# Patient Record
Sex: Male | Born: 1960 | Race: White | Hispanic: No | Marital: Married | Smoking: Former smoker
Health system: Southern US, Community
[De-identification: ages and names within clinical notes are randomized; demographics above are authoritative.]

## PROBLEM LIST (undated history)

## (undated) DIAGNOSIS — K219 Gastro-esophageal reflux disease without esophagitis: Secondary | ICD-10-CM

## (undated) DIAGNOSIS — C61 Malignant neoplasm of prostate: Secondary | ICD-10-CM

## (undated) DIAGNOSIS — G8220 Paraplegia, unspecified: Secondary | ICD-10-CM

## (undated) DIAGNOSIS — N179 Acute kidney failure, unspecified: Secondary | ICD-10-CM

## (undated) DIAGNOSIS — J189 Pneumonia, unspecified organism: Secondary | ICD-10-CM

## (undated) DIAGNOSIS — Z8719 Personal history of other diseases of the digestive system: Secondary | ICD-10-CM

## (undated) DIAGNOSIS — G8929 Other chronic pain: Secondary | ICD-10-CM

## (undated) DIAGNOSIS — M8660 Other chronic osteomyelitis, unspecified site: Secondary | ICD-10-CM

## (undated) DIAGNOSIS — M545 Low back pain, unspecified: Secondary | ICD-10-CM

## (undated) DIAGNOSIS — Z9289 Personal history of other medical treatment: Secondary | ICD-10-CM

## (undated) DIAGNOSIS — G9511 Acute infarction of spinal cord (embolic) (nonembolic): Secondary | ICD-10-CM

## (undated) DIAGNOSIS — F419 Anxiety disorder, unspecified: Secondary | ICD-10-CM

## (undated) DIAGNOSIS — I10 Essential (primary) hypertension: Secondary | ICD-10-CM

## (undated) DIAGNOSIS — I82403 Acute embolism and thrombosis of unspecified deep veins of lower extremity, bilateral: Secondary | ICD-10-CM

## (undated) HISTORY — PX: BACK SURGERY: SHX140

---
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
## 2003-08-15 HISTORY — PX: INSERTION PROSTATE RADIATION SEED: SUR718

## 2006-07-14 HISTORY — PX: LACERATION REPAIR: SHX5168

## 2010-11-13 DIAGNOSIS — J189 Pneumonia, unspecified organism: Secondary | ICD-10-CM

## 2010-11-13 DIAGNOSIS — I82403 Acute embolism and thrombosis of unspecified deep veins of lower extremity, bilateral: Secondary | ICD-10-CM

## 2010-11-13 HISTORY — DX: Acute embolism and thrombosis of unspecified deep veins of lower extremity, bilateral: I82.403

## 2010-11-13 HISTORY — DX: Pneumonia, unspecified organism: J18.9

## 2010-11-13 HISTORY — PX: VENA CAVA FILTER PLACEMENT: SUR1032

## 2011-01-12 DIAGNOSIS — G8254 Quadriplegia, C5-C7 incomplete: Secondary | ICD-10-CM | POA: Insufficient documentation

## 2011-12-26 DIAGNOSIS — G609 Hereditary and idiopathic neuropathy, unspecified: Secondary | ICD-10-CM | POA: Insufficient documentation

## 2012-03-14 HISTORY — PX: PERIPHERALLY INSERTED CENTRAL CATHETER INSERTION: SHX2221

## 2012-05-01 ENCOUNTER — Inpatient Hospital Stay (HOSPITAL_COMMUNITY)
Admission: AD | Admit: 2012-05-01 | Discharge: 2012-05-10 | DRG: 871 | Disposition: A | Discharge status: Home Health | Payer: Medicaid Other | Source: Other Acute Inpatient Hospital | Attending: Internal Medicine | Admitting: Internal Medicine

## 2012-05-01 ENCOUNTER — Encounter (HOSPITAL_COMMUNITY): Payer: Self-pay | Admitting: Internal Medicine

## 2012-05-01 ENCOUNTER — Inpatient Hospital Stay (HOSPITAL_COMMUNITY): Payer: Medicaid Other

## 2012-05-01 DIAGNOSIS — F319 Bipolar disorder, unspecified: Secondary | ICD-10-CM | POA: Diagnosis present

## 2012-05-01 DIAGNOSIS — G9511 Acute infarction of spinal cord (embolic) (nonembolic): Secondary | ICD-10-CM | POA: Insufficient documentation

## 2012-05-01 DIAGNOSIS — IMO0002 Reserved for concepts with insufficient information to code with codable children: Secondary | ICD-10-CM | POA: Diagnosis present

## 2012-05-01 DIAGNOSIS — D72829 Elevated white blood cell count, unspecified: Secondary | ICD-10-CM | POA: Diagnosis present

## 2012-05-01 DIAGNOSIS — R9431 Abnormal electrocardiogram [ECG] [EKG]: Secondary | ICD-10-CM | POA: Diagnosis not present

## 2012-05-01 DIAGNOSIS — Y929 Unspecified place or not applicable: Secondary | ICD-10-CM

## 2012-05-01 DIAGNOSIS — Y92009 Unspecified place in unspecified non-institutional (private) residence as the place of occurrence of the external cause: Secondary | ICD-10-CM

## 2012-05-01 DIAGNOSIS — I959 Hypotension, unspecified: Secondary | ICD-10-CM

## 2012-05-01 DIAGNOSIS — F1011 Alcohol abuse, in remission: Secondary | ICD-10-CM | POA: Diagnosis present

## 2012-05-01 DIAGNOSIS — R0902 Hypoxemia: Secondary | ICD-10-CM | POA: Diagnosis not present

## 2012-05-01 DIAGNOSIS — K592 Neurogenic bowel, not elsewhere classified: Secondary | ICD-10-CM | POA: Diagnosis present

## 2012-05-01 DIAGNOSIS — F1410 Cocaine abuse, uncomplicated: Secondary | ICD-10-CM | POA: Diagnosis present

## 2012-05-01 DIAGNOSIS — E869 Volume depletion, unspecified: Secondary | ICD-10-CM | POA: Diagnosis present

## 2012-05-01 DIAGNOSIS — R7881 Bacteremia: Secondary | ICD-10-CM | POA: Diagnosis present

## 2012-05-01 DIAGNOSIS — T80211A Bloodstream infection due to central venous catheter, initial encounter: Secondary | ICD-10-CM | POA: Diagnosis present

## 2012-05-01 DIAGNOSIS — G8220 Paraplegia, unspecified: Secondary | ICD-10-CM

## 2012-05-01 DIAGNOSIS — T300 Burn of unspecified body region, unspecified degree: Secondary | ICD-10-CM | POA: Diagnosis present

## 2012-05-01 DIAGNOSIS — Z9119 Patient's noncompliance with other medical treatment and regimen: Secondary | ICD-10-CM

## 2012-05-01 DIAGNOSIS — N319 Neuromuscular dysfunction of bladder, unspecified: Secondary | ICD-10-CM | POA: Diagnosis present

## 2012-05-01 DIAGNOSIS — A419 Sepsis, unspecified organism: Principal | ICD-10-CM | POA: Diagnosis present

## 2012-05-01 DIAGNOSIS — T24119A Burn of first degree of unspecified thigh, initial encounter: Secondary | ICD-10-CM | POA: Diagnosis present

## 2012-05-01 DIAGNOSIS — X088XXA Exposure to other specified smoke, fire and flames, initial encounter: Secondary | ICD-10-CM | POA: Diagnosis present

## 2012-05-01 DIAGNOSIS — R110 Nausea: Secondary | ICD-10-CM | POA: Diagnosis not present

## 2012-05-01 DIAGNOSIS — D696 Thrombocytopenia, unspecified: Secondary | ICD-10-CM | POA: Diagnosis present

## 2012-05-01 DIAGNOSIS — Z781 Physical restraint status: Secondary | ICD-10-CM | POA: Diagnosis not present

## 2012-05-01 DIAGNOSIS — R7309 Other abnormal glucose: Secondary | ICD-10-CM | POA: Diagnosis not present

## 2012-05-01 DIAGNOSIS — R509 Fever, unspecified: Secondary | ICD-10-CM | POA: Diagnosis present

## 2012-05-01 DIAGNOSIS — M8618 Other acute osteomyelitis, other site: Secondary | ICD-10-CM | POA: Diagnosis present

## 2012-05-01 DIAGNOSIS — A4902 Methicillin resistant Staphylococcus aureus infection, unspecified site: Secondary | ICD-10-CM | POA: Diagnosis present

## 2012-05-01 DIAGNOSIS — L89109 Pressure ulcer of unspecified part of back, unspecified stage: Secondary | ICD-10-CM | POA: Diagnosis present

## 2012-05-01 DIAGNOSIS — L89154 Pressure ulcer of sacral region, stage 4: Secondary | ICD-10-CM | POA: Diagnosis present

## 2012-05-01 DIAGNOSIS — Z91199 Patient's noncompliance with other medical treatment and regimen due to unspecified reason: Secondary | ICD-10-CM

## 2012-05-01 DIAGNOSIS — Z86718 Personal history of other venous thrombosis and embolism: Secondary | ICD-10-CM

## 2012-05-01 DIAGNOSIS — Z978 Presence of other specified devices: Secondary | ICD-10-CM

## 2012-05-01 DIAGNOSIS — Z7982 Long term (current) use of aspirin: Secondary | ICD-10-CM

## 2012-05-01 DIAGNOSIS — R1310 Dysphagia, unspecified: Secondary | ICD-10-CM | POA: Diagnosis not present

## 2012-05-01 DIAGNOSIS — G8929 Other chronic pain: Secondary | ICD-10-CM | POA: Diagnosis present

## 2012-05-01 DIAGNOSIS — D638 Anemia in other chronic diseases classified elsewhere: Secondary | ICD-10-CM | POA: Diagnosis present

## 2012-05-01 DIAGNOSIS — T368X5A Adverse effect of other systemic antibiotics, initial encounter: Secondary | ICD-10-CM | POA: Diagnosis present

## 2012-05-01 DIAGNOSIS — N179 Acute kidney failure, unspecified: Secondary | ICD-10-CM | POA: Diagnosis present

## 2012-05-01 DIAGNOSIS — F17200 Nicotine dependence, unspecified, uncomplicated: Secondary | ICD-10-CM | POA: Diagnosis present

## 2012-05-01 DIAGNOSIS — K219 Gastro-esophageal reflux disease without esophagitis: Secondary | ICD-10-CM | POA: Diagnosis present

## 2012-05-01 DIAGNOSIS — K5900 Constipation, unspecified: Secondary | ICD-10-CM | POA: Diagnosis not present

## 2012-05-01 DIAGNOSIS — Z791 Long term (current) use of non-steroidal anti-inflammatories (NSAID): Secondary | ICD-10-CM

## 2012-05-01 DIAGNOSIS — D649 Anemia, unspecified: Secondary | ICD-10-CM

## 2012-05-01 DIAGNOSIS — Y9210 Unspecified residential institution as the place of occurrence of the external cause: Secondary | ICD-10-CM | POA: Diagnosis present

## 2012-05-01 DIAGNOSIS — F29 Unspecified psychosis not due to a substance or known physiological condition: Secondary | ICD-10-CM | POA: Diagnosis present

## 2012-05-01 DIAGNOSIS — D509 Iron deficiency anemia, unspecified: Secondary | ICD-10-CM | POA: Diagnosis present

## 2012-05-01 DIAGNOSIS — Z792 Long term (current) use of antibiotics: Secondary | ICD-10-CM

## 2012-05-01 DIAGNOSIS — R1013 Epigastric pain: Secondary | ICD-10-CM | POA: Diagnosis not present

## 2012-05-01 DIAGNOSIS — L8994 Pressure ulcer of unspecified site, stage 4: Secondary | ICD-10-CM | POA: Diagnosis present

## 2012-05-01 DIAGNOSIS — R404 Transient alteration of awareness: Secondary | ICD-10-CM | POA: Diagnosis not present

## 2012-05-01 DIAGNOSIS — F22 Delusional disorders: Secondary | ICD-10-CM | POA: Diagnosis present

## 2012-05-01 DIAGNOSIS — F40298 Other specified phobia: Secondary | ICD-10-CM | POA: Diagnosis present

## 2012-05-01 DIAGNOSIS — B3749 Other urogenital candidiasis: Secondary | ICD-10-CM | POA: Diagnosis present

## 2012-05-01 DIAGNOSIS — M4620 Osteomyelitis of vertebra, site unspecified: Secondary | ICD-10-CM

## 2012-05-01 DIAGNOSIS — Z8739 Personal history of other diseases of the musculoskeletal system and connective tissue: Secondary | ICD-10-CM | POA: Diagnosis present

## 2012-05-01 DIAGNOSIS — Y849 Medical procedure, unspecified as the cause of abnormal reaction of the patient, or of later complication, without mention of misadventure at the time of the procedure: Secondary | ICD-10-CM | POA: Diagnosis present

## 2012-05-01 DIAGNOSIS — R4182 Altered mental status, unspecified: Secondary | ICD-10-CM | POA: Diagnosis not present

## 2012-05-01 DIAGNOSIS — M8668 Other chronic osteomyelitis, other site: Secondary | ICD-10-CM | POA: Diagnosis present

## 2012-05-01 DIAGNOSIS — I69965 Other paralytic syndrome following unspecified cerebrovascular disease, bilateral: Secondary | ICD-10-CM

## 2012-05-01 DIAGNOSIS — M86659 Other chronic osteomyelitis, unspecified thigh: Secondary | ICD-10-CM

## 2012-05-01 HISTORY — DX: Essential (primary) hypertension: I10

## 2012-05-01 HISTORY — DX: Acute kidney failure, unspecified: N17.9

## 2012-05-01 HISTORY — DX: Low back pain, unspecified: M54.50

## 2012-05-01 HISTORY — DX: Paraplegia, unspecified: G82.20

## 2012-05-01 HISTORY — DX: Acute embolism and thrombosis of unspecified deep veins of lower extremity, bilateral: I82.403

## 2012-05-01 HISTORY — DX: Anxiety disorder, unspecified: F41.9

## 2012-05-01 HISTORY — DX: Malignant neoplasm of prostate: C61

## 2012-05-01 HISTORY — DX: Pneumonia, unspecified organism: J18.9

## 2012-05-01 HISTORY — DX: Personal history of other diseases of the digestive system: Z87.19

## 2012-05-01 HISTORY — DX: Acute infarction of spinal cord (embolic) (nonembolic): G95.11

## 2012-05-01 HISTORY — DX: Other chronic osteomyelitis, unspecified site: M86.60

## 2012-05-01 HISTORY — DX: Other chronic pain: G89.29

## 2012-05-01 HISTORY — DX: Low back pain: M54.5

## 2012-05-01 HISTORY — DX: Gastro-esophageal reflux disease without esophagitis: K21.9

## 2012-05-01 HISTORY — DX: Personal history of other medical treatment: Z92.89

## 2012-05-01 LAB — URINE MICROSCOPIC-ADD ON

## 2012-05-01 LAB — CBC WITH DIFFERENTIAL/PLATELET
Basophils Absolute: 0.1 10*3/uL (ref 0.0–0.1)
Basophils Relative: 0 % (ref 0–1)
Eosinophils Absolute: 0.2 10*3/uL (ref 0.0–0.7)
Eosinophils Relative: 2 % (ref 0–5)
HCT: 19.1 % — ABNORMAL LOW (ref 39.0–52.0)
Hemoglobin: 6.7 g/dL — CL (ref 13.0–17.0)
Lymphocytes Relative: 15 % (ref 12–46)
Lymphs Abs: 1.9 10*3/uL (ref 0.7–4.0)
MCH: 26.7 pg (ref 26.0–34.0)
MCHC: 35.1 g/dL (ref 30.0–36.0)
MCV: 76.1 fL — ABNORMAL LOW (ref 78.0–100.0)
Monocytes Absolute: 1.3 10*3/uL — ABNORMAL HIGH (ref 0.1–1.0)
Monocytes Relative: 10 % (ref 3–12)
Neutro Abs: 9.4 10*3/uL — ABNORMAL HIGH (ref 1.7–7.7)
Neutrophils Relative %: 73 % (ref 43–77)
Platelets: 21 10*3/uL — CL (ref 150–400)
RBC: 2.51 MIL/uL — ABNORMAL LOW (ref 4.22–5.81)
RDW: 20.6 % — ABNORMAL HIGH (ref 11.5–15.5)
WBC: 12.8 10*3/uL — ABNORMAL HIGH (ref 4.0–10.5)

## 2012-05-01 LAB — RENAL FUNCTION PANEL
Albumin: 2.9 g/dL — ABNORMAL LOW (ref 3.5–5.2)
BUN: 39 mg/dL — ABNORMAL HIGH (ref 6–23)
CO2: 23 mEq/L (ref 19–32)
Calcium: 9.1 mg/dL (ref 8.4–10.5)
Chloride: 103 mEq/L (ref 96–112)
Creatinine, Ser: 2.55 mg/dL — ABNORMAL HIGH (ref 0.50–1.35)
GFR calc Af Amer: 32 mL/min — ABNORMAL LOW (ref >90)
GFR calc non Af Amer: 28 mL/min — ABNORMAL LOW (ref >90)
Glucose, Bld: 139 mg/dL — ABNORMAL HIGH (ref 70–99)
Phosphorus: 4.5 mg/dL (ref 2.3–4.6)
Potassium: 3.7 mEq/L (ref 3.5–5.1)
Sodium: 138 mEq/L (ref 135–145)

## 2012-05-01 LAB — PREPARE RBC (CROSSMATCH)

## 2012-05-01 LAB — SODIUM, URINE, RANDOM: Sodium, Ur: 62 mEq/L

## 2012-05-01 LAB — CREATININE, URINE, RANDOM: Creatinine, Urine: 78.32 mg/dL

## 2012-05-01 LAB — URINALYSIS, ROUTINE W REFLEX MICROSCOPIC
Bilirubin Urine: NEGATIVE
Glucose, UA: NEGATIVE mg/dL
Ketones, ur: NEGATIVE mg/dL
Nitrite: NEGATIVE
Protein, ur: >300 mg/dL — AB
Specific Gravity, Urine: 1.017 (ref 1.005–1.030)
Urobilinogen, UA: 0.2 mg/dL (ref 0.0–1.0)
pH: 6 (ref 5.0–8.0)

## 2012-05-01 LAB — RETICULOCYTES
RBC.: 2.91 MIL/uL — ABNORMAL LOW (ref 4.22–5.81)
Retic Count, Absolute: 84.4 10*3/uL (ref 19.0–186.0)
Retic Ct Pct: 2.9 % (ref 0.4–3.1)

## 2012-05-01 LAB — IRON AND TIBC
Iron: 13 ug/dL — ABNORMAL LOW (ref 42–135)
Saturation Ratios: 7 % — ABNORMAL LOW (ref 20–55)
TIBC: 188 ug/dL — ABNORMAL LOW (ref 215–435)
UIBC: 175 ug/dL (ref 125–400)

## 2012-05-01 LAB — ABO/RH: ABO/RH(D): B POS

## 2012-05-01 LAB — VANCOMYCIN, RANDOM: Vancomycin Rm: 21.1 ug/mL

## 2012-05-01 LAB — OSMOLALITY, URINE: Osmolality, Ur: 415 mOsm/kg (ref 390–1090)

## 2012-05-01 MED ORDER — HYDROMORPHONE HCL PF 1 MG/ML IJ SOLN
1.0000 mg | INTRAMUSCULAR | Status: DC | PRN
  Administered 2012-05-01 – 2012-05-07 (×27): 1 mg via INTRAVENOUS
  Filled 2012-05-01 (×30): qty 1

## 2012-05-01 MED ORDER — OXYCODONE HCL 5 MG PO TABS
5.0000 mg | ORAL_TABLET | ORAL | Status: DC | PRN
  Administered 2012-05-01 – 2012-05-02 (×2): 5 mg via ORAL
  Filled 2012-05-01 (×2): qty 1

## 2012-05-01 MED ORDER — CARISOPRODOL 350 MG PO TABS
350.0000 mg | ORAL_TABLET | Freq: Three times a day (TID) | ORAL | Status: DC | PRN
  Administered 2012-05-04 – 2012-05-08 (×4): 350 mg via ORAL
  Filled 2012-05-01 (×5): qty 1

## 2012-05-01 MED ORDER — SENNA 8.6 MG PO TABS
1.0000 | ORAL_TABLET | Freq: Two times a day (BID) | ORAL | Status: DC
  Administered 2012-05-01 – 2012-05-10 (×10): 8.6 mg via ORAL
  Filled 2012-05-01 (×19): qty 1

## 2012-05-01 MED ORDER — SODIUM CHLORIDE 0.9 % IV SOLN
INTRAVENOUS | Status: DC
  Administered 2012-05-01: 75 mL/h via INTRAVENOUS
  Administered 2012-05-02 – 2012-05-05 (×4): via INTRAVENOUS

## 2012-05-01 MED ORDER — SODIUM CHLORIDE 0.9 % IV SOLN
420.0000 mg | INTRAVENOUS | Status: DC
  Administered 2012-05-01 – 2012-05-09 (×5): 420 mg via INTRAVENOUS
  Filled 2012-05-01 (×10): qty 8.4

## 2012-05-01 MED ORDER — SODIUM CHLORIDE 0.9 % IJ SOLN
10.0000 mL | INTRAMUSCULAR | Status: DC | PRN
  Administered 2012-05-01: 30 mL
  Administered 2012-05-02 – 2012-05-10 (×7): 10 mL
  Filled 2012-05-01: qty 30
  Filled 2012-05-01: qty 40
  Filled 2012-05-01: qty 10

## 2012-05-01 MED ORDER — SODIUM CHLORIDE 0.9 % IV BOLUS (SEPSIS)
1000.0000 mL | Freq: Once | INTRAVENOUS | Status: AC
  Administered 2012-05-01: 1000 mL via INTRAVENOUS

## 2012-05-01 MED ORDER — DOCUSATE SODIUM 100 MG PO CAPS
100.0000 mg | ORAL_CAPSULE | Freq: Two times a day (BID) | ORAL | Status: DC
  Administered 2012-05-01 – 2012-05-10 (×12): 100 mg via ORAL
  Filled 2012-05-01 (×21): qty 1

## 2012-05-01 MED ORDER — PANTOPRAZOLE SODIUM 40 MG PO TBEC
80.0000 mg | DELAYED_RELEASE_TABLET | Freq: Every day | ORAL | Status: DC
  Administered 2012-05-02 – 2012-05-10 (×6): 80 mg via ORAL
  Filled 2012-05-01 (×7): qty 2

## 2012-05-01 MED ORDER — MORPHINE SULFATE 4 MG/ML IJ SOLN
4.0000 mg | INTRAMUSCULAR | Status: DC | PRN
  Administered 2012-05-01 – 2012-05-02 (×2): 4 mg via INTRAVENOUS
  Filled 2012-05-01 (×2): qty 1

## 2012-05-01 NOTE — Progress Notes (Signed)
CRITICAL VALUE ALERT  Critical value received:  Hemoglobin 6.7  Date of notification:  05/01/12  Time of notification: 0648 Critical value read back:yes  Nurse who received alert:  Richardean Canal, RN  MD notified (1st page): Dr. Ashley Royalty  Time of first page:  [phone]  MD notified (2nd page):  Time of second page:  Responding MD: Dr. Ashley Royalty  Time MD responded:  [phone]
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
## 2012-05-01 NOTE — Consult Note (Signed)
[facility] CONSULT NOTE    Date: 05/01/2012                  Patient Name:  Clarence Sims  MRN: [id number]  DOB: [birth year]  Age / Sex: 51 y.o., male         PCP: Paulina Fusi, MD                 Service Requesting Consult: Dr. Ashley Royalty- [facility]                 Reason for Consult: Acute Renal Failure            History of Present Illness: Patient is a 51 y.o. male with a PMHx of depression and HTN who was transferred to [facility] on 05/01/2012 for evaluation of worsening renal function after being treated for fever/sepsis, osteomyelitis, sacral wound, paraplegia, and chronic pain at [facility]. Patient has been on IV abx (Vanc) via PICC line which was removed at [place] last week since patient was febrile. (see more history below.) The line culture grew MRSA. Per nursing report, patient's creatinine was 1.6 on 9/10 and trended up to 2.6 today. Patient also has anemia and is s/p 4 units PRBC (*per patient's report) for Hgb drop from 9.1 to 7.3 last week. After PICC was pulled, patient had a triple lumen IJ placed on Friday 04/26/12. (Pt refused [facility] Cath.) He also has chronic foley, which he has had since May. Current foley was placed on 04/23/12.  On brief review of his history, patient states on November 08, 2010 he had outpatient spinal surgery of cervical spine and then had "spinal stroke" a few days later. Had residual weakness but patient underwent extensive rehab and was doing well. He started having problems again in May 2013 when he had increased weakness which became progressively worse. He was found to have osteomyelitis of the vertebrae. Pt states his paraplegia is from nerve damage from pressure on the spine due to the infection. Patient has been on Vancomycin for 30 days.  Recently, he started having fevers again on Sept 10, 2013. Patient went to [facility] for further evaluation of his fevers and was treated for osteomyelitis as well as  MRSA bacteria (noted above). He was transferred to [facility] for evaluation of renal function and anemia.  Patient states he is making a normal amount of urine. He is taking good PO intake. He does report diffuse pain of his back and abdomen as well as mild swelling of his abdomen. Patient denies issues right now except for thirst.  In trying to piece info together from [facility] we know kidney function was relatively normal about 6 weeks ago.  He has had a decline but not sure of the speed with a creatinine of 2.6 today.  Of note, vanc level was reportedly high, he has had polymicrobial sepsis and looks to me to be slightly volume depleted with a lowish BP  Medications: Outpatient medications: No prescriptions prior to admission    Current medications: Current Facility-Administered Medications  Medication Dose Route Frequency Provider Last Rate Last Dose  . carisoprodol (SOMA) tablet 350 mg  350 mg Oral TID PRN Altha Harm, MD      . docusate sodium (COLACE) capsule 100 mg  100 mg Oral BID Altha Harm, MD      . morphine 4 MG/ML injection 4 mg  4 mg Intravenous Q4H PRN Altha Harm, MD      .  oxyCODONE (Oxy IR/ROXICODONE) immediate release tablet 5 mg  5 mg Oral Q4H PRN Altha Harm, MD      . pantoprazole (PROTONIX) EC tablet 80 mg  80 mg Oral Q1200 Altha Harm, MD      . senna (SENOKOT) tablet 8.6 mg  1 tablet Oral BID Altha Harm, MD         Allergies: No Known Allergies   Past Medical History: HTN, depression  Past Surgical History: No past surgical history on file.  Family History: No pertinent Family History  Social History: History   Social History  . Marital Status: Married    Spouse Name: N/A    Number of Children: N/A  . Years of Education: N/A   Occupational History  . Not on file.   Social History Main Topics  . Smoking status: Not on file  . Smokeless tobacco: Not on file  . Alcohol Use: Not on file    . Drug Use: Not on file  . Sexually Active: Not on file   Other Topics Concern  . Not on file   Social History Narrative  . No narrative on file   Review of Systems: As per HPI.  No HA, CP, SOB +Blurred vision, abdominal pain, pain in back. Reports some swelling  Vital Signs: Blood pressure 99/66, pulse 104, temperature 98.2 F (36.8 C), temperature source Oral, resp. rate 19, SpO2 100.00%.  Weight trends: There were no vitals filed for this visit.  Physical Exam: General: Vital signs reviewed and noted. Well-developed, well-nourished, in no acute distress; alert, appropriate and cooperative throughout examination.  Head: Normocephalic, atraumatic.   Eyes:  EOMI, No signs of anemia or jaundice.  Nose: Mucous membranes moist. ?dry lips  Neck: No deformities. Right IJ in place  Lungs:  Normal respiratory effort. Clear to auscultation BL without crackles or wheezes.  Heart: RRR. S1 and S2 normal without gallop, murmur, or rubs.  Abdomen:  BS normoactive. Soft, Nondistended. TTP upper quadrants (no sensation below umbilicus). Exquisite TTP RUQ  Extremities: No pretibial edema. Good ROM of upper extremities  Neurologic: A&O X3, CN II - XII are grossly intact. Good strength upper extremities. Unable to move lower extremities. Sensation also decreased, feels "pressure" . TTP of lumbar spine.  Skin: Right decub ulcer with wound vac over wound. Some stool noted in dressing.   Lab results: PENDING  Imaging: None  Assessment & Plan: Pt is a 51 y.o. yo male with a PMHx of Depression, anxiety, HTN, GERD, PNA, Osteo, Prostate cancer, neurogenic bladder and UTI, who was transferred to [facility] on 05/01/2012 for evaluation of worsening renal function after being treated for fever/sepsis, osteomyelitis, sacral wound, paraplegia, and chronic pain at [facility].  ARF- Patient has normal renal function at baseline with a Creatinine of 0.9 in early August 2013. Differential diagnoses for ARF  include Vancomycin toxicity after extended use, volume depletion, UTI and sepsis. We are awaiting baseline labs since arrival to [facility]. Creat on day of transfer at [facility] [place] was 2.6. Electrolytes were relatively unremarkable at time of transfer as well. Currently no signs of uremia.  - Awaiting labs now still pending - Renal function and CBC in the morning - Strict I/O; PO ad lib - Foley has been in place since Sept 10. UA collection, and will need foley replaced soon - We will give IVF at 75 cc/hr since patient is slightly dry clinically - Awaiting Vanc level - Follow vital signs as he is slightly  hypotensive at this time  Anemia- Patient reports 4 units PRBC transfused (2 units reported) and remains anemic. - CBC now - Iron panel now - Monitor for signs of bleeding- will likely give iron/aranesp as needed  Infection- Osteomyelitis, bacteremia and sacral ulcer. No longer on Vanc, transitioned to Daptomycin prior to transfer. - Per primary team - Wound care consult for sacral ulcer (has wound vac)  DVT PPX - SCD  Thank you for this interesting consult.  Amber M. Hairford, M.D. 05/01/2012 3:42 PM   Patient seen and examined, agree with above note with above modifications.  51 year old WM with paraplegia and neurogenic bladder with recent ARF at OSH in the setting of high vanc level, polymicrobial sepsis and low BP.  Currently awaiting labs and will act as needed.  No indications for HD based on labs from [facility] today.  Annie Sable, MD 05/01/2012
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
## 2012-05-02 ENCOUNTER — Inpatient Hospital Stay (HOSPITAL_COMMUNITY): Payer: Medicaid Other

## 2012-05-02 DIAGNOSIS — D649 Anemia, unspecified: Secondary | ICD-10-CM

## 2012-05-02 DIAGNOSIS — R7881 Bacteremia: Secondary | ICD-10-CM

## 2012-05-02 DIAGNOSIS — N179 Acute kidney failure, unspecified: Secondary | ICD-10-CM

## 2012-05-02 DIAGNOSIS — T827XXA Infection and inflammatory reaction due to other cardiac and vascular devices, implants and grafts, initial encounter: Secondary | ICD-10-CM

## 2012-05-02 DIAGNOSIS — L8994 Pressure ulcer of unspecified site, stage 4: Secondary | ICD-10-CM

## 2012-05-02 DIAGNOSIS — L89109 Pressure ulcer of unspecified part of back, unspecified stage: Secondary | ICD-10-CM

## 2012-05-02 DIAGNOSIS — D696 Thrombocytopenia, unspecified: Secondary | ICD-10-CM

## 2012-05-02 LAB — CBC
HCT: 20.1 % — ABNORMAL LOW (ref 39.0–52.0)
Hemoglobin: 7 g/dL — ABNORMAL LOW (ref 13.0–17.0)
MCH: 27.1 pg (ref 26.0–34.0)
MCHC: 34.8 g/dL (ref 30.0–36.0)
MCV: 77.9 fL — ABNORMAL LOW (ref 78.0–100.0)
Platelets: 17 10*3/uL — CL (ref 150–400)
RBC: 2.58 MIL/uL — ABNORMAL LOW (ref 4.22–5.81)
RDW: 20.2 % — ABNORMAL HIGH (ref 11.5–15.5)
WBC: 11.2 10*3/uL — ABNORMAL HIGH (ref 4.0–10.5)

## 2012-05-02 LAB — RENAL FUNCTION PANEL
Albumin: 2.7 g/dL — ABNORMAL LOW (ref 3.5–5.2)
BUN: 39 mg/dL — ABNORMAL HIGH (ref 6–23)
CO2: 22 mEq/L (ref 19–32)
Calcium: 8.8 mg/dL (ref 8.4–10.5)
Chloride: 102 mEq/L (ref 96–112)
Creatinine, Ser: 2.65 mg/dL — ABNORMAL HIGH (ref 0.50–1.35)
GFR calc Af Amer: 31 mL/min — ABNORMAL LOW (ref >90)
GFR calc non Af Amer: 27 mL/min — ABNORMAL LOW (ref >90)
Glucose, Bld: 114 mg/dL — ABNORMAL HIGH (ref 70–99)
Phosphorus: 4.9 mg/dL — ABNORMAL HIGH (ref 2.3–4.6)
Potassium: 3.8 mEq/L (ref 3.5–5.1)
Sodium: 136 mEq/L (ref 135–145)

## 2012-05-02 LAB — HEPATITIS PANEL, ACUTE
HCV Ab: NEGATIVE
Hep A IgM: NEGATIVE
Hep B C IgM: NEGATIVE
Hepatitis B Surface Ag: NEGATIVE

## 2012-05-02 LAB — DIFFERENTIAL
Basophils Absolute: 0 10*3/uL (ref 0.0–0.1)
Basophils Relative: 0 % (ref 0–1)
Eosinophils Absolute: 0.3 10*3/uL (ref 0.0–0.7)
Eosinophils Relative: 2 % (ref 0–5)
Lymphocytes Relative: 20 % (ref 12–46)
Lymphs Abs: 2.2 10*3/uL (ref 0.7–4.0)
Monocytes Absolute: 1.2 10*3/uL — ABNORMAL HIGH (ref 0.1–1.0)
Monocytes Relative: 11 % (ref 3–12)
Neutro Abs: 7.5 10*3/uL (ref 1.7–7.7)
Neutrophils Relative %: 67 % (ref 43–77)

## 2012-05-02 LAB — RPR: RPR Ser Ql: NONREACTIVE

## 2012-05-02 LAB — URINE CULTURE
Colony Count: NO GROWTH
Culture: NO GROWTH

## 2012-05-02 LAB — HIV ANTIBODY (ROUTINE TESTING W REFLEX): HIV: NONREACTIVE

## 2012-05-02 LAB — FOLATE: Folate: >20 ng/mL

## 2012-05-02 LAB — FERRITIN: Ferritin: 1296 ng/mL — ABNORMAL HIGH (ref 22–322)

## 2012-05-02 LAB — CK: Total CK: 85 U/L (ref 7–232)

## 2012-05-02 LAB — VITAMIN B12: Vitamin B-12: 308 pg/mL (ref 211–911)

## 2012-05-02 MED ORDER — ACETAMINOPHEN 325 MG PO TABS
650.0000 mg | ORAL_TABLET | ORAL | Status: DC | PRN
  Administered 2012-05-03: 650 mg via ORAL
  Filled 2012-05-02: qty 2

## 2012-05-02 MED ORDER — ASPIRIN EC 81 MG PO TBEC
81.0000 mg | DELAYED_RELEASE_TABLET | Freq: Every day | ORAL | Status: DC
  Administered 2012-05-02 – 2012-05-10 (×6): 81 mg via ORAL
  Filled 2012-05-02 (×9): qty 1

## 2012-05-02 MED ORDER — BACITRACIN-NEOMYCIN-POLYMYXIN OINTMENT TUBE
TOPICAL_OINTMENT | Freq: Every day | CUTANEOUS | Status: DC
  Administered 2012-05-02 – 2012-05-10 (×8): via TOPICAL
  Filled 2012-05-02: qty 15

## 2012-05-02 MED ORDER — ENSURE COMPLETE PO LIQD
237.0000 mL | Freq: Two times a day (BID) | ORAL | Status: DC
  Administered 2012-05-02 – 2012-05-04 (×4): 237 mL via ORAL
  Administered 2012-05-08: 120 mL via ORAL
  Administered 2012-05-09 – 2012-05-10 (×2): 237 mL via ORAL

## 2012-05-02 MED ORDER — JUVEN PO PACK
1.0000 | PACK | Freq: Two times a day (BID) | ORAL | Status: DC
  Administered 2012-05-02 – 2012-05-10 (×7): 1 via ORAL
  Filled 2012-05-02 (×17): qty 1

## 2012-05-02 MED ORDER — DIPHENHYDRAMINE HCL 25 MG PO CAPS: 25.0000 mg | ORAL_CAPSULE | Freq: Three times a day (TID) | ORAL | Status: DC | PRN

## 2012-05-02 MED ORDER — PREGABALIN 100 MG PO CAPS
100.0000 mg | ORAL_CAPSULE | Freq: Three times a day (TID) | ORAL | Status: DC
  Administered 2012-05-02 – 2012-05-03 (×3): 100 mg via ORAL
  Filled 2012-05-02 (×4): qty 1

## 2012-05-02 MED ORDER — OXYCODONE HCL 5 MG PO TABS
10.0000 mg | ORAL_TABLET | ORAL | Status: DC | PRN
  Administered 2012-05-05: 10 mg via ORAL
  Filled 2012-05-02: qty 2

## 2012-05-02 MED ORDER — LORAZEPAM 2 MG/ML IJ SOLN
1.0000 mg | Freq: Once | INTRAMUSCULAR | Status: AC
  Administered 2012-05-02: 1 mg via INTRAVENOUS
  Filled 2012-05-02 (×2): qty 1

## 2012-05-02 MED ORDER — ONDANSETRON HCL 4 MG PO TABS: 4.0000 mg | ORAL_TABLET | Freq: Three times a day (TID) | ORAL | Status: DC | PRN

## 2012-05-02 MED ORDER — ADULT MULTIVITAMIN W/MINERALS CH
1.0000 | ORAL_TABLET | Freq: Every day | ORAL | Status: DC
  Administered 2012-05-02 – 2012-05-10 (×6): 1 via ORAL
  Filled 2012-05-02 (×9): qty 1

## 2012-05-02 MED ORDER — PIPERACILLIN-TAZOBACTAM 3.375 G IVPB
3.3750 g | Freq: Three times a day (TID) | INTRAVENOUS | Status: DC
  Administered 2012-05-02 – 2012-05-03 (×4): 3.375 g via INTRAVENOUS
  Filled 2012-05-02 (×6): qty 50

## 2012-05-02 MED ORDER — MORPHINE SULFATE ER 100 MG PO TBCR
100.0000 mg | EXTENDED_RELEASE_TABLET | Freq: Two times a day (BID) | ORAL | Status: DC
  Filled 2012-05-02 (×2): qty 1

## 2012-05-02 MED ORDER — DIPHENHYDRAMINE HCL 25 MG PO CAPS
25.0000 mg | ORAL_CAPSULE | ORAL | Status: DC | PRN
  Administered 2012-05-02: 25 mg via ORAL
  Filled 2012-05-02: qty 1

## 2012-05-02 MED ORDER — FLUCONAZOLE 200 MG PO TABS
200.0000 mg | ORAL_TABLET | Freq: Every day | ORAL | Status: AC
  Administered 2012-05-02 – 2012-05-04 (×3): 200 mg via ORAL
  Filled 2012-05-02 (×3): qty 1

## 2012-05-02 MED ORDER — ONDANSETRON HCL 4 MG/2ML IJ SOLN
4.0000 mg | Freq: Four times a day (QID) | INTRAMUSCULAR | Status: DC | PRN
  Administered 2012-05-02 – 2012-05-04 (×2): 4 mg via INTRAVENOUS
  Filled 2012-05-02 (×2): qty 2

## 2012-05-02 NOTE — Progress Notes (Signed)
Blood end time 0915.

## 2012-05-02 NOTE — Progress Notes (Signed)
INFECTIOUS DISEASE PROGRESS NOTE  ID: Clarence Sims is a 51 y.o. male with   Active Problems:  Bacteremia associated with intravascular line  Paraplegia following spinal cord injury  Neurogenic bladder  Neurogenic bowel  Sacral decubitus ulcer, stage IV  Osteomyelitis, chronic, pelvic region  Acute renal failure  Anemia  Thrombocytopenia  Fever  GERD (gastroesophageal reflux disease)  Chronic indwelling foley catheter  Leukocytosis  Burn erythema  Subjective: C/o feeling poorly, pain, gi upset Agrees to have MRI now  Abtx:  Anti-infectives     Start     Dose/Rate Route Frequency Ordered Stop   05/02/12 1100  piperacillin-tazobactam (ZOSYN) IVPB 3.375 g       3.375 g 12.5 mL/hr over 240 Minutes Intravenous Every 8 hours 05/02/12 1039     05/02/12 1100   fluconazole (DIFLUCAN) tablet 200 mg        200 mg Oral Daily 05/02/12 1044 05/05/12 0959   05/01/12 1800   DAPTOmycin (CUBICIN) 420 mg in sodium chloride 0.9 % IVPB        420 mg 216.8 mL/hr over 30 Minutes Intravenous Every 48 hours 05/01/12 1624            Medications:  Scheduled:   . aspirin EC  81 mg Oral Daily  . DAPTOmycin (CUBICIN)  IV  420 mg Intravenous Q48H  . docusate sodium  100 mg Oral BID  . feeding supplement  237 mL Oral BID BM  . fluconazole  200 mg Oral Daily  . LORazepam  1 mg Intravenous Once  . morphine  100 mg Oral Q12H  . multivitamin with minerals  1 tablet Oral Daily  . neomycin-bacitracin-polymyxin   Topical Daily  . nutrition supplement  1 packet Oral BID BM  . pantoprazole  80 mg Oral Q1200  . piperacillin-tazobactam (ZOSYN)  IV  3.375 g Intravenous Q8H  . pregabalin  100 mg Oral TID  . senna  1 tablet Oral BID  . sodium chloride  1,000 mL Intravenous Once    Objective: Vital signs in last 24 hours: Temp:  [98 F (36.7 C)-100.3 F (37.9 C)] 98 F (36.7 C) (09/19 1415) Pulse Rate:  [93-117] 93  (09/19 1415) Resp:  [14-22] 18  (09/19 1415) BP: (92-116)/(56-83)  109/70 mmHg (09/19 1415) SpO2:  [95 %-100 %] 95 % (09/19 1415) Weight:  [68.992 kg (152 lb 1.6 oz)] 68.992 kg (152 lb 1.6 oz) (09/18 2134)   General appearance: alert, cooperative and no distress Resp: clear to auscultation bilaterally Cardio: regular rate and rhythm GI: normal findings: bowel sounds normal and soft, non-tender  Lab Results  Basename 05/02/12 0500 05/01/12 1745  WBC 11.2* 12.8*  HGB 7.0* 6.7*  HCT 20.1* 19.1*  NA 136 138  K 3.8 3.7  CL 102 103  CO2 22 23  BUN 39* 39*  CREATININE 2.65* 2.55*  GLU -- --   Liver Panel  Basename 05/02/12 0500 05/01/12 1745  PROT -- --  ALBUMIN 2.7* 2.9*  AST -- --  ALT -- --  ALKPHOS -- --  BILITOT -- --  BILIDIR -- --  IBILI -- --   Sedimentation Rate No results found for this basename: ESRSEDRATE in the last 72 hours C-Reactive Protein No results found for this basename: CRP:2 in the last 72 hours  Microbiology: No results found for this or any previous visit (from the past 240 hour(s)).  Studies/Results: Dg Chest Port 1 View  05/01/2012  *RADIOLOGY REPORT*  Clinical Data: Central  line placement.  PORTABLE CHEST - 1 VIEW  Comparison: 04/27/2012.  Findings: Right central line tip distal superior vena cava level. No gross pneumothorax.  Central pulmonary vascular prominence.  Tortuous aorta.  Heart size within normal limits.  IMPRESSION: Right central line tip distal superior vena cava level.   Original Report Authenticated By: Fuller Canada, M.D.      Assessment/Plan: Sacral decubitus Prev c-spine surgery,  Infection of previous surgical site ARF funguria  Total days of antibiotics 32 Would continue zosyn/cubicin while we await his MRI's to see what his prev osteo looks like.  Appreciate WOC eval and nutrition eval.  don't typically treat funguria but will defer to Dr Lynda Rainwater His repeat Cx here are pending.   [name] Infectious Diseases [phone] 05/02/2012, 3:58 PM   LOS: 1 day
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
## 2012-05-02 NOTE — Progress Notes (Signed)
Plasma start time 1020

## 2012-05-02 NOTE — Progress Notes (Signed)
Daily Renal Progress Note  Subjective:   Patient states he did not have a restful night. Reports some dyspnea and difficulty swallowing overnight, but that has resolved. Continues to have pain. No signs of bleeding. Continues to have urine output. Right this moment complaining of itching and nausea as well as what sounds like heartburn   Objective:   BP 109/72  Pulse 104  Temp 99.2 F (37.3 C) (Oral)  Resp 15  Wt 152 lb 1.6 oz (68.992 kg)  SpO2 100%  Intake/Output Summary (Last 24 hours) at 05/02/12 0748 Last data filed at 05/02/12 0657  Gross per 24 hour  Intake   1280 ml  Output    675 ml  Net    605 ml   Weight change:   Physical Exam: Gen: In bed, NAD. Awake and talkative. Family members at bedside Neck: RIJ in place, currently receiving blood transfusion CVS: Tachycardic, regular rhythm  Resp: Normal work of breathing, no wheezes Abd: Soft, nontender when patient is distracted Ext: No edema. Neuro: Does not move lower extremities.  Imaging: Dg Chest Port 1 View  05/01/2012  *RADIOLOGY REPORT*  Clinical Data: Central line placement.  PORTABLE CHEST - 1 VIEW  Comparison: 04/27/2012.  Findings: Right central line tip distal superior vena cava level. No gross pneumothorax.  Central pulmonary vascular prominence.  Tortuous aorta.  Heart size within normal limits.  IMPRESSION: Right central line tip distal superior vena cava level.   Original Report Authenticated By: Fuller Canada, M.D.    Labs: BMET  Lab 05/02/12 0500 05/01/12 1745  NA 136 138  K 3.8 3.7  CL 102 103  CO2 22 23  GLUCOSE 114* 139*  BUN 39* 39*  CREATININE 2.65* 2.55*  ALB -- --  CALCIUM 8.8 9.1  PHOS 4.9* 4.5   CBC  Lab 05/02/12 0500 05/01/12 1745  WBC 11.2* 12.8*  NEUTROABS 7.5 9.4*  HGB 7.0* 6.7*  HCT 20.1* 19.1*  MCV 77.9* 76.1*  PLT 17* 21*    Medications:      . DAPTOmycin (CUBICIN)  IV  420 mg Intravenous Q48H  . docusate sodium  100 mg Oral BID  . pantoprazole  80 mg Oral  Q1200  . senna  1 tablet Oral BID  . sodium chloride  1,000 mL Intravenous Once     Assessment/ Plan:   Pt is a 51 y.o. yo male with a PMHx of Depression, anxiety, HTN, GERD, PNA, Osteo, Prostate cancer, neurogenic bladder and UTI, who was transferred to [facility] on 05/01/2012 for evaluation of worsening renal function after being treated for fever/sepsis, osteomyelitis, sacral wound, paraplegia, and chronic pain at [facility].   ARF- Patient has normal renal function at baseline with a Creatinine of 0.9 in early August 2013. Differential diagnoses for ARF include Vancomycin toxicity after extended use, volume depletion, UTI and sepsis.  Electrolytes were relatively unremarkable at time of transfer as well. Currently no signs of uremia.  - Labs yesterday showed Cr of 2.55, and this morning 2.65- looks like just slow climb during hosp - Strict I/O; PO ad lib  - Foley has been in place since Sept 10. (Does have chronic foley from neurogenic bladder) - Continue IVF at 75 cc/hr since patient is still slightly dry clinically  - Repeat labs in the AM - Will continue to trend Cr, but not acute indication for intervention at this time.  Given 7-10 WBC and yeast, would like to give 3 days of diflucan and recheck U/ A  Anemia- Patient reports 4 units PRBC transfused at OSH (2 units reported) and remains anemic. HgB 6.7 yesterday and transfused 2 units overnight. HgB this morning 7.0 with a platelet count of 17 - Iron panel shows low iron stores. Will consider giving iron as well - Monitor for signs of bleeding not sure etiology of thrombocytopenia and severe anemia- apparently HIT was negative at [place].  Per primary team transfuse as needed  Infection- Osteomyelitis, bacteremia and sacral ulcer. No longer on Vanc, transitioned to Daptomycin prior to transfer.  - Per primary team  - Wound care consult for sacral ulcer (has wound vac)  - Continue Dapto and Zosyn, per ID - MR of spine, but would  continue to avoid contrast studies given renal function.  DVT PPX - SCD   Amber M. Hairford, M.D. 05/02/2012, 7:48 AM   Patient seen and examined, agree with above note with above modifications. 51 year old WM with ARF in the setting of UTI/sepsis/supratherapeutic vanc/and volume depletion.  Continue current management of IVF, will add diflucan, vanc has been discontinued.  Non oliguric.  No need for HD at this time Annie Sable, MD 05/02/2012
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
## 2012-05-02 NOTE — Consult Note (Signed)
WOC consult Note Reason for Consult:Consult requested for chronic sacral wound. Pt was admitted with home wound vac.  Family states they have the machine in the car at this time.Currently, wet to dry dressing is intact to sacrum.  Pt is preparing to go to MRI at this time. Dr Ashley Royalty in room.   Wound type:Stage 4 Pressure Ulcer POA: Yes Measurement:5X3X4cm, bone palpable with swab, undermining to wound edges approx 2cm Wound ZOX:WRUEA red Drainage (amount, consistency, odor) Mod green drainage with odor Periwound:Intact surrounding Dressing procedure/placement/frequency:Will re-apply vac when MRI results are available.  Vac is contra-indicated for untreated osteomyelitis.  Wet to dry dressing is in place at this time.  Pt on low-air loss overlay to decrease pressure. If MRI shows osteomyelitis, then a surgical or ortho consult wound necessary, as this is beyond [facility] scope of practice.   Cammie Mcgee, RN, MSN, [employer]  [phone]

## 2012-05-03 ENCOUNTER — Inpatient Hospital Stay (HOSPITAL_COMMUNITY): Payer: Medicaid Other

## 2012-05-03 DIAGNOSIS — A419 Sepsis, unspecified organism: Principal | ICD-10-CM

## 2012-05-03 DIAGNOSIS — R6520 Severe sepsis without septic shock: Secondary | ICD-10-CM

## 2012-05-03 LAB — CBC
HCT: 22.9 % — ABNORMAL LOW (ref 39.0–52.0)
Hemoglobin: 7.7 g/dL — ABNORMAL LOW (ref 13.0–17.0)
MCH: 27 pg (ref 26.0–34.0)
MCHC: 33.6 g/dL (ref 30.0–36.0)
MCV: 80.4 fL (ref 78.0–100.0)
Platelets: 45 10*3/uL — ABNORMAL LOW (ref 150–400)
RBC: 2.85 MIL/uL — ABNORMAL LOW (ref 4.22–5.81)
RDW: 19.9 % — ABNORMAL HIGH (ref 11.5–15.5)
WBC: 11.3 10*3/uL — ABNORMAL HIGH (ref 4.0–10.5)

## 2012-05-03 LAB — PROCALCITONIN: Procalcitonin: 26.57 ng/mL

## 2012-05-03 LAB — RENAL FUNCTION PANEL
Albumin: 2.9 g/dL — ABNORMAL LOW (ref 3.5–5.2)
BUN: 37 mg/dL — ABNORMAL HIGH (ref 6–23)
CO2: 24 mEq/L (ref 19–32)
Calcium: 8.9 mg/dL (ref 8.4–10.5)
Chloride: 107 mEq/L (ref 96–112)
Creatinine, Ser: 2.69 mg/dL — ABNORMAL HIGH (ref 0.50–1.35)
GFR calc Af Amer: 30 mL/min — ABNORMAL LOW (ref >90)
GFR calc non Af Amer: 26 mL/min — ABNORMAL LOW (ref >90)
Glucose, Bld: 95 mg/dL (ref 70–99)
Phosphorus: 5.9 mg/dL — ABNORMAL HIGH (ref 2.3–4.6)
Potassium: 4.3 mEq/L (ref 3.5–5.1)
Sodium: 142 mEq/L (ref 135–145)

## 2012-05-03 LAB — PREPARE PLATELET PHERESIS: Unit division: 0

## 2012-05-03 LAB — LACTIC ACID, PLASMA: Lactic Acid, Venous: 1.8 mmol/L (ref 0.5–2.2)

## 2012-05-03 MED ORDER — SODIUM CHLORIDE 0.9 % IV SOLN
25.0000 mg | Freq: Once | INTRAVENOUS | Status: DC
  Filled 2012-05-03: qty 0.5

## 2012-05-03 MED ORDER — MORPHINE SULFATE ER 15 MG PO TBCR
60.0000 mg | EXTENDED_RELEASE_TABLET | Freq: Two times a day (BID) | ORAL | Status: DC
  Administered 2012-05-03: 60 mg via ORAL
  Filled 2012-05-03: qty 4

## 2012-05-03 MED ORDER — PREGABALIN 50 MG PO CAPS: 100.0000 mg | ORAL_CAPSULE | Freq: Three times a day (TID) | ORAL | Status: DC

## 2012-05-03 MED ORDER — OXYMORPHONE HCL ER 10 MG PO T12A
40.0000 mg | EXTENDED_RELEASE_TABLET | Freq: Three times a day (TID) | ORAL | Status: DC
  Administered 2012-05-03 – 2012-05-04 (×2): 40 mg via ORAL
  Filled 2012-05-03 (×2): qty 4

## 2012-05-03 MED ORDER — IRON DEXTRAN 50 MG/ML IJ SOLN: 1000.0000 mg | Freq: Once | INTRAMUSCULAR | Status: DC

## 2012-05-03 MED ORDER — SODIUM CHLORIDE 0.9 % IV BOLUS (SEPSIS)
500.0000 mL | Freq: Once | INTRAVENOUS | Status: AC
  Administered 2012-05-03: 500 mL via INTRAVENOUS

## 2012-05-03 MED ORDER — PREGABALIN 25 MG PO CAPS
25.0000 mg | ORAL_CAPSULE | Freq: Three times a day (TID) | ORAL | Status: DC
  Administered 2012-05-03 – 2012-05-04 (×3): 25 mg via ORAL
  Filled 2012-05-03 (×3): qty 1

## 2012-05-03 MED ORDER — LORAZEPAM 0.5 MG PO TABS
0.5000 mg | ORAL_TABLET | Freq: Three times a day (TID) | ORAL | Status: DC | PRN
  Administered 2012-05-03 – 2012-05-06 (×4): 0.5 mg via ORAL
  Filled 2012-05-03 (×4): qty 1

## 2012-05-03 MED ORDER — SODIUM CHLORIDE 0.9 % IV SOLN
500.0000 mg | Freq: Two times a day (BID) | INTRAVENOUS | Status: DC
  Administered 2012-05-03 – 2012-05-10 (×13): 500 mg via INTRAVENOUS
  Filled 2012-05-03 (×19): qty 0.5

## 2012-05-03 MED ORDER — OXYMORPHONE HCL ER 40 MG PO TB12: 40.0000 mg | ORAL_TABLET | Freq: Three times a day (TID) | ORAL | Status: DC

## 2012-05-03 NOTE — Consult Note (Addendum)
Wound care follow-up:  MRI indicates osteomyelitis.  This is beyond [facility] scope of practice. According to [facility] literature, Vac therapy is contra-indicated with active,  untreated osteomyelitis. Attempted to discuss this aspect of vac therapy yesterday with pt prior to MRI results available but he insisted that vac dressing be applied before he consented to transport to MRI and he was unwilling to have any other dressing. Discussed plan of care with primary team and also infectious disease.  They recommend further plan of care be determined by ortho service. Will not plan to follow further unless re-consulted.  [address], RN, MSN, [employer]  [phone]

## 2012-05-03 NOTE — Progress Notes (Signed)
Patient's BP 91/54 HR 83.Patient requesting for iv pain medicine.Explained to patient that I have to call MD first because BP is low and the pain medicine will lower more the BP.Patient upset and wants to go home.MD notified,order rec'd to give 500cc NS bolus.Explained to pt about giving bolus IVF to raise BP.Wife at bedside.Will continue to monitor. Keairra Bardon Joselita,RN

## 2012-05-03 NOTE — Progress Notes (Signed)
Dr Ashley Royalty discussed plan of care with pt.  Pt consents to remaining in 3311 if E-link camera and screen are covered.  Both covered at this time.  E-link staff informed that pt doesn't want them to camera in.  Pt states "I have no privacy here."  Pt also refuses to have peripheral IV placed in order to discontinue TLC as ordered.  Risks of infection explained.  Pt more cooperative and less anxious at this time.

## 2012-05-03 NOTE — Progress Notes (Signed)
Called by bedside RN at the request of Dr. Ashley Royalty to evaluate patient.  Upon arrival to patient's room RN at bedside.  SBP 90's-100's, Temp 103, sats 88% on room air--placed on nasal cannula.  patient is confused which seems to be new according to RN, chart reviewed.  ?? Sepsis, PCXR ordered.  Spoke with Dr. Ashley Royalty-- recommended pt to be transferred to SDU.  Rn to call if assistance needed, will follow

## 2012-05-03 NOTE — Progress Notes (Signed)
Pt temp increased to 103.1, HR 125, BP 102/61, RR 18, SpO2 87 on RA. 2LNC applied. Patient increasingly confused, disoriented x2, and MD notified. MD requests further assessment by Rapid Response. Rapid RN suggests move to step-down. Stat CXR obtained at bedside and patient placed on monitor. Will continue to monitor.

## 2012-05-03 NOTE — Progress Notes (Signed)
Patient refused to take po medications including lyrica and ms contin,pt threw medicines on the floor.Wife at bedside. Belladonna Lubinski Joselita,RN

## 2012-05-03 NOTE — Progress Notes (Signed)
Report called and pt transferred to 3311.

## 2012-05-03 NOTE — Progress Notes (Signed)
Daily Renal Progress Note  Subjective:   Pt states he "does not remember last night." States he does not feel well this morning. He is having pain. Reportedly threw meds on the floor, is shaky this AM.  Good UOP   Objective:   BP 137/97  Pulse 113  Temp 100 F (37.8 C) (Oral)  Resp 20  Ht 5\' 3"  (1.6 m)  Wt 152 lb 1.6 oz (68.992 kg)  BMI 26.94 kg/m2  SpO2 97%  Intake/Output Summary (Last 24 hours) at 05/03/12 0751 Last data filed at 05/03/12 1610  Gross per 24 hour  Intake 1282.25 ml  Output   3575 ml  Net -2292.75 ml   Weight change:   Physical Exam: Gen: In bed. Awake and talkative. Family member at bedside Neck: RIJ in place, dressing C/D/I CVS: Tachycardic, regular rhythm  Resp: Normal work of breathing, no wheezes Abd: Soft,TTP upper quadrants Ext: ?trace edema. Neuro: Does not move lower extremities.  Imaging: Dg Chest Port 1 View  05/01/2012  *RADIOLOGY REPORT*  Clinical Data: Central line placement.  PORTABLE CHEST - 1 VIEW  Comparison: 04/27/2012.  Findings: Right central line tip distal superior vena cava level. No gross pneumothorax.  Central pulmonary vascular prominence.  Tortuous aorta.  Heart size within normal limits.  IMPRESSION: Right central line tip distal superior vena cava level.   Original Report Authenticated By: Fuller Canada, M.D.    Labs: BMET  Lab 05/03/12 0559 05/02/12 0500 05/01/12 1745  NA 142 136 138  K 4.3 3.8 3.7  CL 107 102 103  CO2 24 22 23   GLUCOSE 95 114* 139*  BUN 37* 39* 39*  CREATININE 2.69* 2.65* 2.55*  ALB -- -- --  CALCIUM 8.9 8.8 9.1  PHOS 5.9* 4.9* 4.5   CBC  Lab 05/03/12 0557 05/02/12 0500 05/01/12 1745  WBC 11.3* 11.2* 12.8*  NEUTROABS -- 7.5 9.4*  HGB 7.7* 7.0* 6.7*  HCT 22.9* 20.1* 19.1*  MCV 80.4 77.9* 76.1*  PLT 45* 17* 21*    Medications:       . aspirin EC  81 mg Oral Daily  . DAPTOmycin (CUBICIN)  IV  420 mg Intravenous Q48H  . docusate sodium  100 mg Oral BID  . feeding supplement  237 mL  Oral BID BM  . fluconazole  200 mg Oral Daily  . LORazepam  1 mg Intravenous Once  . morphine  100 mg Oral Q12H  . multivitamin with minerals  1 tablet Oral Daily  . neomycin-bacitracin-polymyxin   Topical Daily  . nutrition supplement  1 packet Oral BID BM  . pantoprazole  80 mg Oral Q1200  . piperacillin-tazobactam (ZOSYN)  IV  3.375 g Intravenous Q8H  . pregabalin  100 mg Oral TID  . senna  1 tablet Oral BID  . sodium chloride  500 mL Intravenous Once    Assessment/ Plan:   Pt is a 51 y.o. yo male with a PMHx of Depression, anxiety, HTN, GERD, PNA, Osteo, Prostate cancer, neurogenic bladder and UTI, who was transferred to [facility] on 05/01/2012 for evaluation of worsening renal function after being treated for fever/sepsis, osteomyelitis, sacral wound, paraplegia, and chronic pain at [facility].   ARF- Patient has normal renal function at baseline with a Creatinine of 0.9 in early August 2013. Differential diagnoses for ARF include Vancomycin toxicity after extended use, volume depletion, UTI and sepsis.  Electrolytes were relatively unremarkable at time of transfer as well. Currently no signs of uremia.  - Labs  at transfer to [facility] showed Cr of 2.55 (which has been a gradual increase over the last week or so.) Today, Cr is 2.69. Patient continues to have good UOP with 3575cc/24 hours  - Strict I/O; PO ad lib  - Foley has been in place since Sept 10. (Does have chronic foley from neurogenic bladder) - Repeat labs in the AM - Gven 7-10 WBC and yeast, we are treating with 3 days of diflucan and recheck U/A on Sunday - Will continue to trend Cr, but not acute indication for intervention at this time.    Had over 3 liters of UOP yest- maybe a post injury diureses.  Feel like his chronic pain meds are building up in his system and giving him these behavioral issues.  Have decreased lyrica to 25 TID and decreased MS contin to 60 BID  Anemia- Patient reports 4 units PRBC transfused at  OSH (2 units reported) and remains anemic. HgB 6.7 yesterday and transfused 2 units on 05/01/12. HgB this morning 7.7 post transfusion. His platelet count also low at admission with count of 17. Received platelets yesterday as well and now up to 45. - Awaiting labs from today - Iron panel shows low iron stores. Will consider giving iron as well - Monitor for signs of bleeding not sure etiology of thrombocytopenia and severe anemia- apparently HIT was negative at [place].  Per primary team transfuse as needed.  Infection- Osteomyelitis, bacteremia and sacral ulcer. No longer on Vanc, transitioned to Daptomycin prior to transfer.  - Per primary team  - Wound care consult for sacral ulcer (has wound vac)  - Continue Dapto and Zosyn, per ID - Given worsening renal function, we are treating yeast in urine with Diflucan x3 days - MRI of spine final read still pending  DVT PPX - SCD   Amber M. Hairford, M.D. 05/03/2012, 7:51 AM   Patient seen and examined, agree with above note with above modifications.  51 year old WM with chronic pain admit with ARF in the setting of supratherapeutic vanc/hypotension/UTI- stable kidney function and good UOP.  Continue to avoid further insults, give short treatment for funguria. Minimize meds that will decrease BP Annie Sable, MD 05/03/2012

## 2012-05-03 NOTE — Plan of Care (Signed)
Pt arrived from 6700 with RN, left on air overlay, began to place patient on tele and he demanded to go home, refused to allow any monitoring continues to demand that he return to 6700 or be taken home.  Dr Ashley Royalty paged, awaiting response - Melina Schools
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
## 2012-05-03 NOTE — Plan of Care (Signed)
Spoke with Dr Ashley Royalty and she stated that she would come to [facility] and speak with patient, I relayed that to patient and he continues to state he is going home or back to [facility]

## 2012-05-03 NOTE — Progress Notes (Signed)
Pt with worsening confusion, thinks ekg lead is blood transfusion, seeing things out the window, repetitive questioning. Bed alarm on, family with patient. Will continue to monitor.

## 2012-05-03 NOTE — Progress Notes (Addendum)
Patient ID: [name], male   DOB: [birth year], 51 y.o.   MRN: [id number] Receive a call from the bedside nurse stating that the patient had an acute change in medical condition. She reported his HR 123, O2 sats 88% on RA, T 103.1, BP 102/61, RR 23. I requested lab response to come and evaluate the patient. Per her evaluation she fell the patient should be transferred to the step down unit for further monitoring.  I arrived on the floor approximately 20 minutes after the call and per my examination: Gen: Patient appeared confused and mildly toxic.  Vital Signs:  HR 126, BP 111/61, RR 14, O2 sat 95% on 2 L/min. Lungs: Mild bibasilar crackles COR: Tachycardia. (Patient refusing 12-lead EKG) Abd: Soft nontender nondistended  A/P: Patient exhibiting signs of sepsis. Etiology is unclear. However the patient does have a IJ line in place which could be contributing. He does however have very poor peripheral IV access. I will asked Dr. Ninetta Lights to give an opinion in terms of the infectious contribution to his currently septic picture. We'll continue current antibiotics and supportive therapy. I will also obtain an x-ray to evaluate for the hypoxemia. The patient is being transferred to the step down unit for close monitoring. Total time in critical care management= 35 minutes  Shade Kaley A.
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
## 2012-05-03 NOTE — Progress Notes (Signed)
New orders received from Dr Ashley Royalty.  PICC to be placed, then TLC dc'ed.  Pt remains cooperative.

## 2012-05-03 NOTE — Progress Notes (Signed)
Patient with a temp of 102.9. MD paged for notification and 650 tylenol PO given. Will continue to monitor

## 2012-05-04 ENCOUNTER — Inpatient Hospital Stay (HOSPITAL_COMMUNITY): Payer: Medicaid Other

## 2012-05-04 DIAGNOSIS — M869 Osteomyelitis, unspecified: Secondary | ICD-10-CM

## 2012-05-04 DIAGNOSIS — L8990 Pressure ulcer of unspecified site, unspecified stage: Secondary | ICD-10-CM

## 2012-05-04 DIAGNOSIS — Z8739 Personal history of other diseases of the musculoskeletal system and connective tissue: Secondary | ICD-10-CM | POA: Diagnosis present

## 2012-05-04 LAB — RENAL FUNCTION PANEL
Albumin: 2.6 g/dL — ABNORMAL LOW (ref 3.5–5.2)
BUN: 40 mg/dL — ABNORMAL HIGH (ref 6–23)
CO2: 23 mEq/L (ref 19–32)
Calcium: 8.6 mg/dL (ref 8.4–10.5)
Chloride: 104 mEq/L (ref 96–112)
Creatinine, Ser: 2.69 mg/dL — ABNORMAL HIGH (ref 0.50–1.35)
GFR calc Af Amer: 30 mL/min — ABNORMAL LOW (ref >90)
GFR calc non Af Amer: 26 mL/min — ABNORMAL LOW (ref >90)
Glucose, Bld: 370 mg/dL — ABNORMAL HIGH (ref 70–99)
Phosphorus: 4.9 mg/dL — ABNORMAL HIGH (ref 2.3–4.6)
Potassium: 4.7 mEq/L (ref 3.5–5.1)
Sodium: 138 mEq/L (ref 135–145)

## 2012-05-04 LAB — HEMOGLOBIN A1C
Hgb A1c MFr Bld: 5.7 % — ABNORMAL HIGH (ref <5.7)
Mean Plasma Glucose: 117 mg/dL — ABNORMAL HIGH (ref <117)

## 2012-05-04 LAB — CBC WITH DIFFERENTIAL/PLATELET
Basophils Absolute: 0 10*3/uL (ref 0.0–0.1)
Basophils Relative: 0 % (ref 0–1)
Eosinophils Absolute: 0.3 10*3/uL (ref 0.0–0.7)
Eosinophils Relative: 3 % (ref 0–5)
HCT: 19.6 % — ABNORMAL LOW (ref 39.0–52.0)
Hemoglobin: 6.6 g/dL — CL (ref 13.0–17.0)
Lymphocytes Relative: 15 % (ref 12–46)
Lymphs Abs: 1.2 10*3/uL (ref 0.7–4.0)
MCH: 27.6 pg (ref 26.0–34.0)
MCHC: 33.7 g/dL (ref 30.0–36.0)
MCV: 82 fL (ref 78.0–100.0)
Monocytes Absolute: 0.7 10*3/uL (ref 0.1–1.0)
Monocytes Relative: 8 % (ref 3–12)
Neutro Abs: 6.1 10*3/uL (ref 1.7–7.7)
Neutrophils Relative %: 74 % (ref 43–77)
Platelets: 54 10*3/uL — ABNORMAL LOW (ref 150–400)
RBC: 2.39 MIL/uL — ABNORMAL LOW (ref 4.22–5.81)
RDW: 20.1 % — ABNORMAL HIGH (ref 11.5–15.5)
WBC: 8.2 10*3/uL (ref 4.0–10.5)

## 2012-05-04 LAB — PREPARE RBC (CROSSMATCH)

## 2012-05-04 LAB — CBC
HCT: 22.5 % — ABNORMAL LOW (ref 39.0–52.0)
Hemoglobin: 7.7 g/dL — ABNORMAL LOW (ref 13.0–17.0)
MCH: 28 pg (ref 26.0–34.0)
MCHC: 34.2 g/dL (ref 30.0–36.0)
MCV: 81.8 fL (ref 78.0–100.0)
Platelets: 71 10*3/uL — ABNORMAL LOW (ref 150–400)
RBC: 2.75 MIL/uL — ABNORMAL LOW (ref 4.22–5.81)
RDW: 19.3 % — ABNORMAL HIGH (ref 11.5–15.5)
WBC: 7.3 10*3/uL (ref 4.0–10.5)

## 2012-05-04 LAB — MRSA PCR SCREENING: MRSA by PCR: POSITIVE — AB

## 2012-05-04 LAB — GLUCOSE, CAPILLARY
Glucose-Capillary: 359 mg/dL — ABNORMAL HIGH (ref 70–99)
Glucose-Capillary: 404 mg/dL — ABNORMAL HIGH (ref 70–99)
Glucose-Capillary: 298 mg/dL — ABNORMAL HIGH (ref 70–99)
Glucose-Capillary: 159 mg/dL — ABNORMAL HIGH (ref 70–99)
Glucose-Capillary: 152 mg/dL — ABNORMAL HIGH (ref 70–99)

## 2012-05-04 MED ORDER — INSULIN ASPART 100 UNIT/ML ~~LOC~~ SOLN: 0.0000 [IU] | Freq: Every day | SUBCUTANEOUS | Status: DC

## 2012-05-04 MED ORDER — INSULIN ASPART 100 UNIT/ML ~~LOC~~ SOLN
0.0000 [IU] | Freq: Three times a day (TID) | SUBCUTANEOUS | Status: DC
  Administered 2012-05-04: 5 [IU] via SUBCUTANEOUS

## 2012-05-04 MED ORDER — INSULIN ASPART 100 UNIT/ML ~~LOC~~ SOLN
0.0000 [IU] | Freq: Three times a day (TID) | SUBCUTANEOUS | Status: DC
  Administered 2012-05-05 – 2012-05-06 (×3): 4 [IU] via SUBCUTANEOUS
  Administered 2012-05-06 – 2012-05-08 (×2): 3 [IU] via SUBCUTANEOUS
  Administered 2012-05-09: 4 [IU] via SUBCUTANEOUS
  Administered 2012-05-09: 3 [IU] via SUBCUTANEOUS

## 2012-05-04 MED ORDER — SODIUM CHLORIDE 0.9 % IJ SOLN: 10.0000 mL | INTRAMUSCULAR | Status: DC | PRN

## 2012-05-04 MED ORDER — ONDANSETRON HCL 4 MG/2ML IJ SOLN: 4.0000 mg | INTRAMUSCULAR | Status: DC | PRN

## 2012-05-04 MED ORDER — INSULIN ASPART 100 UNIT/ML ~~LOC~~ SOLN
14.0000 [IU] | Freq: Once | SUBCUTANEOUS | Status: AC
  Administered 2012-05-04: 14 [IU] via SUBCUTANEOUS

## 2012-05-04 MED ORDER — CYANOCOBALAMIN 1000 MCG/ML IJ SOLN
1000.0000 ug | Freq: Once | INTRAMUSCULAR | Status: AC
  Administered 2012-05-04: 1000 ug via SUBCUTANEOUS
  Filled 2012-05-04: qty 1

## 2012-05-04 MED ORDER — SODIUM CHLORIDE 0.9 % IJ SOLN
10.0000 mL | Freq: Two times a day (BID) | INTRAMUSCULAR | Status: DC
  Administered 2012-05-04 – 2012-05-10 (×7): 10 mL
  Filled 2012-05-04 (×3): qty 20

## 2012-05-04 MED ORDER — MUPIROCIN 2 % EX OINT
1.0000 "application " | TOPICAL_OINTMENT | Freq: Two times a day (BID) | CUTANEOUS | Status: AC
  Administered 2012-05-04 – 2012-05-08 (×7): 1 via NASAL
  Filled 2012-05-04: qty 22

## 2012-05-04 MED ORDER — ALBUTEROL SULFATE (5 MG/ML) 0.5% IN NEBU
2.5000 mg | INHALATION_SOLUTION | RESPIRATORY_TRACT | Status: DC | PRN
  Administered 2012-05-06 (×2): 2.5 mg via RESPIRATORY_TRACT
  Filled 2012-05-04 (×3): qty 0.5

## 2012-05-04 MED ORDER — OXYMORPHONE HCL ER 10 MG PO T12A
40.0000 mg | EXTENDED_RELEASE_TABLET | Freq: Three times a day (TID) | ORAL | Status: DC
  Administered 2012-05-04 – 2012-05-07 (×9): 40 mg via ORAL
  Filled 2012-05-04: qty 4
  Filled 2012-05-04: qty 1
  Filled 2012-05-04: qty 4
  Filled 2012-05-04 (×2): qty 3
  Filled 2012-05-04 (×7): qty 4
  Filled 2012-05-04: qty 1

## 2012-05-04 MED ORDER — CHLORHEXIDINE GLUCONATE CLOTH 2 % EX PADS
6.0000 | MEDICATED_PAD | Freq: Every day | CUTANEOUS | Status: AC
  Administered 2012-05-04 – 2012-05-08 (×5): 6 via TOPICAL

## 2012-05-04 MED ORDER — ONDANSETRON HCL 4 MG PO TABS
4.0000 mg | ORAL_TABLET | ORAL | Status: DC | PRN
  Administered 2012-05-05: 4 mg via ORAL
  Filled 2012-05-04: qty 1

## 2012-05-04 NOTE — Progress Notes (Signed)
Dr. Kathrene Bongo notified that pt states he will leave AMA if a doctor doesn't order his Opana. New order received for Opana. Renette Butters, Viona Gilmore
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
## 2012-05-04 NOTE — Progress Notes (Signed)
Do not change wound vac today per Dr. Sharon Seller. Renette Butters, Viona Gilmore

## 2012-05-04 NOTE — Progress Notes (Signed)
Daily Renal Progress Note  Subjective:   Pt became more acutely ill yesterday afternoon with a sepsis-like clinical picture, therefore he was transferred to stepdown unit. Antibiotics were changed from Dapto/Zosyn to Dapto/Merrem. Also, his IJ will be removed and a PICC placed. Otherwise, patient states he feels ok. He continues to have excellent UOP with 3.2 liters in last 24 hours. Very sleepy this AM   Objective:   BP 105/71  Pulse 91  Temp 97.6 F (36.4 C) (Oral)  Resp 11  Ht 5\' 3"  (1.6 m)  Wt 152 lb 1.6 oz (68.992 kg)  BMI 26.94 kg/m2  SpO2 97%  Intake/Output Summary (Last 24 hours) at 05/04/12 0738 Last data filed at 05/04/12 0600  Gross per 24 hour  Intake 2473.4 ml  Output   3275 ml  Net -801.6 ml   Weight change:   Physical Exam: Gen: In bed. Awake and talkative. Currently in sterile procedure for PICC placement, so remainder of exam deferred.  Imaging: Mr Thoracic Spine Wo Contrast  05/03/2012  *RADIOLOGY REPORT*  Clinical Data:  52 year old male with paraplegia, fevers.  Renal insufficiency precludes IV contrast.  Comparison: CT abdomen pelvis 03/31/2012.  MRI THORACIC SPINE WITHOUT CONTRAST  Technique: Multiplanar and multiecho pulse sequences of the thoracic spine were obtained without intravenous contrast.  Findings:  Limited sagittal imaging of the cervical spine suggests a bone marrow heterogeneity at the C3-C4 endplate.  This is of unclear significance and there is no definite prevertebral or paraspinal inflammation at this level.  Because of motion artifact, it is difficult to confirm the cervical spine numbering utilized on these images.  The abnormal lower thoracic levels seen on the recent CT seems to be the T9-T10 level.  This does appear to be the only level of possible remote spinal injury, with bulky heterotopic ossification contributing to multifactorial spinal stenosis.  There is spinal cord mass effect and increased T2 and STIR signal abnormality here  compatible with chronic cord injury and myelomalacia in the setting of chronic paraplegia. However, there is abnormal marrow edema at this level and increased T2 and STIR signal within the former disc space.  Mild associated paraspinal inflammation is noted bilaterally.  No definite epidural collection or dural thickening.  Other thoracic levels appear within normal limits.  The thoracic spinal cord elsewhere appears grossly normal.  The conus medullaris is better seen on the lumbar study below.   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  Stable other visualized thoracic and upper abdominal viscera.  IMPRESSION: 1.  The abnormal lower thoracic level as seen on the recent CT is designated T9-T10 and shows evidence of acute osteomyelitis as well as remote injury. 2.  No associated epidural abscess. 3.  Other thoracic levels appear within normal limits.  See lumbar findings below.  MRI LUMBAR SPINE WITHOUT CONTRAST  Technique: Multiplanar and multiecho pulse sequences of the lumbar spine were obtained without intravenous contrast.  Findings:  The patient refused axial lumbar imaging.  The numbering system in the cervical and thoracic spine results in transitional lumbosacral anatomy with a sacralized L5 level. Correlation with radiographs is recommended prior to any operative intervention.  No STIR imaging of the lumbar spine was obtained.  Lumbar marrow signal appears to be normal except for degenerative endplate changes at L1 and L4-L5.  The conus medullaris appears normal at L1.  Cauda equina nerve roots appear grossly normal.  There is multifactorial mild spinal and mild to moderate foraminal  stenosis at L3-L4.  No lumbar epidural collection is evident.  Visualized sacrum to the S3 level appears within normal limits.  Lumbar paraspinal soft tissues are grossly normal.  IMPRESSION: 1. The examination was discontinued prior to completion due to patient claustrophobia.  2.  Degenerative changes in the lumbar spine, but no definite lumbar spinal infection. 3.  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   Original Report Authenticated By: Harley Hallmark, M.D.    Mr Lumbar Spine Wo Contrast  05/03/2012  *RADIOLOGY REPORT*  Clinical Data:  51 year old male with paraplegia, fevers.  Renal insufficiency precludes IV contrast.  Comparison: CT abdomen pelvis 03/31/2012.  MRI THORACIC SPINE WITHOUT CONTRAST  Technique: Multiplanar and multiecho pulse sequences of the thoracic spine were obtained without intravenous contrast.  Findings:  Limited sagittal imaging of the cervical spine suggests a bone marrow heterogeneity at the C3-C4 endplate.  This is of unclear significance and there is no definite prevertebral or paraspinal inflammation at this level.  Because of motion artifact, it is difficult to confirm the cervical spine numbering utilized on these images.  The abnormal lower thoracic levels seen on the recent CT seems to be the T9-T10 level.  This does appear to be the only level of possible remote spinal injury, with bulky heterotopic ossification contributing to multifactorial spinal stenosis.  There is spinal cord mass effect and increased T2 and STIR signal abnormality here compatible with chronic cord injury and myelomalacia in the setting of chronic paraplegia. However, there is abnormal marrow edema at this level and increased T2 and STIR signal within the former disc space.  Mild associated paraspinal inflammation is noted bilaterally.  No definite epidural collection or dural thickening.  Other thoracic levels appear within normal limits.  The thoracic spinal cord elsewhere appears grossly normal.  The conus medullaris is better seen on the lumbar study below.   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  Stable other visualized thoracic and upper abdominal viscera.  IMPRESSION: 1.  The abnormal lower  thoracic level as seen on the recent CT is designated T9-T10 and shows evidence of acute osteomyelitis as well as remote injury. 2.  No associated epidural abscess. 3.  Other thoracic levels appear within normal limits.  See lumbar findings below.  MRI LUMBAR SPINE WITHOUT CONTRAST  Technique: Multiplanar and multiecho pulse sequences of the lumbar spine were obtained without intravenous contrast.  Findings:  The patient refused axial lumbar imaging.  The numbering system in the cervical and thoracic spine results in transitional lumbosacral anatomy with a sacralized L5 level. Correlation with radiographs is recommended prior to any operative intervention.  No STIR imaging of the lumbar spine was obtained.  Lumbar marrow signal appears to be normal except for degenerative endplate changes at L1 and L4-L5.  The conus medullaris appears normal at L1.  Cauda equina nerve roots appear grossly normal.  There is multifactorial mild spinal and mild to moderate foraminal stenosis at L3-L4.  No lumbar epidural collection is evident.  Visualized sacrum to the S3 level appears within normal limits.  Lumbar paraspinal soft tissues are grossly normal.  IMPRESSION: 1. The examination was discontinued prior to completion due to patient claustrophobia. 2.  Degenerative changes in the lumbar spine, but no definite lumbar spinal infection. 3.  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   Original Report Authenticated By: Harley Hallmark, M.D.    Dg Chest Port 1 View  05/03/2012  *RADIOLOGY  REPORT*  Clinical Data: Hypoxemia.  PORTABLE CHEST - 1 VIEW  Comparison: Multiple priors, most recently 90 [id number].  Findings: There is a right-sided internal jugular central venous catheter with tip terminating in the distal superior vena cava. Lung volumes are low. There is cephalization of the pulmonary vasculature and slight indistinctness of the interstitial markings suggestive of mild pulmonary edema.  No more  confluent consolidative airspace disease.  No definite pleural effusions. Heart size appears borderline to mildly enlarged.  Mediastinal contours are slightly distorted by patient rotation to the right and lordotic positioning.  IMPRESSION: 1.  Borderline to mild cardiomegaly with interval development of what appears to represent mild interstitial pulmonary edema.   Original Report Authenticated By: Florencia Reasons, M.D.    Labs: BMET  Lab 05/04/12 0418 05/03/12 0559 05/02/12 0500 05/01/12 1745  NA 138 142 136 138  K 4.7 4.3 3.8 3.7  CL 104 107 102 103  CO2 23 24 22 23   GLUCOSE 370* 95 114* 139*  BUN 40* 37* 39* 39*  CREATININE 2.69* 2.69* 2.65* 2.55*  ALB -- -- -- --  CALCIUM 8.6 8.9 8.8 9.1  PHOS 4.9* 5.9* 4.9* 4.5   CBC  Lab 05/04/12 0418 05/03/12 0557 05/02/12 0500 05/01/12 1745  WBC 8.2 11.3* 11.2* 12.8*  NEUTROABS 6.1 -- 7.5 9.4*  HGB 6.6* 7.7* 7.0* 6.7*  HCT 19.6* 22.9* 20.1* 19.1*  MCV 82.0 80.4 77.9* 76.1*  PLT 54* 45* 17* 21*    Medications:       . aspirin EC  81 mg Oral Daily  . DAPTOmycin (CUBICIN)  IV  420 mg Intravenous Q48H  . docusate sodium  100 mg Oral BID  . feeding supplement  237 mL Oral BID BM  . fluconazole  200 mg Oral Daily  . insulin aspart  0-5 Units Subcutaneous QHS  . insulin aspart  0-9 Units Subcutaneous TID WC  . meropenem (MERREM) IV  500 mg Intravenous Q12H  . multivitamin with minerals  1 tablet Oral Daily  . neomycin-bacitracin-polymyxin   Topical Daily  . nutrition supplement  1 packet Oral BID BM  . oxymorphone  40 mg Oral Q8H  . pantoprazole  80 mg Oral Q1200  . pregabalin  25 mg Oral TID  . senna  1 tablet Oral BID  . DISCONTD: iron dextran (INFED/DEXFERRUM) infusion  1,000 mg Intravenous Once  . DISCONTD: iron dextran (INFED/DEXFERRUM) infusion  25 mg Intravenous Once  . DISCONTD: morphine  100 mg Oral Q12H  . DISCONTD: morphine  60 mg Oral Q12H  . DISCONTD: oxymorphone  40 mg Oral TID  . DISCONTD:  piperacillin-tazobactam (ZOSYN)  IV  3.375 g Intravenous Q8H  . DISCONTD: pregabalin  100 mg Oral TID  . DISCONTD: pregabalin  100 mg Oral TID    Assessment/ Plan:   Pt is a 51 y.o. yo male with a PMHx of Depression, anxiety, HTN, GERD, PNA, Osteo, Prostate cancer, neurogenic bladder and UTI, who was transferred to [facility] on 05/01/2012 for evaluation of worsening renal function after being treated for fever/sepsis, osteomyelitis, sacral wound, paraplegia, and chronic pain at [facility].   ARF- Patient has normal renal function at baseline with a Creatinine of 0.9 in early August 2013. Differential diagnoses for ARF include Vancomycin toxicity after extended use, volume depletion, UTI and sepsis.  Electrolytes were relatively unremarkable at time of transfer as well. Currently no signs of uremia.  - Labs at transfer to [facility] showed Cr of 2.55 (which has been  a gradual increase over the last week or so.) Today, Cr is 2.69 which is same as yesterday. Patient continues to have good UOP with 3.2L/24 hours No indications for HD - With elevated Cr, it is possible that pain medications are building up in his system and contributing to his mental status changes. Lyrica and MS Contin decreased yesterday. I am just going to stop them because he cannot stay awake today - Strict I/O; PO ad lib  - Current foley has been in place since Sept 10. (Does have chronic foley from neurogenic bladder) - Repeat labs in the AM - Gven 7-10 WBC and yeast, we are treating with 3 days of diflucan and recheck U/A on Sunday - Will continue to trend Cr, but not acute indication for intervention at this time as it seems patient's renal function is not getting acutely worse. Hopefully he will begin improving soon as his volume status is better and the Vanc is clearing.  Anemia- Patient reports 4 units PRBC transfused at OSH (2 units reported) and remains anemic. Transfused 2 units on 05/01/12. HgB this morning 6.6. His  platelet count also low at admission with count of 17. Received platelets yesterday as well and now up to 54. - Iron panel shows low iron stores. Given IV iron yesterday - Monitor for signs of bleeding not sure etiology of thrombocytopenia and severe anemia- apparently HIT was negative at [place].   - Per primary team, transfuse as needed (Currently 1 unit being transfused)  Infection- Osteomyelitis, bacteremia and sacral ulcer. No longer on Vanc, transitioned to Daptomycin prior to transfer.  - Per primary team - not sure why had acute fever, agree with d/c central line and cont abx - Orthomyelitis consult for sacral ulcer and osteomyelitis - Continue Dapto and Merrem, per ID - We are treating yeast in urine with Diflucan x3 days (Day #3 today)  DVT PPX - SCD   Amber M. Hairford, M.D. 05/04/2012, 7:38 AM   Patient seen and examined, agree with above note with above modifications. 51 year old WM with AKI in the setting of suprather vanc/hypotensio/uti.  Nonoliguric and no indications for HD.  Many underlying issues mainly being handled by primary team.  Will stop scheduled lyrica and long acting narcotic given MS today.   Annie Sable, MD 05/04/2012

## 2012-05-05 LAB — URINE MICROSCOPIC-ADD ON

## 2012-05-05 LAB — TYPE AND SCREEN
ABO/RH(D): B POS
Antibody Screen: NEGATIVE
Unit division: 0
Unit division: 0
Unit division: 0

## 2012-05-05 LAB — CBC
HCT: 24 % — ABNORMAL LOW (ref 39.0–52.0)
Hemoglobin: 8.2 g/dL — ABNORMAL LOW (ref 13.0–17.0)
MCH: 28.2 pg (ref 26.0–34.0)
MCHC: 34.2 g/dL (ref 30.0–36.0)
MCV: 82.5 fL (ref 78.0–100.0)
Platelets: 87 10*3/uL — ABNORMAL LOW (ref 150–400)
RBC: 2.91 MIL/uL — ABNORMAL LOW (ref 4.22–5.81)
RDW: 19.2 % — ABNORMAL HIGH (ref 11.5–15.5)
WBC: 9.9 10*3/uL (ref 4.0–10.5)

## 2012-05-05 LAB — RENAL FUNCTION PANEL
Albumin: 2.8 g/dL — ABNORMAL LOW (ref 3.5–5.2)
BUN: 36 mg/dL — ABNORMAL HIGH (ref 6–23)
CO2: 24 mEq/L (ref 19–32)
Calcium: 9.5 mg/dL (ref 8.4–10.5)
Chloride: 105 mEq/L (ref 96–112)
Creatinine, Ser: 2.53 mg/dL — ABNORMAL HIGH (ref 0.50–1.35)
GFR calc Af Amer: 33 mL/min — ABNORMAL LOW (ref >90)
GFR calc non Af Amer: 28 mL/min — ABNORMAL LOW (ref >90)
Glucose, Bld: 174 mg/dL — ABNORMAL HIGH (ref 70–99)
Phosphorus: 5.2 mg/dL — ABNORMAL HIGH (ref 2.3–4.6)
Potassium: 4.9 mEq/L (ref 3.5–5.1)
Sodium: 140 mEq/L (ref 135–145)

## 2012-05-05 LAB — GLUCOSE, CAPILLARY
Glucose-Capillary: 180 mg/dL — ABNORMAL HIGH (ref 70–99)
Glucose-Capillary: 111 mg/dL — ABNORMAL HIGH (ref 70–99)
Glucose-Capillary: 153 mg/dL — ABNORMAL HIGH (ref 70–99)
Glucose-Capillary: 69 mg/dL — ABNORMAL LOW (ref 70–99)
Glucose-Capillary: 94 mg/dL (ref 70–99)

## 2012-05-05 LAB — URINALYSIS, ROUTINE W REFLEX MICROSCOPIC
Bilirubin Urine: NEGATIVE
Glucose, UA: 100 mg/dL — AB
Ketones, ur: NEGATIVE mg/dL
Leukocytes, UA: NEGATIVE
Nitrite: NEGATIVE
Protein, ur: NEGATIVE mg/dL
Specific Gravity, Urine: 1.008 (ref 1.005–1.030)
Urobilinogen, UA: 0.2 mg/dL (ref 0.0–1.0)
pH: 6.5 (ref 5.0–8.0)

## 2012-05-05 MED ORDER — PREGABALIN 25 MG PO CAPS
25.0000 mg | ORAL_CAPSULE | Freq: Two times a day (BID) | ORAL | Status: DC
  Administered 2012-05-05 – 2012-05-07 (×3): 25 mg via ORAL
  Filled 2012-05-05 (×5): qty 1

## 2012-05-05 MED ORDER — POLYETHYLENE GLYCOL 3350 17 G PO PACK
17.0000 g | PACK | Freq: Every day | ORAL | Status: DC | PRN
  Filled 2012-05-05: qty 1

## 2012-05-05 MED ORDER — BISACODYL 10 MG RE SUPP
10.0000 mg | Freq: Every day | RECTAL | Status: DC | PRN
  Administered 2012-05-05: 10 mg via RECTAL
  Filled 2012-05-05: qty 1

## 2012-05-05 MED ORDER — DARBEPOETIN ALFA-POLYSORBATE 100 MCG/0.5ML IJ SOLN
100.0000 ug | INTRAMUSCULAR | Status: DC
  Administered 2012-05-05: 100 ug via SUBCUTANEOUS
  Filled 2012-05-05: qty 0.5
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
## 2012-05-05 NOTE — Progress Notes (Signed)
Daily Renal Progress Note  Subjective:   Patient complaining of decreased appetite and abdominal quivering, which has gotten worse. Patient now on Opana for pain med with PRN dilaudid since MS contin and Lyrica stopped. Patient more alert today   Objective:   BP 119/78  Pulse 101  Temp 98.1 F (36.7 C) (Oral)  Resp 10  Ht 5\' 3"  (1.6 m)  Wt 152 lb 1.6 oz (68.992 kg)  BMI 26.94 kg/m2  SpO2 93%  Intake/Output Summary (Last 24 hours) at 05/05/12 0820 Last data filed at 05/05/12 0600  Gross per 24 hour  Intake   1435 ml  Output   3525 ml  Net  -2090 ml   Weight change:   Physical Exam: Gen: In bed. Awake and talkative. Currently in sterile procedure for PICC placement, so remainder of exam deferred.  Imaging: Ct Abdomen Pelvis Wo Contrast  05/04/2012  *RADIOLOGY REPORT*  Clinical Data: Anemia, evaluate for retroperitoneal hemorrhage  CT ABDOMEN AND PELVIS WITHOUT CONTRAST  Technique:  Multidetector CT imaging of the abdomen and pelvis was performed following the standard protocol without intravenous contrast.  Comparison: [place] CT abdomen/pelvis dated 03/31/2012. Correlation with MRI thoracic spine dated 05/02/2012  Findings: Trace bilateral pleural effusions with associated dependent atelectasis.  Blood pole is hypodense relative to myocardium, corresponding to known anemia.  Unenhanced liver, pancreas, and adrenal glands are within normal limits.  Spleen is mildly enlarged, measuring 13.9 cm in craniocaudal dimension.  Gallbladder is notable for apparent wall thickening, although this is favored to be secondary to underdistension.  No intrahepatic or extrahepatic ductal dilatation.  5 mm nonobstructing left renal calculus (series 2/image 41).  Right kidney is unremarkable.  No hydronephrosis.  No evidence of bowel obstruction.  Normal appendix.  Atherosclerotic calcifications of the abdominal aorta and branch vessels.  IVC filter.  No abdominopelvic ascites.  No suspicious abdominopelvic  lymphadenopathy.  No evidence of retroperitoneal hemorrhage.  Bladder is mildly thick-walled but underdistended.  Indwelling Foley catheter.  Prostate is unremarkable.  Again seen is a sacral decubitus ulcer with a suspected cortical irregularity involving the distal sacrum/coccyx (see sagittal image 58), likely reflecting osteomyelitis.  Associated presacral soft tissue stranding (series 2/image 73), new/increased.  Again seen is near complete destruction of the T10 vertebral body with associated destructive changes at T9 and T11, compatible with acute osteomyelitis as noted on recent MRI.  IMPRESSION: No evidence of retroperitoneal hemorrhage.  Mild splenomegaly.  Near complete destruction of the T10 vertebral body with known osteomyelitis.  Sacral decubitus ulcer with suspected sacrococcygeal osteomyelitis.   Original Report Authenticated By: Charline Bills, M.D.    Dg Chest Port 1 View  05/03/2012  *RADIOLOGY REPORT*  Clinical Data: Hypoxemia.  PORTABLE CHEST - 1 VIEW  Comparison: Multiple priors, most recently 90 [id number].  Findings: There is a right-sided internal jugular central venous catheter with tip terminating in the distal superior vena cava. Lung volumes are low. There is cephalization of the pulmonary vasculature and slight indistinctness of the interstitial markings suggestive of mild pulmonary edema.  No more confluent consolidative airspace disease.  No definite pleural effusions. Heart size appears borderline to mildly enlarged.  Mediastinal contours are slightly distorted by patient rotation to the right and lordotic positioning.  IMPRESSION: 1.  Borderline to mild cardiomegaly with interval development of what appears to represent mild interstitial pulmonary edema.   Original Report Authenticated By: Florencia Reasons, M.D.    Labs: BMET  Lab 05/05/12 5409 05/04/12 8119 05/03/12 0559 05/02/12 0500 05/01/12  1745  NA 140 138 142 136 138  K 4.9 4.7 4.3 3.8 3.7  CL 105 104 107 102 103    CO2 24 23 24 22 23   GLUCOSE 174* 370* 95 114* 139*  BUN 36* 40* 37* 39* 39*  CREATININE 2.53* 2.69* 2.69* 2.65* 2.55*  ALB -- -- -- -- --  CALCIUM 9.5 8.6 8.9 8.8 9.1  PHOS 5.2* 4.9* 5.9* 4.9* 4.5   CBC  Lab 05/05/12 0450 05/04/12 1800 05/04/12 0418 05/03/12 0557 05/02/12 0500 05/01/12 1745  WBC 9.9 7.3 8.2 11.3* -- --  NEUTROABS -- -- 6.1 -- 7.5 9.4*  HGB 8.2* 7.7* 6.6* 7.7* -- --  HCT 24.0* 22.5* 19.6* 22.9* -- --  MCV 82.5 81.8 82.0 80.4 -- --  PLT 87* 71* 54* 45* -- --    Medications:       . aspirin EC  81 mg Oral Daily  . Chlorhexidine Gluconate Cloth  6 each Topical Q0600  . cyanocobalamin  1,000 mcg Subcutaneous Once  . DAPTOmycin (CUBICIN)  IV  420 mg Intravenous Q48H  . docusate sodium  100 mg Oral BID  . feeding supplement  237 mL Oral BID BM  . fluconazole  200 mg Oral Daily  . insulin aspart  0-20 Units Subcutaneous TID WC  . insulin aspart  0-5 Units Subcutaneous QHS  . insulin aspart  14 Units Subcutaneous Once  . meropenem (MERREM) IV  500 mg Intravenous Q12H  . multivitamin with minerals  1 tablet Oral Daily  . mupirocin ointment  1 application Nasal BID  . neomycin-bacitracin-polymyxin   Topical Daily  . nutrition supplement  1 packet Oral BID BM  . oxymorphone  40 mg Oral TID  . pantoprazole  80 mg Oral Q1200  . senna  1 tablet Oral BID  . sodium chloride  10-40 mL Intracatheter Q12H  . DISCONTD: insulin aspart  0-5 Units Subcutaneous QHS  . DISCONTD: insulin aspart  0-9 Units Subcutaneous TID WC  . DISCONTD: oxymorphone  40 mg Oral Q8H  . DISCONTD: pregabalin  25 mg Oral TID    Assessment/ Plan:   Pt is a 51 y.o. yo male with a PMHx of Depression, anxiety, HTN, GERD, PNA, Osteo, Prostate cancer, neurogenic bladder and UTI, who was transferred to [facility] on 05/01/2012 for evaluation of worsening renal function after being treated for fever/sepsis, osteomyelitis, sacral wound, paraplegia, and chronic pain at [facility].   ARF- Patient has  normal renal function at baseline with a Creatinine of 0.9 in early August 2013. Differential diagnoses for ARF include Vancomycin toxicity after extended use, volume depletion, UTI and sepsis.  Electrolytes were relatively unremarkable at time of transfer as well. Currently no signs of uremia.  - Labs at transfer to [facility] showed Cr of 2.55 (which has been a gradual increase over the last week or so.)  - Lyrica and MS contin d/c'd 9/21 secondary to sedation  - Strict I/O; PO ad lib  - Current foley has been in place since Sept 10. (Does have chronic foley from neurogenic bladder) - Repeat labs in the AM - Today, Cr is 2.53 which is same as yesterday. Patient continues to have good UOP with 3.5L/24 hours No indications for HD- I also agree with continuing IVF since having such good UOP - Phos elevated at 5.2, consider starting Phoslo  Anemia- Patient reports 4 units PRBC transfused at OSH (2 units reported) and remains anemic. Transfused 2 units on 05/01/12. HgB 9/21 6.6, again  s/p another 2 units of blood. His platelet count also low at admission with count of 17. Received platelets yesterday as well and now up to 54. - Iron panel shows low iron stores. Given IV iron yesterday - Monitor for signs of bleeding not sure etiology of thrombocytopenia and severe anemia- apparently HIT was negative at [place].   - Per primary team, transfuse as needed (Currently 1 unit being transfused) will add aranesp as well.  I have no idea why he has such severe anemia and thrombocytopenia, may want to get heme involved ?  Infection- Osteomyelitis, bacteremia and sacral ulcer. No longer on Vanc, transitioned to Daptomycin then transferred to stepdown on 9/20 for concern for sepsis - Ortho consult for sacral ulcer and osteomyelitis - Continue Dapto and Merrem, per ID - We are treating yeast in urine with Diflucan x3 days (Day #3 today); recheck Urinalysis today- looks better after treatment  DVT PPX - SCD    Si Raider. Clinton Sawyer 05/05/2012, 8:20 AM   Patient seen and examined, agree with above note with above modifications. 51 year old WM with AKI in the setting of suprather vanc/hypotensio/uti.  Nonoliguric and no indications for HD.  Many underlying issues mainly being handled by primary team.  Have stopped scheduled lyrica but more alert and notices this med being gone, will add back low dose Annie Sable, MD 05/05/2012

## 2012-05-05 NOTE — Progress Notes (Signed)
INFECTIOUS DISEASE PROGRESS NOTE  ID: Clarence Sims is a 51 y.o. male with   Active Problems:  Bacteremia associated with intravascular line  Paraplegia following spinal cord injury  Neurogenic bladder  Neurogenic bowel  Sacral decubitus ulcer, stage IV  Osteomyelitis, chronic, pelvic region  Acute renal failure  Anemia  Thrombocytopenia  Fever  GERD (gastroesophageal reflux disease)  Chronic indwelling foley catheter  Leukocytosis  Burn erythema  Sepsis  Sepsis associated hypotension  Osteomyelitis of spine  Subjective: Without complaints  Abtx:  Anti-infectives     Start     Dose/Rate Route Frequency Ordered Stop   05/03/12 1530   meropenem (MERREM) 500 mg in sodium chloride 0.9 % 50 mL IVPB        500 mg 100 mL/hr over 30 Minutes Intravenous Every 12 hours 05/03/12 1438     05/02/12 1100   piperacillin-tazobactam (ZOSYN) IVPB 3.375 g  Status:  Discontinued        3.375 g 12.5 mL/hr over 240 Minutes Intravenous Every 8 hours 05/02/12 1039 05/03/12 1438   05/02/12 1100   fluconazole (DIFLUCAN) tablet 200 mg        200 mg Oral Daily 05/02/12 1044 05/04/12 0946   05/01/12 1800   DAPTOmycin (CUBICIN) 420 mg in sodium chloride 0.9 % IVPB        420 mg 216.8 mL/hr over 30 Minutes Intravenous Every 48 hours 05/01/12 1624            Medications:  Scheduled:   . aspirin EC  81 mg Oral Daily  . Chlorhexidine Gluconate Cloth  6 each Topical Q0600  . cyanocobalamin  1,000 mcg Subcutaneous Once  . DAPTOmycin (CUBICIN)  IV  420 mg Intravenous Q48H  . docusate sodium  100 mg Oral BID  . feeding supplement  237 mL Oral BID BM  . fluconazole  200 mg Oral Daily  . insulin aspart  0-20 Units Subcutaneous TID WC  . insulin aspart  0-5 Units Subcutaneous QHS  . meropenem (MERREM) IV  500 mg Intravenous Q12H  . multivitamin with minerals  1 tablet Oral Daily  . mupirocin ointment  1 application Nasal BID  . neomycin-bacitracin-polymyxin   Topical Daily  . nutrition  supplement  1 packet Oral BID BM  . oxymorphone  40 mg Oral TID  . pantoprazole  80 mg Oral Q1200  . senna  1 tablet Oral BID  . sodium chloride  10-40 mL Intracatheter Q12H  . DISCONTD: insulin aspart  0-5 Units Subcutaneous QHS  . DISCONTD: insulin aspart  0-9 Units Subcutaneous TID WC  . DISCONTD: oxymorphone  40 mg Oral Q8H  . DISCONTD: pregabalin  25 mg Oral TID    Objective: Vital signs in last 24 hours: Temp:  [97.6 F (36.4 C)-98.1 F (36.7 C)] 97.8 F (36.6 C) (09/22 0822) Pulse Rate:  [84-101] 101  (09/22 0300) Resp:  [10-17] 10  (09/22 0300) BP: (90-121)/(62-80) 119/78 mmHg (09/22 0300) SpO2:  [93 %-100 %] 93 % (09/22 0300)   Resp: clear to auscultation bilaterally Cardio: regular rate and rhythm GI: normal findings: bowel sounds normal and soft, non-tender Extremities: RUE PIC with blood on gauze.   Lab Results  Basename 05/05/12 0450 05/04/12 1800 05/04/12 0418  WBC 9.9 7.3 --  HGB 8.2* 7.7* --  HCT 24.0* 22.5* --  NA 140 -- 138  K 4.9 -- 4.7  CL 105 -- 104  CO2 24 -- 23  BUN 36* -- 40*  CREATININE  2.53* -- 2.69*  GLU -- -- --   Liver Panel  Basename 05/05/12 0450 05/04/12 0418  PROT -- --  ALBUMIN 2.8* 2.6*  AST -- --  ALT -- --  ALKPHOS -- --  BILITOT -- --  BILIDIR -- --  IBILI -- --   Sedimentation Rate No results found for this basename: ESRSEDRATE in the last 72 hours C-Reactive Protein No results found for this basename: CRP:2 in the last 72 hours  Microbiology: Recent Results (from the past 240 hour(s))  URINE CULTURE     Status: Normal   Collection Time   05/01/12  4:31 PM      Component Value Range Status Comment   Specimen Description URINE, CATHETERIZED   Final    Special Requests ADDED 05/01/12 1843   Final    Culture  Setup Time 05/01/2012 19:09   Final    Colony Count NO GROWTH   Final    Culture NO GROWTH   Final    Report Status 05/02/2012 FINAL   Final   CULTURE, BLOOD (ROUTINE X 2)     Status: Normal (Preliminary  result)   Collection Time   05/01/12  6:45 PM      Component Value Range Status Comment   Specimen Description BLOOD LEFT HAND   Final    Special Requests BOTTLES DRAWN AEROBIC AND ANAEROBIC 10CC   Final    Culture  Setup Time 05/02/2012 01:02   Final    Culture     Final    Value:        BLOOD CULTURE RECEIVED NO GROWTH TO DATE CULTURE WILL BE HELD FOR 5 DAYS BEFORE ISSUING A FINAL NEGATIVE REPORT   Report Status PENDING   Incomplete   CULTURE, BLOOD (ROUTINE X 2)     Status: Normal (Preliminary result)   Collection Time   05/01/12  7:24 PM      Component Value Range Status Comment   Specimen Description BLOOD RIGHT HAND   Final    Special Requests BOTTLES DRAWN AEROBIC AND ANAEROBIC 10CC   Final    Culture  Setup Time 05/02/2012 01:02   Final    Culture     Final    Value:        BLOOD CULTURE RECEIVED NO GROWTH TO DATE CULTURE WILL BE HELD FOR 5 DAYS BEFORE ISSUING A FINAL NEGATIVE REPORT   Report Status PENDING   Incomplete   MRSA PCR SCREENING     Status: Abnormal   Collection Time   05/04/12  6:09 AM      Component Value Range Status Comment   MRSA by PCR POSITIVE (*) NEGATIVE Final     Studies/Results: Ct Abdomen Pelvis Wo Contrast  05/04/2012  *RADIOLOGY REPORT*  Clinical Data: Anemia, evaluate for retroperitoneal hemorrhage  CT ABDOMEN AND PELVIS WITHOUT CONTRAST  Technique:  Multidetector CT imaging of the abdomen and pelvis was performed following the standard protocol without intravenous contrast.  Comparison: [place] CT abdomen/pelvis dated 03/31/2012. Correlation with MRI thoracic spine dated 05/02/2012  Findings: Trace bilateral pleural effusions with associated dependent atelectasis.  Blood pole is hypodense relative to myocardium, corresponding to known anemia.  Unenhanced liver, pancreas, and adrenal glands are within normal limits.  Spleen is mildly enlarged, measuring 13.9 cm in craniocaudal dimension.  Gallbladder is notable for apparent wall thickening, although this  is favored to be secondary to underdistension.  No intrahepatic or extrahepatic ductal dilatation.  5 mm nonobstructing left renal calculus (series 2/image  41).  Right kidney is unremarkable.  No hydronephrosis.  No evidence of bowel obstruction.  Normal appendix.  Atherosclerotic calcifications of the abdominal aorta and branch vessels.  IVC filter.  No abdominopelvic ascites.  No suspicious abdominopelvic lymphadenopathy.  No evidence of retroperitoneal hemorrhage.  Bladder is mildly thick-walled but underdistended.  Indwelling Foley catheter.  Prostate is unremarkable.  Again seen is a sacral decubitus ulcer with a suspected cortical irregularity involving the distal sacrum/coccyx (see sagittal image 58), likely reflecting osteomyelitis.  Associated presacral soft tissue stranding (series 2/image 73), new/increased.  Again seen is near complete destruction of the T10 vertebral body with associated destructive changes at T9 and T11, compatible with acute osteomyelitis as noted on recent MRI.  IMPRESSION: No evidence of retroperitoneal hemorrhage.  Mild splenomegaly.  Near complete destruction of the T10 vertebral body with known osteomyelitis.  Sacral decubitus ulcer with suspected sacrococcygeal osteomyelitis.   Original Report Authenticated By: Charline Bills, M.D.    Dg Chest Port 1 View  05/03/2012  *RADIOLOGY REPORT*  Clinical Data: Hypoxemia.  PORTABLE CHEST - 1 VIEW  Comparison: Multiple priors, most recently 90 18-1013.  Findings: There is a right-sided internal jugular central venous catheter with tip terminating in the distal superior vena cava. Lung volumes are low. There is cephalization of the pulmonary vasculature and slight indistinctness of the interstitial markings suggestive of mild pulmonary edema.  No more confluent consolidative airspace disease.  No definite pleural effusions. Heart size appears borderline to mildly enlarged.  Mediastinal contours are slightly distorted by patient  rotation to the right and lordotic positioning.  IMPRESSION: 1.  Borderline to mild cardiomegaly with interval development of what appears to represent mild interstitial pulmonary edema.   Original Report Authenticated By: Florencia Reasons, M.D.      Assessment/Plan: Acute osteomyelitis T9-10 with destruction of T10 bone Sacral decubitus  Prev c-spine surgery,  Infection of previous surgical site  ARF  funguria  Total days of antibiotics 35  cubicin Day 4  merrem day 3  Would-  No change in cubicin for now, watch CK (nl yesterday)  Cont Merrem Defer to [facility] regarding need for VAC  CT abd does not show source of blood loss  HCT better with txf  Out of unit?   [name] Infectious Diseases [phone] 05/05/2012, 8:53 AM   LOS: 4 days

## 2012-05-06 ENCOUNTER — Inpatient Hospital Stay (HOSPITAL_COMMUNITY): Payer: Medicaid Other

## 2012-05-06 DIAGNOSIS — T300 Burn of unspecified body region, unspecified degree: Secondary | ICD-10-CM

## 2012-05-06 DIAGNOSIS — S21209A Unspecified open wound of unspecified back wall of thorax without penetration into thoracic cavity, initial encounter: Secondary | ICD-10-CM

## 2012-05-06 LAB — RENAL FUNCTION PANEL
Albumin: 3.1 g/dL — ABNORMAL LOW (ref 3.5–5.2)
BUN: 37 mg/dL — ABNORMAL HIGH (ref 6–23)
CO2: 26 mEq/L (ref 19–32)
Calcium: 9.9 mg/dL (ref 8.4–10.5)
Chloride: 105 mEq/L (ref 96–112)
Creatinine, Ser: 2.54 mg/dL — ABNORMAL HIGH (ref 0.50–1.35)
GFR calc Af Amer: 33 mL/min — ABNORMAL LOW (ref >90)
GFR calc non Af Amer: 28 mL/min — ABNORMAL LOW (ref >90)
Glucose, Bld: 124 mg/dL — ABNORMAL HIGH (ref 70–99)
Phosphorus: 4.3 mg/dL (ref 2.3–4.6)
Potassium: 4.9 mEq/L (ref 3.5–5.1)
Sodium: 141 mEq/L (ref 135–145)

## 2012-05-06 LAB — GLUCOSE, CAPILLARY
Glucose-Capillary: 128 mg/dL — ABNORMAL HIGH (ref 70–99)
Glucose-Capillary: 113 mg/dL — ABNORMAL HIGH (ref 70–99)
Glucose-Capillary: 158 mg/dL — ABNORMAL HIGH (ref 70–99)
Glucose-Capillary: 139 mg/dL — ABNORMAL HIGH (ref 70–99)

## 2012-05-06 LAB — CBC
HCT: 24.9 % — ABNORMAL LOW (ref 39.0–52.0)
Hemoglobin: 8.5 g/dL — ABNORMAL LOW (ref 13.0–17.0)
MCH: 27.6 pg (ref 26.0–34.0)
MCHC: 34.1 g/dL (ref 30.0–36.0)
MCV: 80.8 fL (ref 78.0–100.0)
Platelets: 100 10*3/uL — ABNORMAL LOW (ref 150–400)
RBC: 3.08 MIL/uL — ABNORMAL LOW (ref 4.22–5.81)
RDW: 18.5 % — ABNORMAL HIGH (ref 11.5–15.5)
WBC: 10.5 10*3/uL (ref 4.0–10.5)

## 2012-05-06 MED ORDER — LORAZEPAM 2 MG/ML IJ SOLN: 1.0000 mg | Freq: Four times a day (QID) | INTRAMUSCULAR | Status: DC | PRN

## 2012-05-06 MED ORDER — LORAZEPAM 2 MG/ML IJ SOLN
2.0000 mg | Freq: Four times a day (QID) | INTRAMUSCULAR | Status: DC | PRN
  Administered 2012-05-06 – 2012-05-07 (×4): 2 mg via INTRAVENOUS
  Filled 2012-05-06 (×3): qty 1

## 2012-05-06 MED ORDER — LORAZEPAM 2 MG/ML IJ SOLN
2.0000 mg | Freq: Four times a day (QID) | INTRAMUSCULAR | Status: DC | PRN
  Administered 2012-05-06: 2 mg via INTRAMUSCULAR
  Filled 2012-05-06 (×2): qty 1

## 2012-05-06 MED ORDER — LORAZEPAM 2 MG/ML IJ SOLN
INTRAMUSCULAR | Status: AC
  Filled 2012-05-06: qty 1

## 2012-05-06 NOTE — Progress Notes (Signed)
PT BECOMING COMBATIVE AND THROWING MEDICATION ACROSS ROOM. HE REFUSES TO TAKE ANYTHING INCLUDING IV PAIN MEDIATION.  HE IS REFUSING TO TURN OR ALLOW ANY TREATMENTS. PATIENT STATES HE CAN'T BREATHE, RESPIRATORY NOTIFIED. MD NOTIFIED. SATS 100% ON ROOM AIR. WILL CONTINUE TO MONITOR.

## 2012-05-06 NOTE — Progress Notes (Signed)
Patient continues to be very  very combative, verbally abusive and spitting on staff.  All supportive measures failed.Security called to patient's bedside. Restrain applied as ordered by MD with very little success.  Patient removed his RUE PICC Line and wound vac to sacral ulcer. Dr. Awilda Metro notified and Ativan 2mg  IM x 1 given to left deltoid. No new orders regarding wound vac placement at this time. Will continue to provide nursing care and emotional support.
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
## 2012-05-06 NOTE — Progress Notes (Signed)
Daily Renal Progress Note  Subjective:   Wife reports agitation and dyspnea overnight. Patient will open his eyes slightly but does not answer questions without falling asleep again. Wife has no questions or concerns at this time.   Objective:   BP 144/70  Pulse 74  Temp 98.5 F (36.9 C) (Oral)  Resp 16  Ht 5\' 3"  (1.6 m)  Wt 152 lb 1.6 oz (68.992 kg)  BMI 26.94 kg/m2  SpO2 97%  Intake/Output Summary (Last 24 hours) at 05/06/12 0827 Last data filed at 05/06/12 1610  Gross per 24 hour  Intake 1323.4 ml  Output   6200 ml  Net -4876.6 ml   Weight change:   Physical Exam: Gen: In bed, asleep. Does not respond to voice. Will open eyes for short periods of time HEENT: AT, [place]. MMM Neck: Right IJ pulled, incision C/D. No s/sx of infection Chest: CTAB while breathing normally. No respiratory distress Heart: Tachycardic. No murmur appreciated Abd: Occasional tremor. Soft Ext: PICC RUE. Neuro: Unable to assess due to mental status  Imaging: Ct Abdomen Pelvis Wo Contrast  05/04/2012  *RADIOLOGY REPORT*  Clinical Data: Anemia, evaluate for retroperitoneal hemorrhage  CT ABDOMEN AND PELVIS WITHOUT CONTRAST  Technique:  Multidetector CT imaging of the abdomen and pelvis was performed following the standard protocol without intravenous contrast.  Comparison: [place] CT abdomen/pelvis dated 03/31/2012. Correlation with MRI thoracic spine dated 05/02/2012  Findings: Trace bilateral pleural effusions with associated dependent atelectasis.  Blood pole is hypodense relative to myocardium, corresponding to known anemia.  Unenhanced liver, pancreas, and adrenal glands are within normal limits.  Spleen is mildly enlarged, measuring 13.9 cm in craniocaudal dimension.  Gallbladder is notable for apparent wall thickening, although this is favored to be secondary to underdistension.  No intrahepatic or extrahepatic ductal dilatation.  5 mm nonobstructing left renal calculus (series 2/image 41).  Right kidney  is unremarkable.  No hydronephrosis.  No evidence of bowel obstruction.  Normal appendix.  Atherosclerotic calcifications of the abdominal aorta and branch vessels.  IVC filter.  No abdominopelvic ascites.  No suspicious abdominopelvic lymphadenopathy.  No evidence of retroperitoneal hemorrhage.  Bladder is mildly thick-walled but underdistended.  Indwelling Foley catheter.  Prostate is unremarkable.  Again seen is a sacral decubitus ulcer with a suspected cortical irregularity involving the distal sacrum/coccyx (see sagittal image 58), likely reflecting osteomyelitis.  Associated presacral soft tissue stranding (series 2/image 73), new/increased.  Again seen is near complete destruction of the T10 vertebral body with associated destructive changes at T9 and T11, compatible with acute osteomyelitis as noted on recent MRI.  IMPRESSION: No evidence of retroperitoneal hemorrhage.  Mild splenomegaly.  Near complete destruction of the T10 vertebral body with known osteomyelitis.  Sacral decubitus ulcer with suspected sacrococcygeal osteomyelitis.   Original Report Authenticated By: Charline Bills, M.D.    Labs: BMET  Lab 05/06/12 0700 05/05/12 0450 05/04/12 0418 05/03/12 0559 05/02/12 0500 05/01/12 1745  NA 141 140 138 142 136 138  K 4.9 4.9 4.7 4.3 3.8 3.7  CL 105 105 104 107 102 103  CO2 26 24 23 24 22 23   GLUCOSE 124* 174* 370* 95 114* 139*  BUN 37* 36* 40* 37* 39* 39*  CREATININE 2.54* 2.53* 2.69* 2.69* 2.65* 2.55*  ALB -- -- -- -- -- --  CALCIUM 9.9 9.5 8.6 8.9 8.8 9.1  PHOS 4.3 5.2* 4.9* 5.9* 4.9* 4.5   CBC  Lab 05/06/12 0700 05/05/12 0450 05/04/12 1800 05/04/12 0418 05/02/12 0500 05/01/12 1745  WBC  10.5 9.9 7.3 8.2 -- --  NEUTROABS -- -- -- 6.1 7.5 9.4*  HGB 8.5* 8.2* 7.7* 6.6* -- --  HCT 24.9* 24.0* 22.5* 19.6* -- --  MCV 80.8 82.5 81.8 82.0 -- --  PLT 100* 87* 71* 54* -- --    Medications:       . aspirin EC  81 mg Oral Daily  . Chlorhexidine Gluconate Cloth  6 each Topical  Q0600  . DAPTOmycin (CUBICIN)  IV  420 mg Intravenous Q48H  . darbepoetin (ARANESP) injection - NON-DIALYSIS  100 mcg Subcutaneous Q Sun-1800  . docusate sodium  100 mg Oral BID  . feeding supplement  237 mL Oral BID BM  . insulin aspart  0-20 Units Subcutaneous TID WC  . insulin aspart  0-5 Units Subcutaneous QHS  . meropenem (MERREM) IV  500 mg Intravenous Q12H  . multivitamin with minerals  1 tablet Oral Daily  . mupirocin ointment  1 application Nasal BID  . neomycin-bacitracin-polymyxin   Topical Daily  . nutrition supplement  1 packet Oral BID BM  . oxymorphone  40 mg Oral TID  . pantoprazole  80 mg Oral Q1200  . pregabalin  25 mg Oral BID  . senna  1 tablet Oral BID  . sodium chloride  10-40 mL Intracatheter Q12H    Assessment/ Plan:   Pt is a 51 y.o. yo male with a PMHx of Depression, anxiety, HTN, GERD, PNA, Osteo, Prostate cancer, neurogenic bladder and UTI, who was transferred to [facility] on 05/01/2012 for evaluation of worsening renal function after being treated for fever/sepsis, osteomyelitis, sacral wound, paraplegia, and chronic pain at [facility].   ARF- Patient has normal renal function at baseline with a Creatinine of 0.9 in early August 2013. Differential diagnoses for ARF include Vancomycin toxicity after extended use, volume depletion, UTI and sepsis.  Electrolytes were relatively unremarkable at time of transfer as well. Currently no signs of uremia.  - Labs at transfer to [facility] showed Cr of 2.55 (which has been a gradual increase over the last week or so.)  - Lyrica and MS contin d/c'd 9/21 secondary to sedation. Now receiving Opana and Ativan, which are also sedating. - Strict I/O; PO ad lib  - Current foley has been in place since Sept 10. (Does have chronic foley from neurogenic bladder) - Repeat labs in the AM - Today, Cr is 2.54 which is improving from a few days ago, but about the same as yesterday. Patient with remarkable UOP of 7.85L/24hours. Likely  diuresis phase of recovery - Phos elevated at 5.2, consider starting Phoslo  Anemia- Patient reports 4 units PRBC transfused at OSH (2 units reported) and remains anemic. Transfused 2 units on 05/01/12. HgB 9/21 6.6, again s/p another 2 units of blood. His platelet count also low at admission with count of 17. Received platelets yesterday as well and now up to 54. - Iron panel shows low iron stores. Given IV iron yesterday - Monitor for signs of bleeding not sure etiology of thrombocytopenia and severe anemia- apparently HIT was negative at [place].   - Per primary team, transfuse as needed. Aranesp added as well.  - Consider heme consult given his severe anemia and thrombocytopenia with no clear reason  Infection- Osteomyelitis, bacteremia and sacral ulcer. No longer on Vanc, transitioned to Daptomycin then transferred to stepdown on 9/20 for concern for sepsis - Ortho consult for sacral ulcer and osteomyelitis - Continue Dapto and Merrem, per ID - Treated yeast in urine  with Diflucan x3 days; recheck Urinalysis looks better after treatment  DVT PPX - SCD   Amber M. Hairford, MD 05/06/2012, 8:27 AM   I have seen and examined this patient and agree with the plan of care seen and eval. Refuses exam, paranoid.  Cr stable, spont diuresis.  Cont ivf.  ??if will recover. .  Jah Alarid L 05/06/2012, 10:43 AM

## 2012-05-06 NOTE — Progress Notes (Addendum)
INFECTIOUS DISEASE PROGRESS NOTE  ID: Clarence Sims is a 51 y.o. male with   Active Problems:  Bacteremia associated with intravascular line  Paraplegia following spinal cord injury  Neurogenic bladder  Neurogenic bowel  Sacral decubitus ulcer, stage IV  Osteomyelitis, chronic, pelvic region  Acute renal failure  Anemia  Thrombocytopenia  Fever  GERD (gastroesophageal reflux disease)  Chronic indwelling foley catheter  Leukocytosis  Burn erythema  Sepsis  Sepsis associated hypotension  Osteomyelitis of spine  Subjective: agitated  Abtx:  Anti-infectives     Start     Dose/Rate Route Frequency Ordered Stop   05/03/12 1530   meropenem (MERREM) 500 mg in sodium chloride 0.9 % 50 mL IVPB        500 mg 100 mL/hr over 30 Minutes Intravenous Every 12 hours 05/03/12 1438     05/02/12 1100   piperacillin-tazobactam (ZOSYN) IVPB 3.375 g  Status:  Discontinued        3.375 g 12.5 mL/hr over 240 Minutes Intravenous Every 8 hours 05/02/12 1039 05/03/12 1438   05/02/12 1100   fluconazole (DIFLUCAN) tablet 200 mg        200 mg Oral Daily 05/02/12 1044 05/04/12 0946   05/01/12 1800   DAPTOmycin (CUBICIN) 420 mg in sodium chloride 0.9 % IVPB        420 mg 216.8 mL/hr over 30 Minutes Intravenous Every 48 hours 05/01/12 1624            Medications:  Scheduled:   . aspirin EC  81 mg Oral Daily  . Chlorhexidine Gluconate Cloth  6 each Topical Q0600  . DAPTOmycin (CUBICIN)  IV  420 mg Intravenous Q48H  . darbepoetin (ARANESP) injection - NON-DIALYSIS  100 mcg Subcutaneous Q Sun-1800  . docusate sodium  100 mg Oral BID  . feeding supplement  237 mL Oral BID BM  . insulin aspart  0-20 Units Subcutaneous TID WC  . insulin aspart  0-5 Units Subcutaneous QHS  . meropenem (MERREM) IV  500 mg Intravenous Q12H  . multivitamin with minerals  1 tablet Oral Daily  . mupirocin ointment  1 application Nasal BID  . neomycin-bacitracin-polymyxin   Topical Daily  . nutrition  supplement  1 packet Oral BID BM  . oxymorphone  40 mg Oral TID  . pantoprazole  80 mg Oral Q1200  . pregabalin  25 mg Oral BID  . senna  1 tablet Oral BID  . sodium chloride  10-40 mL Intracatheter Q12H    Objective: Vital signs in last 24 hours: Temp:  [98.1 F (36.7 C)-98.7 F (37.1 C)] 98.5 F (36.9 C) (09/23 1914) Pulse Rate:  [74-91] 74  (09/23 0633) Resp:  [13-17] 16  (09/23 0633) BP: (139-148)/(70-99) 144/70 mmHg (09/23 0633) SpO2:  [97 %-98 %] 97 % (09/23 0736)   General appearance: alert and moderate distress Resp: clear to auscultation bilaterally Cardio: regular rate and rhythm GI: normal findings: bowel sounds normal and soft, non-tender Incision/Wound: sacral wound packed with sponge. LUE PIC dressing loosening.    Lab Results  Basename 05/06/12 0700 05/05/12 0450  WBC 10.5 9.9  HGB 8.5* 8.2*  HCT 24.9* 24.0*  NA 141 140  K 4.9 4.9  CL 105 105  CO2 26 24  BUN 37* 36*  CREATININE 2.54* 2.53*  GLU -- --   Liver Panel  Basename 05/06/12 0700 05/05/12 0450  PROT -- --  ALBUMIN 3.1* 2.8*  AST -- --  ALT -- --  ALKPHOS -- --  BILITOT -- --  BILIDIR -- --  IBILI -- --   Sedimentation Rate No results found for this basename: ESRSEDRATE in the last 72 hours C-Reactive Protein No results found for this basename: CRP:2 in the last 72 hours  Microbiology: Recent Results (from the past 240 hour(s))  URINE CULTURE     Status: Normal   Collection Time   05/01/12  4:31 PM      Component Value Range Status Comment   Specimen Description URINE, CATHETERIZED   Final    Special Requests ADDED 05/01/12 1843   Final    Culture  Setup Time 05/01/2012 19:09   Final    Colony Count NO GROWTH   Final    Culture NO GROWTH   Final    Report Status 05/02/2012 FINAL   Final   CULTURE, BLOOD (ROUTINE X 2)     Status: Normal (Preliminary result)   Collection Time   05/01/12  6:45 PM      Component Value Range Status Comment   Specimen Description BLOOD LEFT HAND    Final    Special Requests BOTTLES DRAWN AEROBIC AND ANAEROBIC 10CC   Final    Culture  Setup Time 05/02/2012 01:02   Final    Culture     Final    Value:        BLOOD CULTURE RECEIVED NO GROWTH TO DATE CULTURE WILL BE HELD FOR 5 DAYS BEFORE ISSUING A FINAL NEGATIVE REPORT   Report Status PENDING   Incomplete   CULTURE, BLOOD (ROUTINE X 2)     Status: Normal (Preliminary result)   Collection Time   05/01/12  7:24 PM      Component Value Range Status Comment   Specimen Description BLOOD RIGHT HAND   Final    Special Requests BOTTLES DRAWN AEROBIC AND ANAEROBIC 10CC   Final    Culture  Setup Time 05/02/2012 01:02   Final    Culture     Final    Value:        BLOOD CULTURE RECEIVED NO GROWTH TO DATE CULTURE WILL BE HELD FOR 5 DAYS BEFORE ISSUING A FINAL NEGATIVE REPORT   Report Status PENDING   Incomplete   MRSA PCR SCREENING     Status: Abnormal   Collection Time   05/04/12  6:09 AM      Component Value Range Status Comment   MRSA by PCR POSITIVE (*) NEGATIVE Final     Studies/Results: Ct Abdomen Pelvis Wo Contrast  05/04/2012  *RADIOLOGY REPORT*  Clinical Data: Anemia, evaluate for retroperitoneal hemorrhage  CT ABDOMEN AND PELVIS WITHOUT CONTRAST  Technique:  Multidetector CT imaging of the abdomen and pelvis was performed following the standard protocol without intravenous contrast.  Comparison: [place] CT abdomen/pelvis dated 03/31/2012. Correlation with MRI thoracic spine dated 05/02/2012  Findings: Trace bilateral pleural effusions with associated dependent atelectasis.  Blood pole is hypodense relative to myocardium, corresponding to known anemia.  Unenhanced liver, pancreas, and adrenal glands are within normal limits.  Spleen is mildly enlarged, measuring 13.9 cm in craniocaudal dimension.  Gallbladder is notable for apparent wall thickening, although this is favored to be secondary to underdistension.  No intrahepatic or extrahepatic ductal dilatation.  5 mm nonobstructing left renal  calculus (series 2/image 41).  Right kidney is unremarkable.  No hydronephrosis.  No evidence of bowel obstruction.  Normal appendix.  Atherosclerotic calcifications of the abdominal aorta and branch vessels.  IVC filter.  No abdominopelvic ascites.  No suspicious abdominopelvic  lymphadenopathy.  No evidence of retroperitoneal hemorrhage.  Bladder is mildly thick-walled but underdistended.  Indwelling Foley catheter.  Prostate is unremarkable.  Again seen is a sacral decubitus ulcer with a suspected cortical irregularity involving the distal sacrum/coccyx (see sagittal image 58), likely reflecting osteomyelitis.  Associated presacral soft tissue stranding (series 2/image 73), new/increased.  Again seen is near complete destruction of the T10 vertebral body with associated destructive changes at T9 and T11, compatible with acute osteomyelitis as noted on recent MRI.  IMPRESSION: No evidence of retroperitoneal hemorrhage.  Mild splenomegaly.  Near complete destruction of the T10 vertebral body with known osteomyelitis.  Sacral decubitus ulcer with suspected sacrococcygeal osteomyelitis.   Original Report Authenticated By: Charline Bills, M.D.      Assessment/Plan: Delirium (has bipolar per family) Acute osteomyelitis T9-10 with destruction of T10 bone  Sacral decubitus  Prev c-spine surgery,  Infection of previous surgical site  ARF  funguria  Total days of antibiotics 36  cubicin Day 5  merrem day 4  Would-  No change in cubicin for now, watch CK (nl yesterday)  Cont Merrem  Hopefully he can be re-eval after psych sees him and he is less combative and agitated.     [name] Infectious Diseases [phone] 05/06/2012, 2:06 PM   LOS: 5 days

## 2012-05-06 NOTE — Progress Notes (Signed)
PATIENT DETAILS Name: Clarence Sims Age: 51 y.o. Sex: male Date of Birth: 1961/01/21 Admit Date: 05/01/2012 Admitting Physician Altha Harm, MD ZOX:[id number], MD  Subjective: Earlier this am-was SOB,however got agitated during the latter half of morning. Currently in restraints, and does clearly state that he will be more respectful and not violent to the nursing staff.  Assessment/Plan: Active Problems: Osteomyelitis - chronic, pelvic region + acute T9-T10  -chronic osteomyelitis of the sacrum - patient did not complete MRI to look at the lumbar spine/sacrum secondary to claustrophobia but MRI of T spine confirmed T9-T10 involvement - ID following and directing abx - CT reveals near complete destruction of T10 vertebra - pt is non-ambulatory/bed bound at this time - will need to have brace if he progresses to the point of attempting upright posture again  Sacral decubitus ulcer, stage IV:  -Patient has a healing stage IV sacral decubitus - Patient had wound VAC management at [facility] - our wound ostomy nurse feels that [facility] is contraindicated in the patient due to the osteomyelitis - patient is insistent on keeping the wound VAC in place -did call [facility] to see patient-however patient agitated and not willing to get further evaluation.  Bacteremia -antibiotics per ID  Acute renal failure:  -multifactorial likely associated with low blood pressures, vancomycin toxicity and volume depletion  -Nephrology following  funguria  -Has completed a course of diflucan  Anemia:  -Hb stable today -No evidence of active blood loss - no evidence of RPH on CT of abdom - Hgb improved S/P transfusion of 2 units of packed red blood cells here 05/02/2012, and anther unit 05/04/2012 - by report he received 2 units of packed red blood cells at [facility] - Serum iron is critically low, so was given IV iron  Paranoia -paranoid, agitated at times-requiring restraints -will  get psych to evaluate -spoke to spouse at bedside-explained not ready for d/c yet  Chronic pain:  -c/w opana -home regimen -c/w prn Dialudid  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:  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 w/neurogenic bladder and neurogenic bowel - has an indwelling Foley catheter   Borderline B12 level  Supplemented empirically - recheck B12 level in 6-8 weeks  Burn erythema:  small area on the right upper thigh where he accidentally burned the skin secondary to decreased sensation - Antibiotic ointment recommended by wound ostomy care  Thrombocytopenia:  received one unit of platelets - counts cont to improve, count stable   Disposition: Remain inpatient  DVT Prophylaxis: SCD's  Code Status: Full code   Procedures:  None  CONSULTS:  ID, nephrology and general surgery  PHYSICAL EXAM: Vital signs in last 24 hours: Filed Vitals:   05/05/12 1730 05/05/12 2041 05/06/12 0633 05/06/12 0736  BP: 146/95 148/93 144/70   Pulse: 91 80 74   Temp:  98.7 F (37.1 C) 98.5 F (36.9 C)   TempSrc:  Oral Oral   Resp: 17 16 16    Height:      Weight:      SpO2: 98% 97% 98% 97%    Weight change:  Body mass index is 26.94 kg/(m^2).   Gen Exam: Awake  with clear speech.   Neck: Supple, No JVD.  Chest: B/L Clear.   CVS: S1 S2 Regular, no murmurs.  Abdomen: soft, BS +, non tender, non distended.  Extremities: no edema, lower extremities warm to touch. Neurologic: Paraplegic   Skin: No Rash.  Intake/Output from previous day:  Intake/Output Summary (Last 24 hours) at 05/06/12 1326 Last data filed at 05/06/12 1100  Gross per 24 hour  Intake  698.4 ml  Output   6050 ml  Net -5351.6 ml     LAB RESULTS: CBC  Lab 05/06/12 0700 05/05/12 0450 05/04/12 1800 05/04/12 0418 05/03/12 0557 05/02/12 0500 05/01/12 1745  WBC 10.5 9.9 7.3 8.2 11.3* -- --  HGB 8.5* 8.2* 7.7* 6.6* 7.7* -- --  HCT 24.9*  24.0* 22.5* 19.6* 22.9* -- --  PLT 100* 87* 71* 54* 45* -- --  MCV 80.8 82.5 81.8 82.0 80.4 -- --  MCH 27.6 28.2 28.0 27.6 27.0 -- --  MCHC 34.1 34.2 34.2 33.7 33.6 -- --  RDW 18.5* 19.2* 19.3* 20.1* 19.9* -- --  LYMPHSABS -- -- -- 1.2 -- 2.2 1.9  MONOABS -- -- -- 0.7 -- 1.2* 1.3*  EOSABS -- -- -- 0.3 -- 0.3 0.2  BASOSABS -- -- -- 0.0 -- 0.0 0.1  BANDABS -- -- -- -- -- -- --    Chemistries   Lab 05/06/12 0700 05/05/12 0450 05/04/12 0418 05/03/12 0559 05/02/12 0500  NA 141 140 138 142 136  K 4.9 4.9 4.7 4.3 3.8  CL 105 105 104 107 102  CO2 26 24 23 24 22   GLUCOSE 124* 174* 370* 95 114*  BUN 37* 36* 40* 37* 39*  CREATININE 2.54* 2.53* 2.69* 2.69* 2.65*  CALCIUM 9.9 9.5 8.6 8.9 8.8  MG -- -- -- -- --    CBG:  Lab 05/06/12 1109 05/06/12 0631 05/05/12 2228 05/05/12 2150 05/05/12 1634  GLUCAP 113* 128* 94 69* 153*    GFR Estimated Creatinine Clearance: 30.7 ml/min (by C-G formula based on Cr of 2.54).  Coagulation profile No results found for this basename: INR:5,PROTIME:5 in the last 168 hours  Cardiac Enzymes No results found for this basename: CK:3,CKMB:3,TROPONINI:3,MYOGLOBIN:3 in the last 168 hours  No components found with this basename: POCBNP:3 No results found for this basename: DDIMER:2 in the last 72 hours  Basename 05/04/12 0730  HGBA1C 5.7*   No results found for this basename: CHOL:2,HDL:2,LDLCALC:2,TRIG:2,CHOLHDL:2,LDLDIRECT:2 in the last 72 hours No results found for this basename: TSH,T4TOTAL,FREET3,T3FREE,THYROIDAB in the last 72 hours No results found for this basename: VITAMINB12:2,FOLATE:2,FERRITIN:2,TIBC:2,IRON:2,RETICCTPCT:2 in the last 72 hours No results found for this basename: LIPASE:2,AMYLASE:2 in the last 72 hours  Urine Studies No results found for this basename: UACOL:2,UAPR:2,USPG:2,UPH:2,UTP:2,UGL:2,UKET:2,UBIL:2,UHGB:2,UNIT:2,UROB:2,ULEU:2,UEPI:2,UWBC:2,URBC:2,UBAC:2,CAST:2,CRYS:2,UCOM:2,BILUA:2 in the last 72  hours  MICROBIOLOGY: Recent Results (from the past 240 hour(s))  URINE CULTURE     Status: Normal   Collection Time   05/01/12  4:31 PM      Component Value Range Status Comment   Specimen Description URINE, CATHETERIZED   Final    Special Requests ADDED 05/01/12 1843   Final    Culture  Setup Time 05/01/2012 19:09   Final    Colony Count NO GROWTH   Final    Culture NO GROWTH   Final    Report Status 05/02/2012 FINAL   Final   CULTURE, BLOOD (ROUTINE X 2)     Status: Normal (Preliminary result)   Collection Time   05/01/12  6:45 PM      Component Value Range Status Comment   Specimen Description BLOOD LEFT HAND   Final    Special Requests BOTTLES DRAWN AEROBIC AND ANAEROBIC 10CC   Final    Culture  Setup Time 05/02/2012 01:02   Final    Culture  Final    Value:        BLOOD CULTURE RECEIVED NO GROWTH TO DATE CULTURE WILL BE HELD FOR 5 DAYS BEFORE ISSUING A FINAL NEGATIVE REPORT   Report Status PENDING   Incomplete   CULTURE, BLOOD (ROUTINE X 2)     Status: Normal (Preliminary result)   Collection Time   05/01/12  7:24 PM      Component Value Range Status Comment   Specimen Description BLOOD RIGHT HAND   Final    Special Requests BOTTLES DRAWN AEROBIC AND ANAEROBIC 10CC   Final    Culture  Setup Time 05/02/2012 01:02   Final    Culture     Final    Value:        BLOOD CULTURE RECEIVED NO GROWTH TO DATE CULTURE WILL BE HELD FOR 5 DAYS BEFORE ISSUING A FINAL NEGATIVE REPORT   Report Status PENDING   Incomplete   MRSA PCR SCREENING     Status: Abnormal   Collection Time   05/04/12  6:09 AM      Component Value Range Status Comment   MRSA by PCR POSITIVE (*) NEGATIVE Final     RADIOLOGY STUDIES/RESULTS: Ct Abdomen Pelvis Wo Contrast  05/04/2012  *RADIOLOGY REPORT*  Clinical Data: Anemia, evaluate for retroperitoneal hemorrhage  CT ABDOMEN AND PELVIS WITHOUT CONTRAST  Technique:  Multidetector CT imaging of the abdomen and pelvis was performed following the standard protocol  without intravenous contrast.  Comparison: [place] CT abdomen/pelvis dated 03/31/2012. Correlation with MRI thoracic spine dated 05/02/2012  Findings: Trace bilateral pleural effusions with associated dependent atelectasis.  Blood pole is hypodense relative to myocardium, corresponding to known anemia.  Unenhanced liver, pancreas, and adrenal glands are within normal limits.  Spleen is mildly enlarged, measuring 13.9 cm in craniocaudal dimension.  Gallbladder is notable for apparent wall thickening, although this is favored to be secondary to underdistension.  No intrahepatic or extrahepatic ductal dilatation.  5 mm nonobstructing left renal calculus (series 2/image 41).  Right kidney is unremarkable.  No hydronephrosis.  No evidence of bowel obstruction.  Normal appendix.  Atherosclerotic calcifications of the abdominal aorta and branch vessels.  IVC filter.  No abdominopelvic ascites.  No suspicious abdominopelvic lymphadenopathy.  No evidence of retroperitoneal hemorrhage.  Bladder is mildly thick-walled but underdistended.  Indwelling Foley catheter.  Prostate is unremarkable.  Again seen is a sacral decubitus ulcer with a suspected cortical irregularity involving the distal sacrum/coccyx (see sagittal image 58), likely reflecting osteomyelitis.  Associated presacral soft tissue stranding (series 2/image 73), new/increased.  Again seen is near complete destruction of the T10 vertebral body with associated destructive changes at T9 and T11, compatible with acute osteomyelitis as noted on recent MRI.  IMPRESSION: No evidence of retroperitoneal hemorrhage.  Mild splenomegaly.  Near complete destruction of the T10 vertebral body with known osteomyelitis.  Sacral decubitus ulcer with suspected sacrococcygeal osteomyelitis.   Original Report Authenticated By: Charline Bills, M.D.    Mr Thoracic Spine Wo Contrast  05/03/2012  *RADIOLOGY REPORT*  Clinical Data:  51 year old male with paraplegia, fevers.  Renal  insufficiency precludes IV contrast.  Comparison: CT abdomen pelvis 03/31/2012.  MRI THORACIC SPINE WITHOUT CONTRAST  Technique: Multiplanar and multiecho pulse sequences of the thoracic spine were obtained without intravenous contrast.  Findings:  Limited sagittal imaging of the cervical spine suggests a bone marrow heterogeneity at the C3-C4 endplate.  This is of unclear significance and there is no definite prevertebral or paraspinal inflammation at this level.  Because of motion artifact, it is difficult to confirm the cervical spine numbering utilized on these images.  The abnormal lower thoracic levels seen on the recent CT seems to be the T9-T10 level.  This does appear to be the only level of possible remote spinal injury, with bulky heterotopic ossification contributing to multifactorial spinal stenosis.  There is spinal cord mass effect and increased T2 and STIR signal abnormality here compatible with chronic cord injury and myelomalacia in the setting of chronic paraplegia. However, there is abnormal marrow edema at this level and increased T2 and STIR signal within the former disc space.  Mild associated paraspinal inflammation is noted bilaterally.  No definite epidural collection or dural thickening.  Other thoracic levels appear within normal limits.  The thoracic spinal cord elsewhere appears grossly normal.  The conus medullaris is better seen on the lumbar study below.   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  Stable other visualized thoracic and upper abdominal viscera.  IMPRESSION: 1.  The abnormal lower thoracic level as seen on the recent CT is designated T9-T10 and shows evidence of acute osteomyelitis as well as remote injury. 2.  No associated epidural abscess. 3.  Other thoracic levels appear within normal limits.  See lumbar findings below.  MRI LUMBAR SPINE WITHOUT CONTRAST  Technique: Multiplanar and multiecho pulse sequences of the  lumbar spine were obtained without intravenous contrast.  Findings:  The patient refused axial lumbar imaging.  The numbering system in the cervical and thoracic spine results in transitional lumbosacral anatomy with a sacralized L5 level. Correlation with radiographs is recommended prior to any operative intervention.  No STIR imaging of the lumbar spine was obtained.  Lumbar marrow signal appears to be normal except for degenerative endplate changes at L1 and L4-L5.  The conus medullaris appears normal at L1.  Cauda equina nerve roots appear grossly normal.  There is multifactorial mild spinal and mild to moderate foraminal stenosis at L3-L4.  No lumbar epidural collection is evident.  Visualized sacrum to the S3 level appears within normal limits.  Lumbar paraspinal soft tissues are grossly normal.  IMPRESSION: 1. The examination was discontinued prior to completion due to patient claustrophobia. 2.  Degenerative changes in the lumbar spine, but no definite lumbar spinal infection. 3.  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   Original Report Authenticated By: Harley Hallmark, M.D.    Mr Lumbar Spine Wo Contrast  05/03/2012  *RADIOLOGY REPORT*  Clinical Data:  51 year old male with paraplegia, fevers.  Renal insufficiency precludes IV contrast.  Comparison: CT abdomen pelvis 03/31/2012.  MRI THORACIC SPINE WITHOUT CONTRAST  Technique: Multiplanar and multiecho pulse sequences of the thoracic spine were obtained without intravenous contrast.  Findings:  Limited sagittal imaging of the cervical spine suggests a bone marrow heterogeneity at the C3-C4 endplate.  This is of unclear significance and there is no definite prevertebral or paraspinal inflammation at this level.  Because of motion artifact, it is difficult to confirm the cervical spine numbering utilized on these images.  The abnormal lower thoracic levels seen on the recent CT seems to be the T9-T10 level.  This does appear  to be the only level of possible remote spinal injury, with bulky heterotopic ossification contributing to multifactorial spinal stenosis.  There is spinal cord mass effect and increased T2 and STIR signal abnormality here compatible with chronic cord injury and myelomalacia in the setting of chronic paraplegia. However, there is abnormal  marrow edema at this level and increased T2 and STIR signal within the former disc space.  Mild associated paraspinal inflammation is noted bilaterally.  No definite epidural collection or dural thickening.  Other thoracic levels appear within normal limits.  The thoracic spinal cord elsewhere appears grossly normal.  The conus medullaris is better seen on the lumbar study below.   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  Stable other visualized thoracic and upper abdominal viscera.  IMPRESSION: 1.  The abnormal lower thoracic level as seen on the recent CT is designated T9-T10 and shows evidence of acute osteomyelitis as well as remote injury. 2.  No associated epidural abscess. 3.  Other thoracic levels appear within normal limits.  See lumbar findings below.  MRI LUMBAR SPINE WITHOUT CONTRAST  Technique: Multiplanar and multiecho pulse sequences of the lumbar spine were obtained without intravenous contrast.  Findings:  The patient refused axial lumbar imaging.  The numbering system in the cervical and thoracic spine results in transitional lumbosacral anatomy with a sacralized L5 level. Correlation with radiographs is recommended prior to any operative intervention.  No STIR imaging of the lumbar spine was obtained.  Lumbar marrow signal appears to be normal except for degenerative endplate changes at L1 and L4-L5.  The conus medullaris appears normal at L1.  Cauda equina nerve roots appear grossly normal.  There is multifactorial mild spinal and mild to moderate foraminal stenosis at L3-L4.  No lumbar epidural collection is  evident.  Visualized sacrum to the S3 level appears within normal limits.  Lumbar paraspinal soft tissues are grossly normal.  IMPRESSION: 1. The examination was discontinued prior to completion due to patient claustrophobia. 2.  Degenerative changes in the lumbar spine, but no definite lumbar spinal infection. 3.  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   Original Report Authenticated By: Harley Hallmark, M.D.    Dg Chest Port 1 View  05/03/2012  *RADIOLOGY REPORT*  Clinical Data: Hypoxemia.  PORTABLE CHEST - 1 VIEW  Comparison: Multiple priors, most recently 90 [id number].  Findings: There is a right-sided internal jugular central venous catheter with tip terminating in the distal superior vena cava. Lung volumes are low. There is cephalization of the pulmonary vasculature and slight indistinctness of the interstitial markings suggestive of mild pulmonary edema.  No more confluent consolidative airspace disease.  No definite pleural effusions. Heart size appears borderline to mildly enlarged.  Mediastinal contours are slightly distorted by patient rotation to the right and lordotic positioning.  IMPRESSION: 1.  Borderline to mild cardiomegaly with interval development of what appears to represent mild interstitial pulmonary edema.   Original Report Authenticated By: Florencia Reasons, M.D.    Dg Chest Port 1 View  05/01/2012  *RADIOLOGY REPORT*  Clinical Data: Central line placement.  PORTABLE CHEST - 1 VIEW  Comparison: 04/27/2012.  Findings: Right central line tip distal superior vena cava level. No gross pneumothorax.  Central pulmonary vascular prominence.  Tortuous aorta.  Heart size within normal limits.  IMPRESSION: Right central line tip distal superior vena cava level.   Original Report Authenticated By: Fuller Canada, M.D.     MEDICATIONS: Scheduled Meds:   . aspirin EC  81 mg Oral Daily  . Chlorhexidine Gluconate Cloth  6 each Topical Q0600  . DAPTOmycin  (CUBICIN)  IV  420 mg Intravenous Q48H  . darbepoetin (ARANESP) injection - NON-DIALYSIS  100 mcg Subcutaneous Q Sun-1800  . docusate sodium  100  mg Oral BID  . feeding supplement  237 mL Oral BID BM  . insulin aspart  0-20 Units Subcutaneous TID WC  . insulin aspart  0-5 Units Subcutaneous QHS  . meropenem (MERREM) IV  500 mg Intravenous Q12H  . multivitamin with minerals  1 tablet Oral Daily  . mupirocin ointment  1 application Nasal BID  . neomycin-bacitracin-polymyxin   Topical Daily  . nutrition supplement  1 packet Oral BID BM  . oxymorphone  40 mg Oral TID  . pantoprazole  80 mg Oral Q1200  . pregabalin  25 mg Oral BID  . senna  1 tablet Oral BID  . sodium chloride  10-40 mL Intracatheter Q12H   Continuous Infusions:   . sodium chloride 75 mL/hr at 05/05/12 1001   PRN Meds:.acetaminophen, albuterol, bisacodyl, carisoprodol, diphenhydrAMINE, HYDROmorphone (DILAUDID) injection, LORazepam, ondansetron (ZOFRAN) IV, ondansetron, oxyCODONE, polyethylene glycol, sodium chloride  Antibiotics: Anti-infectives     Start     Dose/Rate Route Frequency Ordered Stop   05/03/12 1530   meropenem (MERREM) 500 mg in sodium chloride 0.9 % 50 mL IVPB        500 mg 100 mL/hr over 30 Minutes Intravenous Every 12 hours 05/03/12 1438     05/02/12 1100   piperacillin-tazobactam (ZOSYN) IVPB 3.375 g  Status:  Discontinued        3.375 g 12.5 mL/hr over 240 Minutes Intravenous Every 8 hours 05/02/12 1039 05/03/12 1438   05/02/12 1100   fluconazole (DIFLUCAN) tablet 200 mg        200 mg Oral Daily 05/02/12 1044 05/04/12 0946   05/01/12 1800   DAPTOmycin (CUBICIN) 420 mg in sodium chloride 0.9 % IVPB        420 mg 216.8 mL/hr over 30 Minutes Intravenous Every 48 hours 05/01/12 1624             Jeoffrey Massed, MD  [facility] Pager:[phone]  If 7PM-7AM, please contact night-coverage [email or web address] Password [email or web address] 05/06/2012, 1:26 PM   LOS: 5 days

## 2012-05-06 NOTE — Progress Notes (Signed)
PT THROWING FOLEY BAG, THROWING PUNCHES, TRIED TO THROW IV POLE. MD NOTIFIED, RESTRAINTS ORDERED.

## 2012-05-06 NOTE — Progress Notes (Signed)
PATIENT REFUSED CHEST X-RAY, PATIENT TRIED TO GET HIMSELF OUT OF BED, HE THREW EVERYTHING ALL OVER THE ROOM. NT'S AND RN'S PUT HIM BACK INTO THE BED. PATIENT STATES HE JUST WANTS TO GET OUT OF HERE AND GO HOME.

## 2012-05-06 NOTE — Progress Notes (Signed)
ANTIBIOTIC CONSULT NOTE-PROGRESS NOTE  Pharmacy Consult for Daptomycin Indication: Acute Osteomyelitis T9 - 10 with destruction of T10 bone  Hospital Problems Active Problems:  Bacteremia associated with intravascular line  Paraplegia following spinal cord injury  Neurogenic bladder/bowel  Sacral decubitus ulcer, stage IV  Acute renal failure  Anemia  Thrombocytopenia  GERD   Chronic indwelling foley catheter  Leukocytosis  Sepsis  Sepsis associated hypotension  Osteomyelitis of spine  Allergies No Known Allergies  Patient Measurements: Height: 5\' 3"  (160 cm) Weight: 152 lb 1.6 oz (68.992 kg) IBW/kg (Calculated) : 56.9   Vital Signs: BP 144/70  Pulse 74  Temp 98.5 F (36.9 C) (Oral)  Resp 16  Ht 5\' 3"  (1.6 m)  Wt 152 lb 1.6 oz (68.992 kg)  BMI 26.94 kg/m2  SpO2 97%  Intake/Output from previous day:  09/22 0701 - 09/23 0700 In: 1398.4 [P.O.:480; I.V.:710; IV Piggyback:208.4] Out: 7875 [Urine:7850; Drains:25]  Labs:  Basename 05/06/12 0700 05/05/12 0450 05/04/12 1800 05/04/12 0418  WBC 10.5 9.9 7.3 --  HGB 8.5* 8.2* 7.7* --  PLT 100* 87* 71* --  CREATININE 2.54* 2.53* -- 2.69*   Estimated Creatinine Clearance: 30.7 ml/min (by C-G formula based on Cr of 2.54). BUN/Cr/glu/ALT/AST/amyl/lip:  37/2.54/--/--/--/--/-- (09/23 0700)    Microbiology: Recent Results (from the past 720 hour(s))  URINE CULTURE     Status: Normal   Collection Time   05/01/12  4:31 PM      Component Value Range Status Comment   Specimen Description URINE, CATHETERIZED   Final    Special Requests ADDED 05/01/12 1843   Final    Culture  Setup Time 05/01/2012 19:09   Final    Colony Count NO GROWTH   Final    Culture NO GROWTH   Final    Report Status 05/02/2012 FINAL   Final   CULTURE, BLOOD (ROUTINE X 2)     Status: Normal (Preliminary result)   Collection Time   05/01/12  6:45 PM      Component Value Range Status Comment   Specimen Description BLOOD LEFT HAND   Final    Special  Requests BOTTLES DRAWN AEROBIC AND ANAEROBIC 10CC   Final    Culture  Setup Time 05/02/2012 01:02   Final    Culture     Final    Value:        BLOOD CULTURE RECEIVED NO GROWTH TO DATE CULTURE WILL BE HELD FOR 5 DAYS BEFORE ISSUING A FINAL NEGATIVE REPORT   Report Status PENDING   Incomplete   CULTURE, BLOOD (ROUTINE X 2)     Status: Normal (Preliminary result)   Collection Time   05/01/12  7:24 PM      Component Value Range Status Comment   Specimen Description BLOOD RIGHT HAND   Final    Special Requests BOTTLES DRAWN AEROBIC AND ANAEROBIC 10CC   Final    Culture  Setup Time 05/02/2012 01:02   Final    Culture     Final    Value:        BLOOD CULTURE RECEIVED NO GROWTH TO DATE CULTURE WILL BE HELD FOR 5 DAYS BEFORE ISSUING A FINAL NEGATIVE REPORT   Report Status PENDING   Incomplete   MRSA PCR SCREENING     Status: Abnormal   Collection Time   05/04/12  6:09 AM      Component Value Range Status Comment   MRSA by PCR POSITIVE (*) NEGATIVE Final     Anti-infectives  Anti-infectives     Start     Dose/Rate Route Frequency Ordered Stop   05/03/12 1530   meropenem (MERREM) 500 mg in sodium chloride 0.9 % 50 mL IVPB        500 mg 100 mL/hr over 30 Minutes Intravenous Every 12 hours 05/03/12 1438     05/02/12 1100   piperacillin-tazobactam (ZOSYN) IVPB 3.375 g  Status:  Discontinued        3.375 g 12.5 mL/hr over 240 Minutes Intravenous Every 8 hours 05/02/12 1039 05/03/12 1438   05/02/12 1100   fluconazole (DIFLUCAN) tablet 200 mg        200 mg Oral Daily 05/02/12 1044 05/04/12 0946   05/01/12 1800   DAPTOmycin (CUBICIN) 420 mg in sodium chloride 0.9 % IVPB        420 mg 216.8 mL/hr over 30 Minutes Intravenous Every 48 hours 05/01/12 1624           Assessment:  Abx Day # 36 Abx's, Day # 5 Cubicin, Day # 4 Merrem in patient with Acute T-spine osteo w/destruction of T-10 bone.  S/p Vancomycin and Zosyn for 30 + days prior to transfer to [facility].  Afebrile, WBC 10.5.    Per  Nephrology,patient likely in recovery phase of ARF with 7.87 Liters urine out last 24 hours.   Goal of Therapy:   Antibiotics for infection/cultures adjusted for renal/hepatic function   Plan:   Continue Cubicin and Merrem as per ID plans.  No dose adjustments needed.  Laurena Bering, Pharm.D.  05/06/2012 11:55 AM

## 2012-05-06 NOTE — Consult Note (Signed)
Wound care follow-up:  Offered to change pt's vac dressing to sacrum.  He refused to allow dressing change and stated "I'm not letting anybody touch me or change anything."  Reminded pt he has been having vac dressing changes 3Xweek at home prior to admission.  Warned pt that if he does not allow dressing change at some point sponge will begin adhering to wound bed.  Vac dressing was applied last Thursday.  Pt states he just wants to go home.  He has refused to have surgical assessment for osteomyelitis and has refused to consider other wound dressings. He is hostile and threatening to staff and attempted to climb OOB during consult, despite the fact he has no mobility to legs.  His eyes are dilated and his affect is more wild than previous consult.  Family has taken [facility] vac home, they stated last week.  If he is to discharge home then pt can resume therapy with his machine,although he has been told it is contra-indicated without full assessment for his osteomyelitis. Will not plan to follow further unless re-consulted.  [address] RN, MSN, [employer]  [phone]

## 2012-05-07 LAB — BASIC METABOLIC PANEL
BUN: 43 mg/dL — ABNORMAL HIGH (ref 6–23)
CO2: 24 mEq/L (ref 19–32)
Calcium: 10.4 mg/dL (ref 8.4–10.5)
Chloride: 105 mEq/L (ref 96–112)
Creatinine, Ser: 2.83 mg/dL — ABNORMAL HIGH (ref 0.50–1.35)
GFR calc Af Amer: 29 mL/min — ABNORMAL LOW (ref >90)
GFR calc non Af Amer: 25 mL/min — ABNORMAL LOW (ref >90)
Glucose, Bld: 146 mg/dL — ABNORMAL HIGH (ref 70–99)
Potassium: 4.1 mEq/L (ref 3.5–5.1)
Sodium: 143 mEq/L (ref 135–145)

## 2012-05-07 LAB — CBC
HCT: 27.8 % — ABNORMAL LOW (ref 39.0–52.0)
Hemoglobin: 9.7 g/dL — ABNORMAL LOW (ref 13.0–17.0)
MCH: 28.4 pg (ref 26.0–34.0)
MCHC: 34.9 g/dL (ref 30.0–36.0)
MCV: 81.3 fL (ref 78.0–100.0)
Platelets: 105 10*3/uL — ABNORMAL LOW (ref 150–400)
RBC: 3.42 MIL/uL — ABNORMAL LOW (ref 4.22–5.81)
RDW: 18.5 % — ABNORMAL HIGH (ref 11.5–15.5)
WBC: 16 10*3/uL — ABNORMAL HIGH (ref 4.0–10.5)

## 2012-05-07 LAB — GLUCOSE, CAPILLARY
Glucose-Capillary: 136 mg/dL — ABNORMAL HIGH (ref 70–99)
Glucose-Capillary: 141 mg/dL — ABNORMAL HIGH (ref 70–99)
Glucose-Capillary: 140 mg/dL — ABNORMAL HIGH (ref 70–99)

## 2012-05-07 LAB — CK: Total CK: 167 U/L (ref 7–232)

## 2012-05-07 MED ORDER — LORAZEPAM 2 MG/ML IJ SOLN
1.0000 mg | Freq: Once | INTRAMUSCULAR | Status: AC
  Administered 2012-05-07: 1 mg via INTRAVENOUS

## 2012-05-07 MED ORDER — LORAZEPAM 2 MG/ML IJ SOLN
2.0000 mg | Freq: Four times a day (QID) | INTRAMUSCULAR | Status: DC | PRN
  Filled 2012-05-07 (×3): qty 1

## 2012-05-07 MED ORDER — LORAZEPAM 2 MG/ML IJ SOLN
2.0000 mg | INTRAMUSCULAR | Status: DC | PRN
  Administered 2012-05-07 – 2012-05-08 (×3): 2 mg via INTRAMUSCULAR
  Filled 2012-05-07 (×2): qty 1
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
## 2012-05-07 NOTE — Progress Notes (Signed)
Nutrition Follow-up  Intervention:   1.  Nutrition support; pt dx with severe malnutrition of chronic illness on admission for severe wt loss and decreased intake.  Pt has been unable to demonstrate improvement in intake- refusing meals and supplements.  Unlikely pt will be able to meet nutrition needs given current MS and intake.  Recommend consideration of nutrition support.  Enteral nutrition preferred to promote GI integrity and tolerance.  Please consult RD if recommendations warranted. If parenteral preferred by MD, management per PharmD.  Assessment:   Pt remains confused and unable to participate in discussion re: nutrition status.  Pt has been refusing all meals and supplements; argumentative and in restraints.    Pt with increased variability in blood glucose with Regular diet and Ensure.  Since he has been refusing meals and supplements, his glucose has been trending down, one episode of hypoglycemia noted.  Note previous assessment conducted by clinical nutrition staff as which time wife noted pt with severe wt loss of 22% in 4 months and pt with stage 4 sacral decubitus.   Pt met dx criteria for severe malnutrition of chronic illness which is currently unchanged.  Diet Order:  CHO Mod Medium  Meds: Scheduled Meds:   . aspirin EC  81 mg Oral Daily  . Chlorhexidine Gluconate Cloth  6 each Topical Q0600  . DAPTOmycin (CUBICIN)  IV  420 mg Intravenous Q48H  . darbepoetin (ARANESP) injection - NON-DIALYSIS  100 mcg Subcutaneous Q Sun-1800  . docusate sodium  100 mg Oral BID  . feeding supplement  237 mL Oral BID BM  . insulin aspart  0-20 Units Subcutaneous TID WC  . insulin aspart  0-5 Units Subcutaneous QHS  . LORazepam      . meropenem (MERREM) IV  500 mg Intravenous Q12H  . multivitamin with minerals  1 tablet Oral Daily  . mupirocin ointment  1 application Nasal BID  . neomycin-bacitracin-polymyxin   Topical Daily  . nutrition supplement  1 packet Oral BID BM  .  oxymorphone  40 mg Oral TID  . pantoprazole  80 mg Oral Q1200  . pregabalin  25 mg Oral BID  . senna  1 tablet Oral BID  . sodium chloride  10-40 mL Intracatheter Q12H   Continuous Infusions:   . DISCONTD: sodium chloride 75 mL/hr at 05/05/12 1001   PRN Meds:.acetaminophen, albuterol, bisacodyl, carisoprodol, diphenhydrAMINE, HYDROmorphone (DILAUDID) injection, LORazepam, LORazepam, ondansetron (ZOFRAN) IV, ondansetron, oxyCODONE, polyethylene glycol, sodium chloride  Labs:  CMP     Component Value Date/Time   NA 143 05/07/2012 0635   K 4.1 05/07/2012 0635   CL 105 05/07/2012 0635   CO2 24 05/07/2012 0635   GLUCOSE 146* 05/07/2012 0635   BUN 43* 05/07/2012 0635   CREATININE 2.83* 05/07/2012 0635   CALCIUM 10.4 05/07/2012 0635   ALBUMIN 3.1* 05/06/2012 0700   GFRNONAA 25* 05/07/2012 0635   GFRAA 29* 05/07/2012 0635   CBG (last 3)   Basename 05/07/12 1120 05/07/12 0706 05/06/12 2139  GLUCAP 141* 136* 139*    Intake/Output Summary (Last 24 hours) at 05/07/12 1511 Last data filed at 05/07/12 0915  Gross per 24 hour  Intake    100 ml  Output      1 ml  Net     99 ml    Weight Status:  No new wt  Re-estimated needs:  1775-2000 kcal, 85-100g, 1.8-2.0 L/day  Nutrition Dx:  Inadequate oral intake, ongoing Severe malnutrition of chronic illness, ongoing  Monitor:  1.  Food/Beverage; pt meeting >/= 90% estimated needs. Not met, pt refusing meals. 2.  Wt/wt change; monitor trends.  Ongoing, no new wt 3.  Nutrition-related labs; monitor blood glucose.  Ongoing.   Loyce Dys, MS RD LDN Clinical Inpatient Dietitian Pager: [phone] Weekend/After hours pager: [phone]

## 2012-05-07 NOTE — Clinical Social Work Psych Note (Signed)
Unable to complete due to patient's current AMS and behaviors/restraints needing to calm patient.   Received referral from MD for Psych to see for Schizophrenia. Patient seen this afternoon with RN and patient currently in restriants and yelling out "Somebody help me"  "Somebody come in here and get me out". Patient very belligerent and demanding.  He has been voicing he wants to get out of here, but currently not medically stable to leave nor safe to dc home. Chart reviewed with regards to patient being a direct admit from [facility]. Per notes, only hx is anxiety and depression, however patient per reports and notes has been displaying evidence of paranoia, AVH, aggressive and irritable behavior.  CSW continued to do research on patient and hx as patient is unable to engage, is confused and yelling. He is able to make eye contact and re-directed to stop yelling that the RN is right here to help him. Patient is confronted as he has been refusing medications and treatment all day per RN, but unable to give information as to why he is refusing. RN reintroduced and patient is compliant with CSW at bedside. He calms down and looks out the window. No family is present at this time, his wife has been here but has also left reporting she cannot take it anymore.  Wife's phone number on facesheet is not correct. CSW and RN searched chart, found correct number: [phone] and left number for patient wife to call back with more history regarding these behaviors and plan at DC/if wife wants to continue being the decision maker and care for patient.  Again patient too confused at this time to complete full psych assessment and gauge needs. Wife called and message left. Will follow up and update Psych MD with regards to consult and needs.  Ashley Jacobs, MSW LCSW [phone]
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
## 2012-05-07 NOTE — Progress Notes (Signed)
Daily Renal Progress Note  Subjective:   Patient had some disruptive behavior yesterday and overnight. He is not requiring restraints. No other acute events. Patient has no complaints   Objective:   BP 120/96  Pulse 62  Temp 98.4 F (36.9 C) (Oral)  Resp 18  Ht 5\' 3"  (1.6 m)  Wt 152 lb 1.6 oz (68.992 kg)  BMI 26.94 kg/m2  SpO2 99%  Intake/Output Summary (Last 24 hours) at 05/07/12 0931 Last data filed at 05/07/12 0100  Gross per 24 hour  Intake    340 ml  Output   2200 ml  Net  -1860 ml   Weight change:   Physical Exam: Gen: In bed, awake. Does not answer questions HEENT: AT, [place]. MMM Chest: CTAB while breathing normally. No respiratory distress Heart: Tachycardic. No murmur appreciated Abd: Soft, nontender Ext: PICC RUE now absent with dressing over site. PIV in right upper arm Neuro: Unable to assess due to mental status  Imaging: No results found. Labs: BMET  Lab 05/07/12 0635 05/06/12 0700 05/05/12 0450 05/04/12 0418 05/03/12 0559 05/02/12 0500 05/01/12 1745  NA 143 141 140 138 142 136 138  K 4.1 4.9 4.9 4.7 4.3 3.8 3.7  CL 105 105 105 104 107 102 103  CO2 24 26 24 23 24 22 23   GLUCOSE 146* 124* 174* 370* 95 114* 139*  BUN 43* 37* 36* 40* 37* 39* 39*  CREATININE 2.83* 2.54* 2.53* 2.69* 2.69* 2.65* 2.55*  ALB -- -- -- -- -- -- --  CALCIUM 10.4 9.9 9.5 8.6 8.9 8.8 9.1  PHOS -- 4.3 5.2* 4.9* 5.9* 4.9* 4.5   CBC  Lab 05/07/12 0635 05/06/12 0700 05/05/12 0450 05/04/12 1800 05/04/12 0418 05/02/12 0500 05/01/12 1745  WBC 16.0* 10.5 9.9 7.3 -- -- --  NEUTROABS -- -- -- -- 6.1 7.5 9.4*  HGB 9.7* 8.5* 8.2* 7.7* -- -- --  HCT 27.8* 24.9* 24.0* 22.5* -- -- --  MCV 81.3 80.8 82.5 81.8 -- -- --  PLT 105* 100* 87* 71* -- -- --    Medications:       . aspirin EC  81 mg Oral Daily  . Chlorhexidine Gluconate Cloth  6 each Topical Q0600  . DAPTOmycin (CUBICIN)  IV  420 mg Intravenous Q48H  . darbepoetin (ARANESP) injection - NON-DIALYSIS  100 mcg Subcutaneous Q  Sun-1800  . docusate sodium  100 mg Oral BID  . feeding supplement  237 mL Oral BID BM  . insulin aspart  0-20 Units Subcutaneous TID WC  . insulin aspart  0-5 Units Subcutaneous QHS  . LORazepam      . meropenem (MERREM) IV  500 mg Intravenous Q12H  . multivitamin with minerals  1 tablet Oral Daily  . mupirocin ointment  1 application Nasal BID  . neomycin-bacitracin-polymyxin   Topical Daily  . nutrition supplement  1 packet Oral BID BM  . oxymorphone  40 mg Oral TID  . pantoprazole  80 mg Oral Q1200  . pregabalin  25 mg Oral BID  . senna  1 tablet Oral BID  . sodium chloride  10-40 mL Intracatheter Q12H    Assessment/ Plan:   Pt is a 51 y.o. yo male with a PMHx of Depression, anxiety, HTN, GERD, PNA, Osteo, Prostate cancer, neurogenic bladder and UTI, who was transferred to [facility] on 05/01/2012 for evaluation of worsening renal function after being treated for fever/sepsis, osteomyelitis, sacral wound, paraplegia, and chronic pain at [facility].   ARF- Patient has  normal renal function at baseline with a Creatinine of 0.9 in early August 2013. Differential diagnoses for ARF include Vancomycin toxicity after extended use, volume depletion, UTI and sepsis.  Electrolytes were relatively unremarkable at time of transfer as well. Currently no signs of uremia.  - Labs at transfer to [facility] showed Cr of 2.55 (which has been a gradual increase over the last week or so.)  - Lyrica and MS contin d/c'd 9/21 secondary to sedation. Now receiving Opana and Ativan, which are also sedating. - Strict I/O; PO ad lib  - Current foley has been in place since Sept 10. (Does have chronic foley from neurogenic bladder) - Repeat labs in the AM - Today, Cr is 2.83 which is relatively stable. Patient's UOP 2.2L/24hours - Continue to monitor daily renal panel - Phos elevated at 5.2, consider starting Phoslo - We will continue to follow along, but no indication for intervention at this time.  Anemia-  Patient reports 4 units PRBC transfused at OSH (2 units reported) and remains anemic. Transfused 2 units on 05/01/12. HgB 9/21 6.6, again s/p another 2 units of blood. His platelet count also low at admission with count of 17. Received platelets as well. Currently HgB stable at 9.7 and plt 105 - Iron panel shows low iron stores. Given IV iron x1 - Monitor for signs of bleeding not sure etiology of thrombocytopenia and severe anemia- apparently HIT was negative at [facility].   - Per primary team, transfuse as needed. Aranesp added as well.   Infection- Osteomyelitis, bacteremia and sacral ulcer. No longer on Vanc, transitioned to Daptomycin then transferred to stepdown on 9/20 for concern for sepsis - Ortho consult for sacral ulcer and osteomyelitis - Continue Dapto and Merrem, per ID - Treated yeast in urine with Diflucan x3 days; recheck Urinalysis looks better after treatment  DVT PPX - SCD   Amber M. Hairford, MD 05/07/2012, 9:31 AM I have seen and examined this patient and agree with the plan of care seen and eval.  Not sure will regain a lot of function.  GFR has not changed since arrival.  May have IN and prolonged recovery or perm damage. .  Jessicaann Overbaugh L 05/07/2012, 12:26 PM

## 2012-05-07 NOTE — Progress Notes (Addendum)
[facility] PROGRESS NOTE  Clarence Sims OZH:[id number] DOB: 1961/07/17 DOA: 05/01/2012 PCP: Paulina Fusi, MD  Assessment/Plan: Active Problems:  Bacteremia associated with intravascular line  Paraplegia following spinal cord injury  Neurogenic bladder  Neurogenic bowel  Sacral decubitus ulcer, stage IV  Osteomyelitis, chronic, pelvic region  Acute renal failure  Anemia  Thrombocytopenia  Fever  GERD (gastroesophageal reflux disease)  Chronic indwelling foley catheter  Leukocytosis  Burn erythema  Sepsis  Sepsis associated hypotension  Osteomyelitis of spine    Osteomyelitis - chronic, pelvic region + acute T9-T10  -chronic osteomyelitis of the sacrum - patient did not complete MRI to look at the lumbar spine/sacrum secondary to claustrophobia but MRI of T spine confirmed T9-T10 involvement - ID following and directing abx - CT reveals near complete destruction of T10 vertebra - pt is non-ambulatory/bed bound at this time - will need to have brace if he progresses to the point of attempting upright posture again  Total days of antibiotics 37 cubicin Day 6 , monitor CK merrem day 5   Sacral decubitus ulcer, stage IV:  -Patient has a healing stage IV sacral decubitus - Patient had wound VAC management at [facility] - our wound ostomy nurse feels that [facility] is contraindicated in the patient due to the osteomyelitis - patient is insistent on keeping the wound VAC in place -did call [facility] to see patient-however patient agitated and not willing to get further evaluation.   Bacteremia  -antibiotics per ID   Acute renal failure/worsening:  -multifactorial likely associated with low blood pressures, vancomycin toxicity and volume depletion  -Nephrology following    funguria  -Has completed a course of diflucan   Anemia:  -Hb stable today ,4 units PRBC transfused at OSH   Serum iron is critically low, so was given IV iron , also received RS Monitor for signs  of bleeding not sure etiology of thrombocytopenia and severe anemia- apparently HIT was negative at [facility]  -paranoid, agitated at times-requiring restraints  -will get psych to evaluate , consult requested yesterday -agitated requiring Ativan, for agitation and restraints    Chronic pain:  -c/w opana -home regimen  -c/w prn Dialudid    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:  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 w/neurogenic bladder and neurogenic bowel - has an indwelling Foley catheter   Borderline B12 level  Supplemented empirically - recheck B12 level in 6-8 weeks  Burn erythema:  small area on the right upper thigh where he accidentally burned the skin secondary to decreased sensation - Antibiotic ointment recommended by wound ostomy care  Thrombocytopenia:  received one unit of platelets - counts cont to improve, count stable  Disposition:  Remain inpatient  DVT Prophylaxis:  SCD's   HPI/Subjective: Agitated overnight  Objective: Filed Vitals:   05/06/12 0736 05/06/12 1424 05/06/12 2259 05/07/12 0641  BP:  149/91 119/98 120/96  Pulse:  97 64 62  Temp:  98.9 F (37.2 C) 98.3 F (36.8 C) 98.4 F (36.9 C)  TempSrc:      Resp:  20 16 18   Height:      Weight:      SpO2: 97% 98% 99% 99%    Intake/Output Summary (Last 24 hours) at 05/07/12 1030 Last data filed at 05/07/12 0100  Gross per 24 hour  Intake    340 ml  Output   2200 ml  Net  -1860 ml    Exam:  Gen: In bed,  awake. Does not answer questions  HEENT: AT, [place]. MMM  Chest: CTAB while breathing normally. No respiratory distress  Heart: Tachycardic. No murmur appreciated  Abd: Soft, nontender  Ext: PICC RUE now absent with dressing over site. PIV in right upper arm  Neuro: Unable to assess due to mental status    Data Reviewed: Basic Metabolic Panel:  Lab 05/07/12 1610 05/06/12 0700 05/05/12 0450 05/04/12 0418 05/03/12 0559  05/02/12 0500  NA 143 141 140 138 142 --  K 4.1 4.9 4.9 4.7 4.3 --  CL 105 105 105 104 107 --  CO2 24 26 24 23 24  --  GLUCOSE 146* 124* 174* 370* 95 --  BUN 43* 37* 36* 40* 37* --  CREATININE 2.83* 2.54* 2.53* 2.69* 2.69* --  CALCIUM 10.4 9.9 9.5 8.6 8.9 --  MG -- -- -- -- -- --  PHOS -- 4.3 5.2* 4.9* 5.9* 4.9*    Liver Function Tests:  Lab 05/06/12 0700 05/05/12 0450 05/04/12 0418 05/03/12 0559 05/02/12 0500  AST -- -- -- -- --  ALT -- -- -- -- --  ALKPHOS -- -- -- -- --  BILITOT -- -- -- -- --  PROT -- -- -- -- --  ALBUMIN 3.1* 2.8* 2.6* 2.9* 2.7*   No results found for this basename: LIPASE:5,AMYLASE:5 in the last 168 hours No results found for this basename: AMMONIA:5 in the last 168 hours  CBC:  Lab 05/07/12 0635 05/06/12 0700 05/05/12 0450 05/04/12 1800 05/04/12 0418 05/02/12 0500 05/01/12 1745  WBC 16.0* 10.5 9.9 7.3 8.2 -- --  NEUTROABS -- -- -- -- 6.1 7.5 9.4*  HGB 9.7* 8.5* 8.2* 7.7* 6.6* -- --  HCT 27.8* 24.9* 24.0* 22.5* 19.6* -- --  MCV 81.3 80.8 82.5 81.8 82.0 -- --  PLT 105* 100* 87* 71* 54* -- --    Cardiac Enzymes:  Lab 05/02/12 0500  CKTOTAL 85  CKMB --  CKMBINDEX --  TROPONINI --   BNP (last 3 results) No results found for this basename: PROBNP:3 in the last 8760 hours   CBG:  Lab 05/07/12 0706 05/06/12 2139 05/06/12 1610 05/06/12 1109 05/06/12 0631  GLUCAP 136* 139* 158* 113* 128*    Recent Results (from the past 240 hour(s))  URINE CULTURE     Status: Normal   Collection Time   05/01/12  4:31 PM      Component Value Range Status Comment   Specimen Description URINE, CATHETERIZED   Final    Special Requests ADDED 05/01/12 1843   Final    Culture  Setup Time 05/01/2012 19:09   Final    Colony Count NO GROWTH   Final    Culture NO GROWTH   Final    Report Status 05/02/2012 FINAL   Final   CULTURE, BLOOD (ROUTINE X 2)     Status: Normal (Preliminary result)   Collection Time   05/01/12  6:45 PM      Component Value Range Status  Comment   Specimen Description BLOOD LEFT HAND   Final    Special Requests BOTTLES DRAWN AEROBIC AND ANAEROBIC 10CC   Final    Culture  Setup Time 05/02/2012 01:02   Final    Culture     Final    Value:        BLOOD CULTURE RECEIVED NO GROWTH TO DATE CULTURE WILL BE HELD FOR 5 DAYS BEFORE ISSUING A FINAL NEGATIVE REPORT   Report Status PENDING   Incomplete   CULTURE, BLOOD (ROUTINE  X 2)     Status: Normal (Preliminary result)   Collection Time   05/01/12  7:24 PM      Component Value Range Status Comment   Specimen Description BLOOD RIGHT HAND   Final    Special Requests BOTTLES DRAWN AEROBIC AND ANAEROBIC 10CC   Final    Culture  Setup Time 05/02/2012 01:02   Final    Culture     Final    Value:        BLOOD CULTURE RECEIVED NO GROWTH TO DATE CULTURE WILL BE HELD FOR 5 DAYS BEFORE ISSUING A FINAL NEGATIVE REPORT   Report Status PENDING   Incomplete   MRSA PCR SCREENING     Status: Abnormal   Collection Time   05/04/12  6:09 AM      Component Value Range Status Comment   MRSA by PCR POSITIVE (*) NEGATIVE Final      Studies: Ct Abdomen Pelvis Wo Contrast  05/04/2012  *RADIOLOGY REPORT*  Clinical Data: Anemia, evaluate for retroperitoneal hemorrhage  CT ABDOMEN AND PELVIS WITHOUT CONTRAST  Technique:  Multidetector CT imaging of the abdomen and pelvis was performed following the standard protocol without intravenous contrast.  Comparison: [place] CT abdomen/pelvis dated 03/31/2012. Correlation with MRI thoracic spine dated 05/02/2012  Findings: Trace bilateral pleural effusions with associated dependent atelectasis.  Blood pole is hypodense relative to myocardium, corresponding to known anemia.  Unenhanced liver, pancreas, and adrenal glands are within normal limits.  Spleen is mildly enlarged, measuring 13.9 cm in craniocaudal dimension.  Gallbladder is notable for apparent wall thickening, although this is favored to be secondary to underdistension.  No intrahepatic or extrahepatic ductal  dilatation.  5 mm nonobstructing left renal calculus (series 2/image 41).  Right kidney is unremarkable.  No hydronephrosis.  No evidence of bowel obstruction.  Normal appendix.  Atherosclerotic calcifications of the abdominal aorta and branch vessels.  IVC filter.  No abdominopelvic ascites.  No suspicious abdominopelvic lymphadenopathy.  No evidence of retroperitoneal hemorrhage.  Bladder is mildly thick-walled but underdistended.  Indwelling Foley catheter.  Prostate is unremarkable.  Again seen is a sacral decubitus ulcer with a suspected cortical irregularity involving the distal sacrum/coccyx (see sagittal image 58), likely reflecting osteomyelitis.  Associated presacral soft tissue stranding (series 2/image 73), new/increased.  Again seen is near complete destruction of the T10 vertebral body with associated destructive changes at T9 and T11, compatible with acute osteomyelitis as noted on recent MRI.  IMPRESSION: No evidence of retroperitoneal hemorrhage.  Mild splenomegaly.  Near complete destruction of the T10 vertebral body with known osteomyelitis.  Sacral decubitus ulcer with suspected sacrococcygeal osteomyelitis.   Original Report Authenticated By: Charline Bills, M.D.    Mr Thoracic Spine Wo Contrast  05/03/2012  *RADIOLOGY REPORT*  Clinical Data:  51 year old male with paraplegia, fevers.  Renal insufficiency precludes IV contrast.  Comparison: CT abdomen pelvis 03/31/2012.  MRI THORACIC SPINE WITHOUT CONTRAST  Technique: Multiplanar and multiecho pulse sequences of the thoracic spine were obtained without intravenous contrast.  Findings:  Limited sagittal imaging of the cervical spine suggests a bone marrow heterogeneity at the C3-C4 endplate.  This is of unclear significance and there is no definite prevertebral or paraspinal inflammation at this level.  Because of motion artifact, it is difficult to confirm the cervical spine numbering utilized on these images.  The abnormal lower thoracic  levels seen on the recent CT seems to be the T9-T10 level.  This does appear to be the only level of possible  remote spinal injury, with bulky heterotopic ossification contributing to multifactorial spinal stenosis.  There is spinal cord mass effect and increased T2 and STIR signal abnormality here compatible with chronic cord injury and myelomalacia in the setting of chronic paraplegia. However, there is abnormal marrow edema at this level and increased T2 and STIR signal within the former disc space.  Mild associated paraspinal inflammation is noted bilaterally.  No definite epidural collection or dural thickening.  Other thoracic levels appear within normal limits.  The thoracic spinal cord elsewhere appears grossly normal.  The conus medullaris is better seen on the lumbar study below.   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  Stable other visualized thoracic and upper abdominal viscera.  IMPRESSION: 1.  The abnormal lower thoracic level as seen on the recent CT is designated T9-T10 and shows evidence of acute osteomyelitis as well as remote injury. 2.  No associated epidural abscess. 3.  Other thoracic levels appear within normal limits.  See lumbar findings below.  MRI LUMBAR SPINE WITHOUT CONTRAST  Technique: Multiplanar and multiecho pulse sequences of the lumbar spine were obtained without intravenous contrast.  Findings:  The patient refused axial lumbar imaging.  The numbering system in the cervical and thoracic spine results in transitional lumbosacral anatomy with a sacralized L5 level. Correlation with radiographs is recommended prior to any operative intervention.  No STIR imaging of the lumbar spine was obtained.  Lumbar marrow signal appears to be normal except for degenerative endplate changes at L1 and L4-L5.  The conus medullaris appears normal at L1.  Cauda equina nerve roots appear grossly normal.  There is multifactorial mild spinal and mild to  moderate foraminal stenosis at L3-L4.  No lumbar epidural collection is evident.  Visualized sacrum to the S3 level appears within normal limits.  Lumbar paraspinal soft tissues are grossly normal.  IMPRESSION: 1. The examination was discontinued prior to completion due to patient claustrophobia. 2.  Degenerative changes in the lumbar spine, but no definite lumbar spinal infection. 3.  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   Original Report Authenticated By: Harley Hallmark, M.D.    Mr Lumbar Spine Wo Contrast  05/03/2012  *RADIOLOGY REPORT*  Clinical Data:  51 year old male with paraplegia, fevers.  Renal insufficiency precludes IV contrast.  Comparison: CT abdomen pelvis 03/31/2012.  MRI THORACIC SPINE WITHOUT CONTRAST  Technique: Multiplanar and multiecho pulse sequences of the thoracic spine were obtained without intravenous contrast.  Findings:  Limited sagittal imaging of the cervical spine suggests a bone marrow heterogeneity at the C3-C4 endplate.  This is of unclear significance and there is no definite prevertebral or paraspinal inflammation at this level.  Because of motion artifact, it is difficult to confirm the cervical spine numbering utilized on these images.  The abnormal lower thoracic levels seen on the recent CT seems to be the T9-T10 level.  This does appear to be the only level of possible remote spinal injury, with bulky heterotopic ossification contributing to multifactorial spinal stenosis.  There is spinal cord mass effect and increased T2 and STIR signal abnormality here compatible with chronic cord injury and myelomalacia in the setting of chronic paraplegia. However, there is abnormal marrow edema at this level and increased T2 and STIR signal within the former disc space.  Mild associated paraspinal inflammation is noted bilaterally.  No definite epidural collection or dural thickening.  Other thoracic levels appear within normal limits.  The thoracic  spinal cord  elsewhere appears grossly normal.  The conus medullaris is better seen on the lumbar study below.   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  Stable other visualized thoracic and upper abdominal viscera.  IMPRESSION: 1.  The abnormal lower thoracic level as seen on the recent CT is designated T9-T10 and shows evidence of acute osteomyelitis as well as remote injury. 2.  No associated epidural abscess. 3.  Other thoracic levels appear within normal limits.  See lumbar findings below.  MRI LUMBAR SPINE WITHOUT CONTRAST  Technique: Multiplanar and multiecho pulse sequences of the lumbar spine were obtained without intravenous contrast.  Findings:  The patient refused axial lumbar imaging.  The numbering system in the cervical and thoracic spine results in transitional lumbosacral anatomy with a sacralized L5 level. Correlation with radiographs is recommended prior to any operative intervention.  No STIR imaging of the lumbar spine was obtained.  Lumbar marrow signal appears to be normal except for degenerative endplate changes at L1 and L4-L5.  The conus medullaris appears normal at L1.  Cauda equina nerve roots appear grossly normal.  There is multifactorial mild spinal and mild to moderate foraminal stenosis at L3-L4.  No lumbar epidural collection is evident.  Visualized sacrum to the S3 level appears within normal limits.  Lumbar paraspinal soft tissues are grossly normal.  IMPRESSION: 1. The examination was discontinued prior to completion due to patient claustrophobia. 2.  Degenerative changes in the lumbar spine, but no definite lumbar spinal infection. 3.  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   Original Report Authenticated By: Harley Hallmark, M.D.    Dg Chest Port 1 View  05/03/2012  *RADIOLOGY REPORT*  Clinical Data: Hypoxemia.  PORTABLE CHEST - 1 VIEW  Comparison: Multiple priors, most recently 90 [id number].   Findings: There is a right-sided internal jugular central venous catheter with tip terminating in the distal superior vena cava. Lung volumes are low. There is cephalization of the pulmonary vasculature and slight indistinctness of the interstitial markings suggestive of mild pulmonary edema.  No more confluent consolidative airspace disease.  No definite pleural effusions. Heart size appears borderline to mildly enlarged.  Mediastinal contours are slightly distorted by patient rotation to the right and lordotic positioning.  IMPRESSION: 1.  Borderline to mild cardiomegaly with interval development of what appears to represent mild interstitial pulmonary edema.   Original Report Authenticated By: Florencia Reasons, M.D.    Dg Chest Port 1 View  05/01/2012  *RADIOLOGY REPORT*  Clinical Data: Central line placement.  PORTABLE CHEST - 1 VIEW  Comparison: 04/27/2012.  Findings: Right central line tip distal superior vena cava level. No gross pneumothorax.  Central pulmonary vascular prominence.  Tortuous aorta.  Heart size within normal limits.  IMPRESSION: Right central line tip distal superior vena cava level.   Original Report Authenticated By: Fuller Canada, M.D.     Scheduled Meds:   . aspirin EC  81 mg Oral Daily  . Chlorhexidine Gluconate Cloth  6 each Topical Q0600  . DAPTOmycin (CUBICIN)  IV  420 mg Intravenous Q48H  . darbepoetin (ARANESP) injection - NON-DIALYSIS  100 mcg Subcutaneous Q Sun-1800  . docusate sodium  100 mg Oral BID  . feeding supplement  237 mL Oral BID BM  . insulin aspart  0-20 Units Subcutaneous TID WC  . insulin aspart  0-5 Units Subcutaneous QHS  . LORazepam      . meropenem (MERREM) IV  500 mg  Intravenous Q12H  . multivitamin with minerals  1 tablet Oral Daily  . mupirocin ointment  1 application Nasal BID  . neomycin-bacitracin-polymyxin   Topical Daily  . nutrition supplement  1 packet Oral BID BM  . oxymorphone  40 mg Oral TID  . pantoprazole  80 mg Oral  Q1200  . pregabalin  25 mg Oral BID  . senna  1 tablet Oral BID  . sodium chloride  10-40 mL Intracatheter Q12H   Continuous Infusions:   . DISCONTD: sodium chloride 75 mL/hr at 05/05/12 1001    Active Problems:  Bacteremia associated with intravascular line  Paraplegia following spinal cord injury  Neurogenic bladder  Neurogenic bowel  Sacral decubitus ulcer, stage IV  Osteomyelitis, chronic, pelvic region  Acute renal failure  Anemia  Thrombocytopenia  Fever  GERD (gastroesophageal reflux disease)  Chronic indwelling foley catheter  Leukocytosis  Burn erythema  Sepsis  Sepsis associated hypotension  Osteomyelitis of spine    Time spent: 40 minutes   [facility]  [facility] Pager [phone]. If 8PM-8AM, please contact night-coverage at [email or web address], password [facility] 05/07/2012, 10:30 AM  LOS: 6 days

## 2012-05-07 NOTE — Progress Notes (Signed)
Utilization review completed. Therasa Lorenzi, RN, BSN. 

## 2012-05-08 ENCOUNTER — Inpatient Hospital Stay (HOSPITAL_COMMUNITY): Payer: Medicaid Other

## 2012-05-08 ENCOUNTER — Encounter (HOSPITAL_COMMUNITY): Payer: Self-pay | Admitting: Radiology

## 2012-05-08 DIAGNOSIS — Z9889 Other specified postprocedural states: Secondary | ICD-10-CM

## 2012-05-08 DIAGNOSIS — L89109 Pressure ulcer of unspecified part of back, unspecified stage: Secondary | ICD-10-CM

## 2012-05-08 LAB — CULTURE, BLOOD (ROUTINE X 2)
Culture: NO GROWTH
Culture: NO GROWTH

## 2012-05-08 LAB — GLUCOSE, CAPILLARY
Glucose-Capillary: 105 mg/dL — ABNORMAL HIGH (ref 70–99)
Glucose-Capillary: 120 mg/dL — ABNORMAL HIGH (ref 70–99)
Glucose-Capillary: 141 mg/dL — ABNORMAL HIGH (ref 70–99)
Glucose-Capillary: 161 mg/dL — ABNORMAL HIGH (ref 70–99)

## 2012-05-08 MED ORDER — LORAZEPAM 2 MG/ML IJ SOLN
2.0000 mg | Freq: Once | INTRAMUSCULAR | Status: AC
  Administered 2012-05-09: 2 mg via INTRAMUSCULAR

## 2012-05-08 MED ORDER — CHLORDIAZEPOXIDE HCL 5 MG PO CAPS
10.0000 mg | ORAL_CAPSULE | Freq: Three times a day (TID) | ORAL | Status: DC
  Administered 2012-05-09: 10 mg via ORAL
  Filled 2012-05-08: qty 2

## 2012-05-08 MED ORDER — LORAZEPAM 2 MG/ML IJ SOLN: 2.0000 mg | Freq: Four times a day (QID) | INTRAMUSCULAR | Status: DC | PRN

## 2012-05-08 MED ORDER — HALOPERIDOL LACTATE 5 MG/ML IJ SOLN
1.0000 mg | Freq: Four times a day (QID) | INTRAMUSCULAR | Status: DC | PRN
  Administered 2012-05-08: 1 mg via INTRAVENOUS
  Filled 2012-05-08: qty 1

## 2012-05-08 MED ORDER — BENZTROPINE MESYLATE 0.5 MG PO TABS: 0.5000 mg | ORAL_TABLET | Freq: Two times a day (BID) | ORAL | Status: DC | PRN

## 2012-05-08 MED ORDER — MAGNESIUM OXIDE 400 (241.3 MG) MG PO TABS
800.0000 mg | ORAL_TABLET | Freq: Two times a day (BID) | ORAL | Status: DC
  Administered 2012-05-08: 800 mg via ORAL
  Filled 2012-05-08 (×3): qty 2

## 2012-05-08 MED ORDER — SODIUM CHLORIDE 0.9 % IV SOLN
25.0000 mg | Freq: Once | INTRAVENOUS | Status: AC
  Administered 2012-05-08: 25 mg via INTRAVENOUS
  Filled 2012-05-08: qty 0.5

## 2012-05-08 MED ORDER — OXYCODONE HCL 5 MG PO TABS
5.0000 mg | ORAL_TABLET | Freq: Four times a day (QID) | ORAL | Status: DC | PRN
  Administered 2012-05-08: 5 mg via ORAL
  Filled 2012-05-08: qty 1

## 2012-05-08 MED ORDER — BENZTROPINE MESYLATE 1 MG/ML IJ SOLN: 0.5000 mg | Freq: Two times a day (BID) | INTRAMUSCULAR | Status: DC | PRN

## 2012-05-08 MED ORDER — FERROUS SULFATE 325 (65 FE) MG PO TABS
325.0000 mg | ORAL_TABLET | Freq: Three times a day (TID) | ORAL | Status: DC
  Filled 2012-05-08 (×5): qty 1

## 2012-05-08 MED ORDER — OLANZAPINE 7.5 MG PO TABS: 15.0000 mg | ORAL_TABLET | Freq: Every day | ORAL | Status: DC

## 2012-05-08 MED ORDER — MORPHINE SULFATE 2 MG/ML IJ SOLN
2.0000 mg | INTRAMUSCULAR | Status: DC | PRN
  Administered 2012-05-08 – 2012-05-10 (×2): 2 mg via INTRAVENOUS
  Filled 2012-05-08 (×2): qty 1

## 2012-05-08 MED ORDER — MAGNESIUM SULFATE IN D5W 10-5 MG/ML-% IV SOLN
1.0000 g | Freq: Once | INTRAVENOUS | Status: DC
  Filled 2012-05-08: qty 100

## 2012-05-08 MED ORDER — SODIUM CHLORIDE 0.9 % IV SOLN
1000.0000 mg | Freq: Once | INTRAVENOUS | Status: DC
  Filled 2012-05-08: qty 20

## 2012-05-08 MED ORDER — HEPARIN SODIUM (PORCINE) 5000 UNIT/ML IJ SOLN: 5000.0000 [IU] | Freq: Three times a day (TID) | INTRAMUSCULAR | Status: DC

## 2012-05-08 MED ORDER — OXYMORPHONE HCL ER 10 MG PO T12A
20.0000 mg | EXTENDED_RELEASE_TABLET | Freq: Two times a day (BID) | ORAL | Status: DC
  Administered 2012-05-09 – 2012-05-10 (×4): 20 mg via ORAL
  Filled 2012-05-08 (×2): qty 2
  Filled 2012-05-08: qty 1
  Filled 2012-05-08 (×2): qty 2

## 2012-05-08 MED ORDER — LORAZEPAM 1 MG PO TABS: 2.0000 mg | ORAL_TABLET | Freq: Four times a day (QID) | ORAL | Status: DC | PRN

## 2012-05-08 NOTE — Consult Note (Signed)
Wound care follow-up:  Surgical team in to assess sacral wound.  They agree that vac is contra-indicated until further in-put available from ortho service.  Wet to dry dressing has been ordered until consult and further plan of care available.  Pt much calmer today and no longer insisting on having vac therapy replaced to wound.   Cammie Mcgee, RN, MSN, [employer]  [phone]

## 2012-05-08 NOTE — Progress Notes (Addendum)
Clinical Social Work  CSW attempted to meet with patient but he is unable to complete assessment at this time. CSW attempted to call wife but wife did not answer phone.  A message was left with CSW contact information for wife. CSW attempted to submitt for pasarr but unable to locate patient's social security number. CSW completed FL2 and saved all information in [facility]. Once receiving permission, CSW can fax out to search for SNF. CSW placed DMA form on chart for MD signature which is needed for SNF placement. CSW will continue to follow.  [place], [place] [phone] (Coverage for Lovette Cliche)

## 2012-05-08 NOTE — Progress Notes (Signed)
INFECTIOUS DISEASE PROGRESS NOTE  ID: Clarence Sims is a 51 y.o. male with   Active Problems:  Bacteremia associated with intravascular line  Paraplegia following spinal cord injury  Neurogenic bladder  Neurogenic bowel  Sacral decubitus ulcer, stage IV  Osteomyelitis, chronic, pelvic region  Acute renal failure  Anemia  Thrombocytopenia  Fever  GERD (gastroesophageal reflux disease)  Chronic indwelling foley catheter  Leukocytosis  Burn erythema  Sepsis  Sepsis associated hypotension  Osteomyelitis of spine  Subjective: Agitated, wants to leave  Abtx:  Anti-infectives     Start     Dose/Rate Route Frequency Ordered Stop   05/03/12 1530   meropenem (MERREM) 500 mg in sodium chloride 0.9 % 50 mL IVPB        500 mg 100 mL/hr over 30 Minutes Intravenous Every 12 hours 05/03/12 1438     05/02/12 1100   piperacillin-tazobactam (ZOSYN) IVPB 3.375 g  Status:  Discontinued        3.375 g 12.5 mL/hr over 240 Minutes Intravenous Every 8 hours 05/02/12 1039 05/03/12 1438   05/02/12 1100   fluconazole (DIFLUCAN) tablet 200 mg        200 mg Oral Daily 05/02/12 1044 05/04/12 0946   05/01/12 1800   DAPTOmycin (CUBICIN) 420 mg in sodium chloride 0.9 % IVPB        420 mg 216.8 mL/hr over 30 Minutes Intravenous Every 48 hours 05/01/12 1624            Medications:  Scheduled:   . aspirin EC  81 mg Oral Daily  . Chlorhexidine Gluconate Cloth  6 each Topical Q0600  . DAPTOmycin (CUBICIN)  IV  420 mg Intravenous Q48H  . darbepoetin (ARANESP) injection - NON-DIALYSIS  100 mcg Subcutaneous Q Sun-1800  . docusate sodium  100 mg Oral BID  . feeding supplement  237 mL Oral BID BM  . ferrous sulfate  325 mg Oral TID WC  . insulin aspart  0-20 Units Subcutaneous TID WC  . insulin aspart  0-5 Units Subcutaneous QHS  . iron dextran (INFED/DEXFERRUM) infusion  25 mg Intravenous Once   Followed by  . iron dextran (INFED/DEXFERRUM) infusion  1,000 mg Intravenous Once  . LORazepam   1 mg Intravenous Once  . meropenem (MERREM) IV  500 mg Intravenous Q12H  . multivitamin with minerals  1 tablet Oral Daily  . mupirocin ointment  1 application Nasal BID  . neomycin-bacitracin-polymyxin   Topical Daily  . nutrition supplement  1 packet Oral BID BM  . oxymorphone  20 mg Oral Q12H  . pantoprazole  80 mg Oral Q1200  . senna  1 tablet Oral BID  . sodium chloride  10-40 mL Intracatheter Q12H  . DISCONTD: heparin subcutaneous  5,000 Units Subcutaneous Q8H  . DISCONTD: oxymorphone  40 mg Oral TID  . DISCONTD: pregabalin  25 mg Oral BID    Objective: Vital signs in last 24 hours: Temp:  [98.1 F (36.7 C)-98.3 F (36.8 C)] 98.2 F (36.8 C) (09/25 1300) Pulse Rate:  [105-122] 105  (09/25 1300) Resp:  [20] 20  (09/25 1300) BP: (145-153)/(88-92) 145/89 mmHg (09/25 1300) SpO2:  [100 %] 100 % (09/25 1300)   General appearance: alert, delirious, uncooperative and agitated, "please don't touch me"  Lab Results  Basename 05/07/12 0635 05/06/12 0700  WBC 16.0* 10.5  HGB 9.7* 8.5*  HCT 27.8* 24.9*  NA 143 141  K 4.1 4.9  CL 105 105  CO2 24 26  BUN 43* 37*  CREATININE 2.83* 2.54*  GLU -- --   Liver Panel  Basename 05/06/12 0700  PROT --  ALBUMIN 3.1*  AST --  ALT --  ALKPHOS --  BILITOT --  BILIDIR --  IBILI --   Sedimentation Rate No results found for this basename: ESRSEDRATE in the last 72 hours C-Reactive Protein No results found for this basename: CRP:2 in the last 72 hours  Microbiology: Recent Results (from the past 240 hour(s))  URINE CULTURE     Status: Normal   Collection Time   05/01/12  4:31 PM      Component Value Range Status Comment   Specimen Description URINE, CATHETERIZED   Final    Special Requests ADDED 05/01/12 1843   Final    Culture  Setup Time 05/01/2012 19:09   Final    Colony Count NO GROWTH   Final    Culture NO GROWTH   Final    Report Status 05/02/2012 FINAL   Final   CULTURE, BLOOD (ROUTINE X 2)     Status: Normal    Collection Time   05/01/12  6:45 PM      Component Value Range Status Comment   Specimen Description BLOOD LEFT HAND   Final    Special Requests BOTTLES DRAWN AEROBIC AND ANAEROBIC 10CC   Final    Culture  Setup Time 05/02/2012 01:02   Final    Culture NO GROWTH 5 DAYS   Final    Report Status 05/08/2012 FINAL   Final   CULTURE, BLOOD (ROUTINE X 2)     Status: Normal   Collection Time   05/01/12  7:24 PM      Component Value Range Status Comment   Specimen Description BLOOD RIGHT HAND   Final    Special Requests BOTTLES DRAWN AEROBIC AND ANAEROBIC 10CC   Final    Culture  Setup Time 05/02/2012 01:02   Final    Culture NO GROWTH 5 DAYS   Final    Report Status 05/08/2012 FINAL   Final   MRSA PCR SCREENING     Status: Abnormal   Collection Time   05/04/12  6:09 AM      Component Value Range Status Comment   MRSA by PCR POSITIVE (*) NEGATIVE Final     Studies/Results: Dg Chest Port 1 View  05/08/2012  *RADIOLOGY REPORT*  Clinical Data: Shortness of breath  PORTABLE CHEST - 1 VIEW  Comparison: 05/03/2012  Findings: The heart size and mediastinal contours are within normal limits.  Both lungs are clear.  The visualized skeletal structures are unremarkable.  IMPRESSION: Negative examination.   Original Report Authenticated By: Rosealee Albee, M.D.      Assessment/Plan: Delirium (has bipolar per family)  Acute osteomyelitis T9-10 with destruction of T10 bone  Sacral decubitus  Prev c-spine surgery,  Infection of previous surgical site  ARF  funguria  Total days of antibiotics 37  cubicin Day 6  merrem day 5  Would-  No change in anbx Awaiting psych eval Appreciate surgical f/u  [name] Infectious Diseases [phone] 05/08/2012, 3:53 PM   LOS: 7 days

## 2012-05-08 NOTE — Progress Notes (Addendum)
[facility]                                                                                Patient Demographics  Clarence Sims, is a 51 y.o. male  ZOX:[id number]  [id number]  DOB - [birth year]  Admit date - 05/01/2012  Admitting Physician Altha Harm, MD  Outpatient Primary MD for the patient is SCHULTZ,DOUGLAS E, MD  LOS - 7   No chief complaint on file.       Assessment & Plan     1. Osteomyelitis - chronic, pelvic region + acute T9-T10  -chronic osteomyelitis of the sacrum - patient did not complete MRI to look at the lumbar spine/sacrum secondary to claustrophobia but MRI of T spine confirmed T9-T10 involvement - ID following and directing abx - CT reveals near complete destruction of T10 vertebra - pt is non-ambulatory/bed bound at this time - will need to have brace if he progresses to the point of attempting upright posture again, have requested Dr. Lovell Sheehan neurosurgeon to review his MRI and give his opinion appears to be a poor candidate for any surgical intervention. Wound VAC has been removed on 05/08/2012 by general surgery, patient was seen by general surgery on 05/08/2012 discussed the case in detail with them, currently no surgical intervention from that standpoint. Continue wound care per wound care nurses.    Antibiotics. Cubicin started on 9-18 (but was on it at other facility too) , meropenam started 04-2012     2. Sacral decubitus ulcer, stage IV:    Wound VAC has been removed on 05/08/2012 by general surgery, patient was seen by general surgery on 05/08/2012 discussed the case in detail with them, currently no surgical intervention from that standpoint. Continue wound care per wound care nurses.    3. Bacteremia  -antibiotics per ID      4. Acute renal failure/worsening:  -multifactorial likely associated with low blood pressures, vancomycin toxicity and volume depletion  -Nephrology following , follow BMP.    5.  funguria  -Has completed a course of diflucan     6.Anemia:   -Hb stable today ,4 units PRBC transfused at OSH  Serum iron is critically low, so was given IV iron , also received RS  Monitor for signs of bleeding not sure etiology of thrombocytopenia and severe anemia- apparently HIT was negative at [facility]       7. Paranoia/Agitation/Delirium -paranoid, agitated at times-requiring restraints  -agitated requiring Ativan, for agitation and restraints. - reviewed with psych bedside, Qtc 503 ms, only option is Benzo, started on librium + PRN IV Ativan, Mag now, tele if allows     8. 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 Burn erythema:  small area on the right upper thigh where he accidentally burned the skin secondary to decreased sensation -  Antibiotic ointment recommended by wound ostomy care      12. Thrombocytopenia:  received one unit of platelets - counts cont to improve, count stable , HIT panel negative at previous hospital will monitor closely     Code Status: Full  Family Communication: called wife x 2 no answer  Disposition Plan: SNF    Procedures CT, W vac removal, IR requested for IJ PICC line placement on 05/08/2012 patient had pulled previous PICC line few days ago due to delirium.   Consults  Gen Surg, Renal, ID, N Surg called over phone Dr Lovell Sheehan   Time Spent in minutes   45   Antibiotics     Anti-infectives     Start     Dose/Rate Route Frequency Ordered Stop   05/03/12 1530   meropenem (MERREM) 500 mg in sodium chloride 0.9 % 50 mL IVPB        500 mg 100 mL/hr over 30 Minutes  Intravenous Every 12 hours 05/03/12 1438     05/02/12 1100   piperacillin-tazobactam (ZOSYN) IVPB 3.375 g  Status:  Discontinued        3.375 g 12.5 mL/hr over 240 Minutes Intravenous Every 8 hours 05/02/12 1039 05/03/12 1438   05/02/12 1100   fluconazole (DIFLUCAN) tablet 200 mg        200 mg Oral Daily 05/02/12 1044 05/04/12 0946   05/01/12 1800   DAPTOmycin (CUBICIN) 420 mg in sodium chloride 0.9 % IVPB        420 mg 216.8 mL/hr over 30 Minutes Intravenous Every 48 hours 05/01/12 1624            Scheduled Meds:   . aspirin EC  81 mg Oral Daily  . Chlorhexidine Gluconate Cloth  6 each Topical Q0600  . DAPTOmycin (CUBICIN)  IV  420 mg Intravenous Q48H  . darbepoetin (ARANESP) injection - NON-DIALYSIS  100 mcg Subcutaneous Q Sun-1800  . docusate sodium  100 mg Oral BID  . feeding supplement  237 mL Oral BID BM  . insulin aspart  0-20 Units Subcutaneous TID WC  . insulin aspart  0-5 Units Subcutaneous QHS  . LORazepam  1 mg Intravenous Once  . meropenem (MERREM) IV  500 mg Intravenous Q12H  . multivitamin with minerals  1 tablet Oral Daily  . mupirocin ointment  1 application Nasal BID  . neomycin-bacitracin-polymyxin   Topical Daily  . nutrition supplement  1 packet Oral BID BM  . oxymorphone  20 mg Oral Q12H  . pantoprazole  80 mg Oral Q1200  . senna  1 tablet Oral BID  . sodium chloride  10-40 mL Intracatheter Q12H  . DISCONTD: oxymorphone  40 mg Oral TID  . DISCONTD: pregabalin  25 mg Oral BID   Continuous Infusions:  PRN Meds:.acetaminophen, albuterol, bisacodyl, carisoprodol, diphenhydrAMINE, haloperidol lactate, LORazepam, morphine injection, ondansetron (ZOFRAN) IV, ondansetron, oxyCODONE, polyethylene glycol, sodium chloride, DISCONTD:  HYDROmorphone (DILAUDID) injection, DISCONTD: LORazepam, DISCONTD: LORazepam, DISCONTD: LORazepam, DISCONTD: oxyCODONE   DVT Prophylaxis    had severely low platelet counts and severe anemia upon admission -  SCDs       Susa Raring K M.D on 05/08/2012 at 10:54 AM  Between 7am to 7pm - Pager - [phone]  After 7pm go to [email or web address] - password [email or web address]  And look for the night coverage person covering for me after hours  [facility] Office  [phone]    Subjective:   Clarence Sims today has, No headache, No chest pain, No abdominal pain -  No Nausea, No new weakness tingling . numbness, No Cough - SOB. Patient appears to be a unreliable historian.  Objective:   Filed Vitals:   05/07/12 0641 05/07/12 1300 05/07/12 2100 05/08/12 0724  BP: 120/96 119/20 153/92 152/88  Pulse: 62 119 122 115  Temp: 98.4 F (36.9 C)  98.1 F (36.7 C) 98.3 F (36.8 C)  TempSrc:   Axillary Axillary  Resp: 18 20 20 20   Height:      Weight:      SpO2: 99%  100% 100%    Wt Readings from Last 3 Encounters:  05/01/12 68.992 kg (152 lb 1.6 oz)     Intake/Output Summary (Last 24 hours) at 05/08/12 1054 Last data filed at 05/08/12 0900  Gross per 24 hour  Intake  398.4 ml  Output   1250 ml  Net -851.6 ml    Exam Awake but not alert wearing mittens,, No new F.N deficits has baseline paraplegia, Normal affect [place].AT,PERRAL Supple Neck,No JVD, No cervical lymphadenopathy appriciated.  Symmetrical Chest wall movement, Good air movement bilaterally, CTAB RRR,No Gallops,Rubs or new Murmurs, No Parasternal Heave +ve B.Sounds, Abd Soft, Non tender, No organomegaly appriciated, No rebound - guarding or rigidity. No Cyanosis, Clubbing or edema, No new Rash or bruise, noted few tattoos      Data Review   Micro Results Recent Results (from the past 240 hour(s))  URINE CULTURE     Status: Normal   Collection Time   05/01/12  4:31 PM      Component Value Range Status Comment   Specimen Description URINE, CATHETERIZED   Final    Special Requests ADDED 05/01/12 1843   Final    Culture  Setup Time 05/01/2012 19:09   Final    Colony Count NO GROWTH   Final    Culture NO GROWTH   Final     Report Status 05/02/2012 FINAL   Final   CULTURE, BLOOD (ROUTINE X 2)     Status: Normal (Preliminary result)   Collection Time   05/01/12  6:45 PM      Component Value Range Status Comment   Specimen Description BLOOD LEFT HAND   Final    Special Requests BOTTLES DRAWN AEROBIC AND ANAEROBIC 10CC   Final    Culture  Setup Time 05/02/2012 01:02   Final    Culture     Final    Value:        BLOOD CULTURE RECEIVED NO GROWTH TO DATE CULTURE WILL BE HELD FOR 5 DAYS BEFORE ISSUING A FINAL NEGATIVE REPORT   Report Status PENDING   Incomplete   CULTURE, BLOOD (ROUTINE X 2)     Status: Normal (Preliminary result)   Collection Time   05/01/12  7:24 PM      Component Value Range Status Comment   Specimen Description BLOOD RIGHT HAND   Final    Special Requests BOTTLES DRAWN AEROBIC AND ANAEROBIC 10CC   Final    Culture  Setup Time 05/02/2012 01:02   Final    Culture     Final    Value:        BLOOD CULTURE RECEIVED NO GROWTH TO DATE CULTURE WILL BE HELD FOR 5 DAYS BEFORE ISSUING A FINAL NEGATIVE REPORT   Report Status PENDING   Incomplete   MRSA PCR SCREENING     Status: Abnormal   Collection Time   05/04/12  6:09 AM      Component Value Range Status Comment  MRSA by PCR POSITIVE (*) NEGATIVE Final     Radiology Reports Ct Abdomen Pelvis Wo Contrast  05/04/2012  *RADIOLOGY REPORT*  Clinical Data: Anemia, evaluate for retroperitoneal hemorrhage  CT ABDOMEN AND PELVIS WITHOUT CONTRAST  Technique:  Multidetector CT imaging of the abdomen and pelvis was performed following the standard protocol without intravenous contrast.  Comparison: [place] CT abdomen/pelvis dated 03/31/2012. Correlation with MRI thoracic spine dated 05/02/2012  Findings: Trace bilateral pleural effusions with associated dependent atelectasis.  Blood pole is hypodense relative to myocardium, corresponding to known anemia.  Unenhanced liver, pancreas, and adrenal glands are within normal limits.  Spleen is mildly enlarged, measuring  13.9 cm in craniocaudal dimension.  Gallbladder is notable for apparent wall thickening, although this is favored to be secondary to underdistension.  No intrahepatic or extrahepatic ductal dilatation.  5 mm nonobstructing left renal calculus (series 2/image 41).  Right kidney is unremarkable.  No hydronephrosis.  No evidence of bowel obstruction.  Normal appendix.  Atherosclerotic calcifications of the abdominal aorta and branch vessels.  IVC filter.  No abdominopelvic ascites.  No suspicious abdominopelvic lymphadenopathy.  No evidence of retroperitoneal hemorrhage.  Bladder is mildly thick-walled but underdistended.  Indwelling Foley catheter.  Prostate is unremarkable.  Again seen is a sacral decubitus ulcer with a suspected cortical irregularity involving the distal sacrum/coccyx (see sagittal image 58), likely reflecting osteomyelitis.  Associated presacral soft tissue stranding (series 2/image 73), new/increased.  Again seen is near complete destruction of the T10 vertebral body with associated destructive changes at T9 and T11, compatible with acute osteomyelitis as noted on recent MRI.  IMPRESSION: No evidence of retroperitoneal hemorrhage.  Mild splenomegaly.  Near complete destruction of the T10 vertebral body with known osteomyelitis.  Sacral decubitus ulcer with suspected sacrococcygeal osteomyelitis.   Original Report Authenticated By: Charline Bills, M.D.    Mr Thoracic Spine Wo Contrast  05/03/2012  *RADIOLOGY REPORT*  Clinical Data:  51 year old male with paraplegia, fevers.  Renal insufficiency precludes IV contrast.  Comparison: CT abdomen pelvis 03/31/2012.  MRI THORACIC SPINE WITHOUT CONTRAST  Technique: Multiplanar and multiecho pulse sequences of the thoracic spine were obtained without intravenous contrast.  Findings:  Limited sagittal imaging of the cervical spine suggests a bone marrow heterogeneity at the C3-C4 endplate.  This is of unclear significance and there is no definite  prevertebral or paraspinal inflammation at this level.  Because of motion artifact, it is difficult to confirm the cervical spine numbering utilized on these images.  The abnormal lower thoracic levels seen on the recent CT seems to be the T9-T10 level.  This does appear to be the only level of possible remote spinal injury, with bulky heterotopic ossification contributing to multifactorial spinal stenosis.  There is spinal cord mass effect and increased T2 and STIR signal abnormality here compatible with chronic cord injury and myelomalacia in the setting of chronic paraplegia. However, there is abnormal marrow edema at this level and increased T2 and STIR signal within the former disc space.  Mild associated paraspinal inflammation is noted bilaterally.  No definite epidural collection or dural thickening.  Other thoracic levels appear within normal limits.  The thoracic spinal cord elsewhere appears grossly normal.  The conus medullaris is better seen on the lumbar study below.   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  Stable other visualized thoracic and upper abdominal viscera.  IMPRESSION: 1.  The abnormal lower thoracic level as seen on the recent CT is designated T9-T10  and shows evidence of acute osteomyelitis as well as remote injury. 2.  No associated epidural abscess. 3.  Other thoracic levels appear within normal limits.  See lumbar findings below.  MRI LUMBAR SPINE WITHOUT CONTRAST  Technique: Multiplanar and multiecho pulse sequences of the lumbar spine were obtained without intravenous contrast.  Findings:  The patient refused axial lumbar imaging.  The numbering system in the cervical and thoracic spine results in transitional lumbosacral anatomy with a sacralized L5 level. Correlation with radiographs is recommended prior to any operative intervention.  No STIR imaging of the lumbar spine was obtained.  Lumbar marrow signal appears to be normal  except for degenerative endplate changes at L1 and L4-L5.  The conus medullaris appears normal at L1.  Cauda equina nerve roots appear grossly normal.  There is multifactorial mild spinal and mild to moderate foraminal stenosis at L3-L4.  No lumbar epidural collection is evident.  Visualized sacrum to the S3 level appears within normal limits.  Lumbar paraspinal soft tissues are grossly normal.  IMPRESSION: 1. The examination was discontinued prior to completion due to patient claustrophobia. 2.  Degenerative changes in the lumbar spine, but no definite lumbar spinal infection. 3.  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   Original Report Authenticated By: Harley Hallmark, M.D.    Mr Lumbar Spine Wo Contrast  05/03/2012  *RADIOLOGY REPORT*  Clinical Data:  51 year old male with paraplegia, fevers.  Renal insufficiency precludes IV contrast.  Comparison: CT abdomen pelvis 03/31/2012.  MRI THORACIC SPINE WITHOUT CONTRAST  Technique: Multiplanar and multiecho pulse sequences of the thoracic spine were obtained without intravenous contrast.  Findings:  Limited sagittal imaging of the cervical spine suggests a bone marrow heterogeneity at the C3-C4 endplate.  This is of unclear significance and there is no definite prevertebral or paraspinal inflammation at this level.  Because of motion artifact, it is difficult to confirm the cervical spine numbering utilized on these images.  The abnormal lower thoracic levels seen on the recent CT seems to be the T9-T10 level.  This does appear to be the only level of possible remote spinal injury, with bulky heterotopic ossification contributing to multifactorial spinal stenosis.  There is spinal cord mass effect and increased T2 and STIR signal abnormality here compatible with chronic cord injury and myelomalacia in the setting of chronic paraplegia. However, there is abnormal marrow edema at this level and increased T2 and STIR signal within the  former disc space.  Mild associated paraspinal inflammation is noted bilaterally.  No definite epidural collection or dural thickening.  Other thoracic levels appear within normal limits.  The thoracic spinal cord elsewhere appears grossly normal.  The conus medullaris is better seen on the lumbar study below.   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  Stable other visualized thoracic and upper abdominal viscera.  IMPRESSION: 1.  The abnormal lower thoracic level as seen on the recent CT is designated T9-T10 and shows evidence of acute osteomyelitis as well as remote injury. 2.  No associated epidural abscess. 3.  Other thoracic levels appear within normal limits.  See lumbar findings below.  MRI LUMBAR SPINE WITHOUT CONTRAST  Technique: Multiplanar and multiecho pulse sequences of the lumbar spine were obtained without intravenous contrast.  Findings:  The patient refused axial lumbar imaging.  The numbering system in the cervical and thoracic spine results in transitional lumbosacral anatomy with a sacralized L5 level. Correlation with radiographs is recommended prior to  any operative intervention.  No STIR imaging of the lumbar spine was obtained.  Lumbar marrow signal appears to be normal except for degenerative endplate changes at L1 and L4-L5.  The conus medullaris appears normal at L1.  Cauda equina nerve roots appear grossly normal.  There is multifactorial mild spinal and mild to moderate foraminal stenosis at L3-L4.  No lumbar epidural collection is evident.  Visualized sacrum to the S3 level appears within normal limits.  Lumbar paraspinal soft tissues are grossly normal.  IMPRESSION: 1. The examination was discontinued prior to completion due to patient claustrophobia. 2.  Degenerative changes in the lumbar spine, but no definite lumbar spinal infection. 3.  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   Original Report  Authenticated By: Harley Hallmark, M.D.    Dg Chest Port 1 View  05/08/2012  *RADIOLOGY REPORT*  Clinical Data: Shortness of breath  PORTABLE CHEST - 1 VIEW  Comparison: 05/03/2012  Findings: The heart size and mediastinal contours are within normal limits.  Both lungs are clear.  The visualized skeletal structures are unremarkable.  IMPRESSION: Negative examination.   Original Report Authenticated By: Rosealee Albee, M.D.    Dg Chest Port 1 View  05/03/2012  *RADIOLOGY REPORT*  Clinical Data: Hypoxemia.  PORTABLE CHEST - 1 VIEW  Comparison: Multiple priors, most recently 90 18-1013.  Findings: There is a right-sided internal jugular central venous catheter with tip terminating in the distal superior vena cava. Lung volumes are low. There is cephalization of the pulmonary vasculature and slight indistinctness of the interstitial markings suggestive of mild pulmonary edema.  No more confluent consolidative airspace disease.  No definite pleural effusions. Heart size appears borderline to mildly enlarged.  Mediastinal contours are slightly distorted by patient rotation to the right and lordotic positioning.  IMPRESSION: 1.  Borderline to mild cardiomegaly with interval development of what appears to represent mild interstitial pulmonary edema.   Original Report Authenticated By: Florencia Reasons, M.D.    Dg Chest Port 1 View  05/01/2012  *RADIOLOGY REPORT*  Clinical Data: Central line placement.  PORTABLE CHEST - 1 VIEW  Comparison: 04/27/2012.  Findings: Right central line tip distal superior vena cava level. No gross pneumothorax.  Central pulmonary vascular prominence.  Tortuous aorta.  Heart size within normal limits.  IMPRESSION: Right central line tip distal superior vena cava level.   Original Report Authenticated By: Fuller Canada, M.D.     CBC  Lab 05/07/12 0635 05/06/12 0700 05/05/12 0450 05/04/12 1800 05/04/12 0418 05/02/12 0500 05/01/12 1745  WBC 16.0* 10.5 9.9 7.3 8.2 -- --  HGB 9.7*  8.5* 8.2* 7.7* 6.6* -- --  HCT 27.8* 24.9* 24.0* 22.5* 19.6* -- --  PLT 105* 100* 87* 71* 54* -- --  MCV 81.3 80.8 82.5 81.8 82.0 -- --  MCH 28.4 27.6 28.2 28.0 27.6 -- --  MCHC 34.9 34.1 34.2 34.2 33.7 -- --  RDW 18.5* 18.5* 19.2* 19.3* 20.1* -- --  LYMPHSABS -- -- -- -- 1.2 2.2 1.9  MONOABS -- -- -- -- 0.7 1.2* 1.3*  EOSABS -- -- -- -- 0.3 0.3 0.2  BASOSABS -- -- -- -- 0.0 0.0 0.1  BANDABS -- -- -- -- -- -- --    Chemistries   Lab 05/07/12 0635 05/06/12 0700 05/05/12 0450 05/04/12 0418 05/03/12 0559  NA 143 141 140 138 142  K 4.1 4.9 4.9 4.7 4.3  CL 105 105 105 104 107  CO2 24 26 24 23  24  GLUCOSE 146* 124* 174* 370* 95  BUN 43* 37* 36* 40* 37*  CREATININE 2.83* 2.54* 2.53* 2.69* 2.69*  CALCIUM 10.4 9.9 9.5 8.6 8.9  MG -- -- -- -- --  AST -- -- -- -- --  ALT -- -- -- -- --  ALKPHOS -- -- -- -- --  BILITOT -- -- -- -- --   ------------------------------------------------------------------------------------------------------------------ estimated creatinine clearance is 27.6 ml/min (by C-G formula based on Cr of 2.83). ------------------------------------------------------------------------------------------------------------------ No results found for this basename: HGBA1C:2 in the last 72 hours ------------------------------------------------------------------------------------------------------------------ No results found for this basename: CHOL:2,HDL:2,LDLCALC:2,TRIG:2,CHOLHDL:2,LDLDIRECT:2 in the last 72 hours ------------------------------------------------------------------------------------------------------------------ No results found for this basename: TSH,T4TOTAL,FREET3,T3FREE,THYROIDAB in the last 72 hours ------------------------------------------------------------------------------------------------------------------ No results found for this basename: VITAMINB12:2,FOLATE:2,FERRITIN:2,TIBC:2,IRON:2,RETICCTPCT:2 in the last 72 hours  Coagulation profile No  results found for this basename: INR:5,PROTIME:5 in the last 168 hours  No results found for this basename: DDIMER:2 in the last 72 hours  Cardiac Enzymes No results found for this basename: CK:3,CKMB:3,TROPONINI:3,MYOGLOBIN:3 in the last 168 hours ------------------------------------------------------------------------------------------------------------------ No components found with this basename: POCBNP:3
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
## 2012-05-08 NOTE — Progress Notes (Signed)
PT Cancellation Note  Treatment cancelled today due to pt is very confused and aggitated from being in restraints in the bed.  He does not know where he is or why he is here.  PT will need two person assist to safely get him OOB.  Will attempt OOB to [facility] tomorrow if he is less aggitated and cooperative.  Lurena Joiner B. Maitland Lesiak, PT, DPT [phone]   05/08/2012, 3:04 PM

## 2012-05-08 NOTE — Progress Notes (Signed)
Pt seen and I agree with Ms. Osborne's assessment and plan

## 2012-05-08 NOTE — Progress Notes (Signed)
Patient ID: Clarence Sims, male   DOB: [birth year], 51 y.o.   MRN: [id number]    Subjective: Asked to see the patient again for his decubitus ulcer.  Patient is essentially unable to provide a history.  Paraplegia secondary to spinal infarct which caused wound and now significant spinal osteomyelitis.  He currently has a wound VAC in place.  Objective: Vital signs in last 24 hours: Temp:  [98.1 F (36.7 C)-98.3 F (36.8 C)] 98.3 F (36.8 C) (09/25 0724) Pulse Rate:  [115-122] 115  (09/25 0724) Resp:  [20] 20  (09/25 0724) BP: (119-153)/(20-92) 152/88 mmHg (09/25 0724) SpO2:  [100 %] 100 % (09/25 0724) Last BM Date: 05/07/12  Intake/Output from previous day: 09/24 0701 - 09/25 0700 In: 208.4 [IV Piggyback:208.4] Out: 551 [Urine:550; Stool:1] Intake/Output this shift: Total I/O In: -  Out: 700 [Urine:700]  PE: Skin: sacral decubitus ulcer is essentially clean, bone is palpable.  No foul or purulent drainage.  This is approximately 4x4x4 cm in size.  Lab Results:   Basename 05/07/12 0635 05/06/12 0700  WBC 16.0* 10.5  HGB 9.7* 8.5*  HCT 27.8* 24.9*  PLT 105* 100*   BMET  Basename 05/07/12 0635 05/06/12 0700  NA 143 141  K 4.1 4.9  CL 105 105  CO2 24 26  GLUCOSE 146* 124*  BUN 43* 37*  CREATININE 2.83* 2.54*  CALCIUM 10.4 9.9   PT/INR No results found for this basename: LABPROT:2,INR:2 in the last 72 hours CMP     Component Value Date/Time   NA 143 05/07/2012 0635   K 4.1 05/07/2012 0635   CL 105 05/07/2012 0635   CO2 24 05/07/2012 0635   GLUCOSE 146* 05/07/2012 0635   BUN 43* 05/07/2012 0635   CREATININE 2.83* 05/07/2012 0635   CALCIUM 10.4 05/07/2012 0635   ALBUMIN 3.1* 05/06/2012 0700   GFRNONAA 25* 05/07/2012 0635   GFRAA 29* 05/07/2012 0635   Lipase  No results found for this basename: lipase       Studies/Results: Dg Chest Port 1 View  05/08/2012  *RADIOLOGY REPORT*  Clinical Data: Shortness of breath  PORTABLE CHEST - 1 VIEW  Comparison: 05/03/2012   Findings: The heart size and mediastinal contours are within normal limits.  Both lungs are clear.  The visualized skeletal structures are unremarkable.  IMPRESSION: Negative examination.   Original Report Authenticated By: Rosealee Albee, M.D.     Anti-infectives: Anti-infectives     Start     Dose/Rate Route Frequency Ordered Stop   05/03/12 1530   meropenem (MERREM) 500 mg in sodium chloride 0.9 % 50 mL IVPB        500 mg 100 mL/hr over 30 Minutes Intravenous Every 12 hours 05/03/12 1438     05/02/12 1100   piperacillin-tazobactam (ZOSYN) IVPB 3.375 g  Status:  Discontinued        3.375 g 12.5 mL/hr over 240 Minutes Intravenous Every 8 hours 05/02/12 1039 05/03/12 1438   05/02/12 1100   fluconazole (DIFLUCAN) tablet 200 mg        200 mg Oral Daily 05/02/12 1044 05/04/12 0946   05/01/12 1800   DAPTOmycin (CUBICIN) 420 mg in sodium chloride 0.9 % IVPB        420 mg 216.8 mL/hr over 30 Minutes Intravenous Every 48 hours 05/01/12 1624             Assessment/Plan  1. Sacral decubitus ulcer with spinal osteomyelitis 2. Paraplegia  Plan: 1. No acute surgical indications  as wound is relatively clean. 2. A VAC is contraindicated in a patient with osteomyelitis.  This was removed and a NS WD dressing change was done. 3. Would recommend neuro or ortho consult to eval significant spinal osteo to make sure nothing needs to be done. 4. abx therapy per primary and ID. 5.  Will sign off.   LOS: 7 days    Anaka Beazer E 05/08/2012, 10:03 AM Pager: [phone]

## 2012-05-08 NOTE — Progress Notes (Addendum)
IV IRON per Pharmacy  CONSULT NOTE - Initial    Pharmacy Consult for  IV Iron Indication: Iron saturation ratio is 7 (low) and Iron = 13 (low) on 05/01/12.  No Known Allergies  Patient Measurements: Height: 5\' 3"  (160 cm) Weight: 152 lb 1.6 oz (68.992 kg) IBW/kg (Calculated) : 56.9    Vital Signs: Temp: 98.3 F (36.8 C) (09/25 0724) Temp src: Axillary (09/25 0724) BP: 152/88 mmHg (09/25 0724) Pulse Rate: 115  (09/25 0724) Intake/Output from previous day: 09/24 0701 - 09/25 0700 In: 208.4 [IV Piggyback:208.4] Out: 551 [Urine:550; Stool:1] Intake/Output from this shift: Total I/O In: 240 [P.O.:240] Out: 700 [Urine:700]  Labs:  [facility] 05/07/12 0635 05/06/12 0700  WBC 16.0* 10.5  HGB 9.7* 8.5*  PLT 105* 100*  LABCREA -- --  CREATININE 2.83* 2.54*   Estimated Creatinine Clearance: 27.6 ml/min (by C-G formula based on Cr of 2.83). No results found for this basename: VANCOTROUGH:2,VANCOPEAK:2,VANCORANDOM:2,GENTTROUGH:2,GENTPEAK:2,GENTRANDOM:2,TOBRATROUGH:2,TOBRAPEAK:2,TOBRARND:2,AMIKACINPEAK:2,AMIKACINTROU:2,AMIKACIN:2, in the last 72 hours   Microbiology: Recent Results (from the past 720 hour(s))  URINE CULTURE     Status: Normal   Collection Time   05/01/12  4:31 PM      Component Value Range Status Comment   Specimen Description URINE, CATHETERIZED   Final    Special Requests ADDED 05/01/12 1843   Final    Culture  Setup Time 05/01/2012 19:09   Final    Colony Count NO GROWTH   Final    Culture NO GROWTH   Final    Report Status 05/02/2012 FINAL   Final   CULTURE, BLOOD (ROUTINE X 2)     Status: Normal   Collection Time   05/01/12  6:45 PM      Component Value Range Status Comment   Specimen Description BLOOD LEFT HAND   Final    Special Requests BOTTLES DRAWN AEROBIC AND ANAEROBIC 10CC   Final    Culture  Setup Time 05/02/2012 01:02   Final    Culture NO GROWTH 5 DAYS   Final    Report Status 05/08/2012 FINAL   Final   CULTURE, BLOOD (ROUTINE X 2)     Status:  Normal   Collection Time   05/01/12  7:24 PM      Component Value Range Status Comment   Specimen Description BLOOD RIGHT HAND   Final    Special Requests BOTTLES DRAWN AEROBIC AND ANAEROBIC 10CC   Final    Culture  Setup Time 05/02/2012 01:02   Final    Culture NO GROWTH 5 DAYS   Final    Report Status 05/08/2012 FINAL   Final   MRSA PCR SCREENING     Status: Abnormal   Collection Time   05/04/12  6:09 AM      Component Value Range Status Comment   MRSA by PCR POSITIVE (*) NEGATIVE Final     Medical History: Past Medical History  Diagnosis Date  . Spinal cord infarction   . 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 GERD (gastroesophageal reflux disease)   . Chronic osteomyelitis   . Diabetes mellitus     "used to take Metformin; told me I didn't need it anymore" (05/01/2012)  . Hypertension     "history" (05/01/2012)  . 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 Pneumonia 11/13/2010  . History of blood transfusion 02/2012; 04/2012    "I've had 4 units last few days" (05/01/2012)  . H/O hiatal hernia 1980's  .  Chronic lower back pain   . Anxiety   . Prostate cancer   . Acute renal failure (ARF)     "that's why I'm jere" (05/01/2012)    Assessment: 51 y.o male Initial order for IV Iron placed on 9/20 but discontinued per our discussion with Dr. Ashley Royalty due to patient became febrile, O2 sats 88% on RA , tachycardic and tranferred to SDU.  To date this patient has not received IV Iron yet. I have spoken with Dr. Thedore Mins today and he has given me orders to give the IV iron today as patient's iron saturation ratio is 7 (low) and Iron = 13 (low) on 05/01/12.  Ferritin was high likely due to  inflammation (postive acute phase reactant).   Hbg today = 9.7 s/p 2 units PRBC 9/18 and 2 units PRBC 9/21.   Iron deficit is ~1 gm.  Goal of Therapy: Hgb 12-14 g/dl  Plan:  - Will give Iron dextran 25mg  test dose now, if well tolerated, will give 1000mg  IV Iron  dextran later today - Tomorrow will start oral ferrous sulfate supplementation- 325mg  PO TID with meals as discussed with Dr. Thedore Mins.    Thanks, Noah Delaine, RPh Clinical Pharmacist Pager: [phone] 05/08/2012 11:22 AM

## 2012-05-08 NOTE — Progress Notes (Signed)
Daily Renal Progress Note  Subjective:   Patient awake this morning; states he is feeling better. Psych CSW unable to do assessment yesterday due to mental status. Patient continues to have agitated behavior.   Objective:   BP 152/88  Pulse 115  Temp 98.3 F (36.8 C) (Axillary)  Resp 20  Ht 5\' 3"  (1.6 m)  Wt 152 lb 1.6 oz (68.992 kg)  BMI 26.94 kg/m2  SpO2 100%  Intake/Output Summary (Last 24 hours) at 05/08/12 0939 Last data filed at 05/08/12 0727  Gross per 24 hour  Intake  208.4 ml  Output   1250 ml  Net -1041.6 ml   Weight change:   Physical Exam: Gen: In bed, awake. Answers questions appropriately HEENT: AT, .  Chest: No respiratory distress Heart: Tachycardic.  Ext: PIV in right upper arm  Imaging: Dg Chest Port 1 View  05/08/2012  *RADIOLOGY REPORT*  Clinical Data: Shortness of breath  PORTABLE CHEST - 1 VIEW  Comparison: 05/03/2012  Findings: The heart size and mediastinal contours are within normal limits.  Both lungs are clear.  The visualized skeletal structures are unremarkable.  IMPRESSION: Negative examination.   Original Report Authenticated By: Rosealee Albee, M.D.    Labs: BMET  Lab 05/07/12 9604 05/06/12 0700 05/05/12 0450 05/04/12 0418 05/03/12 0559 05/02/12 0500 05/01/12 1745  NA 143 141 140 138 142 136 138  K 4.1 4.9 4.9 4.7 4.3 3.8 3.7  CL 105 105 105 104 107 102 103  CO2 24 26 24 23 24 22 23   GLUCOSE 146* 124* 174* 370* 95 114* 139*  BUN 43* 37* 36* 40* 37* 39* 39*  CREATININE 2.83* 2.54* 2.53* 2.69* 2.69* 2.65* 2.55*  ALB -- -- -- -- -- -- --  CALCIUM 10.4 9.9 9.5 8.6 8.9 8.8 9.1  PHOS -- 4.3 5.2* 4.9* 5.9* 4.9* 4.5   CBC  Lab 05/07/12 0635 05/06/12 0700 05/05/12 0450 05/04/12 1800 05/04/12 0418 05/02/12 0500 05/01/12 1745  WBC 16.0* 10.5 9.9 7.3 -- -- --  NEUTROABS -- -- -- -- 6.1 7.5 9.4*  HGB 9.7* 8.5* 8.2* 7.7* -- -- --  HCT 27.8* 24.9* 24.0* 22.5* -- -- --  MCV 81.3 80.8 82.5 81.8 -- -- --  PLT 105* 100* 87* 71* -- -- --     Medications:       . aspirin EC  81 mg Oral Daily  . Chlorhexidine Gluconate Cloth  6 each Topical Q0600  . DAPTOmycin (CUBICIN)  IV  420 mg Intravenous Q48H  . darbepoetin (ARANESP) injection - NON-DIALYSIS  100 mcg Subcutaneous Q Sun-1800  . docusate sodium  100 mg Oral BID  . feeding supplement  237 mL Oral BID BM  . insulin aspart  0-20 Units Subcutaneous TID WC  . insulin aspart  0-5 Units Subcutaneous QHS  . LORazepam  1 mg Intravenous Once  . meropenem (MERREM) IV  500 mg Intravenous Q12H  . multivitamin with minerals  1 tablet Oral Daily  . mupirocin ointment  1 application Nasal BID  . neomycin-bacitracin-polymyxin   Topical Daily  . nutrition supplement  1 packet Oral BID BM  . oxymorphone  40 mg Oral TID  . pantoprazole  80 mg Oral Q1200  . pregabalin  25 mg Oral BID  . senna  1 tablet Oral BID  . sodium chloride  10-40 mL Intracatheter Q12H    Assessment/ Plan:   Pt is a 51 y.o. yo male with a PMHx of Depression, anxiety, HTN, GERD, PNA,  Osteo, Prostate cancer, neurogenic bladder and UTI, who was [facility] on [facility] for evaluation of worsening renal function after being treated for fever/sepsis, osteomyelitis, sacral wound, paraplegia, and chronic pain at [facility].   ARF- Patient has normal renal function at baseline with a Creatinine of 0.9 in early August 2013. Differential diagnoses for ARF include Vancomycin toxicity after extended use, volume depletion, UTI and sepsis.  Electrolytes were relatively unremarkable at time of transfer as well. Currently no signs of uremia.  - Labs at transfer to [facility] showed Cr of 2.55 (which has been a gradual increase over the last week or so.)  - Lyrica and MS contin d/c'd 9/21 secondary to sedation. Now receiving Opana and Ativan, which are also sedating. - Strict I/O; PO ad lib  - Current foley has been in place since Sept 10. (Does have chronic foley from neurogenic bladder) - Repeat labs in the AM,  hopefully we will be able to draw these - Today, creatinine still pending which is relatively stable. Patient's UOP recorded as 550cc/24hours, but I do not feel like that is accurate - Continue to monitor daily renal panel - Phos elevated at 5.2, consider starting Phoslo - We will continue to follow along, but no indication for intervention at this time.  Anemia- Patient reports 4 units PRBC transfused at OSH (2 units reported) and remains anemic. Transfused 2 units on 05/01/12. HgB 9/21 6.6, again s/p another 2 units of blood. His platelet count also low at admission with count of 17. Received platelets as well. Currently HgB stable at 9.7 and plt 105 - Iron panel shows low iron stores. Given IV iron x1 - Monitor for signs of bleeding not sure etiology of thrombocytopenia and severe anemia- apparently HIT was negative at [facility].   - Per primary team, transfuse as needed. Aranesp added as well.   Infection- Osteomyelitis, bacteremia and sacral ulcer. No longer on Vanc, transitioned to Daptomycin then transferred to stepdown on 9/20 for concern for sepsis - Ortho consult for sacral ulcer and osteomyelitis - Continue Dapto and Merrem, per ID - Treated yeast in urine with Diflucan x3 days; recheck Urinalysis looks better after treatment  DVT PPX - SCD   Dispo- No labs from this morning. Will attempt to get at least renal panel to trend creatinine. If unable to get those, will follow up AM labs from tomorrow. No intervention at this time.  Amber M. Hairford, MD 05/08/2012, 9:39 AM I have seen and examined this patient and agree with the plan of care  Seen and eval.  Limited information to base eval on .  No obvious clinical issues. If not changed in am will S/O .  Sander Speckman L 05/08/2012, 10:32 AM

## 2012-05-08 NOTE — Progress Notes (Signed)
Patient remains confused per nursing notes and assessment, however calmer (note yelling out) Patient wife has still not returned call back and per nursing she will be up here on Thursday after her 2 days of a mental health break. Patient remains in restraints and hand mitts for safety.  Discussed case with CM and CSW Lupita Leash on 5N unit.  Patient appears to be too much to handle for patient wife, thus assuming placement, but this cannot be determined until wife calls back or will meet in hospital. Patient remains confused with poor insight and judgement, thus cannot be a good Management consultant.  Patient requires a lot of care and will need additional wound care and home vs SNF.  Again, still working to reach wife, left messages for additional assistance for home situation, disposition, psych history.  Will follow up once wife returns call.  Ashley Jacobs, MSW LCSW [phone]

## 2012-05-09 ENCOUNTER — Inpatient Hospital Stay (HOSPITAL_COMMUNITY): Payer: Medicaid Other

## 2012-05-09 LAB — CBC
HCT: 23.1 % — ABNORMAL LOW (ref 39.0–52.0)
Hemoglobin: 7.6 g/dL — ABNORMAL LOW (ref 13.0–17.0)
MCH: 27.6 pg (ref 26.0–34.0)
MCHC: 32.9 g/dL (ref 30.0–36.0)
MCV: 84 fL (ref 78.0–100.0)
Platelets: 67 10*3/uL — ABNORMAL LOW (ref 150–400)
RBC: 2.75 MIL/uL — ABNORMAL LOW (ref 4.22–5.81)
RDW: 19.5 % — ABNORMAL HIGH (ref 11.5–15.5)
WBC: 12.7 10*3/uL — ABNORMAL HIGH (ref 4.0–10.5)

## 2012-05-09 LAB — COMPREHENSIVE METABOLIC PANEL
ALT: 13 U/L (ref 0–53)
AST: 20 U/L (ref 0–37)
Albumin: 3.5 g/dL (ref 3.5–5.2)
Alkaline Phosphatase: 96 U/L (ref 39–117)
BUN: 47 mg/dL — ABNORMAL HIGH (ref 6–23)
CO2: 23 mEq/L (ref 19–32)
Calcium: 9.8 mg/dL (ref 8.4–10.5)
Chloride: 103 mEq/L (ref 96–112)
Creatinine, Ser: 2.41 mg/dL — ABNORMAL HIGH (ref 0.50–1.35)
GFR calc Af Amer: 35 mL/min — ABNORMAL LOW (ref >90)
GFR calc non Af Amer: 30 mL/min — ABNORMAL LOW (ref >90)
Glucose, Bld: 113 mg/dL — ABNORMAL HIGH (ref 70–99)
Potassium: 3.7 mEq/L (ref 3.5–5.1)
Sodium: 140 mEq/L (ref 135–145)
Total Bilirubin: 0.8 mg/dL (ref 0.3–1.2)
Total Protein: 7.3 g/dL (ref 6.0–8.3)

## 2012-05-09 LAB — GLUCOSE, CAPILLARY
Glucose-Capillary: 154 mg/dL — ABNORMAL HIGH (ref 70–99)
Glucose-Capillary: 109 mg/dL — ABNORMAL HIGH (ref 70–99)
Glucose-Capillary: 147 mg/dL — ABNORMAL HIGH (ref 70–99)
Glucose-Capillary: 124 mg/dL — ABNORMAL HIGH (ref 70–99)

## 2012-05-09 LAB — MAGNESIUM: Magnesium: 2.4 mg/dL (ref 1.5–2.5)

## 2012-05-09 LAB — CK: Total CK: 163 U/L (ref 7–232)

## 2012-05-09 MED ORDER — CHLORDIAZEPOXIDE HCL 5 MG PO CAPS
15.0000 mg | ORAL_CAPSULE | Freq: Three times a day (TID) | ORAL | Status: DC
  Administered 2012-05-09 – 2012-05-10 (×4): 15 mg via ORAL
  Filled 2012-05-09 (×2): qty 3
  Filled 2012-05-09: qty 2
  Filled 2012-05-09: qty 1
  Filled 2012-05-09: qty 3

## 2012-05-09 MED ORDER — LORAZEPAM 2 MG/ML IJ SOLN
2.0000 mg | INTRAMUSCULAR | Status: DC | PRN
  Administered 2012-05-09: 2 mg via INTRAMUSCULAR
  Filled 2012-05-09: qty 1

## 2012-05-09 MED ORDER — LORAZEPAM 2 MG/ML IJ SOLN
2.0000 mg | Freq: Four times a day (QID) | INTRAMUSCULAR | Status: DC | PRN
  Administered 2012-05-09 – 2012-05-10 (×3): 2 mg via INTRAVENOUS
  Filled 2012-05-09 (×3): qty 1

## 2012-05-09 MED ORDER — LORAZEPAM 1 MG PO TABS
2.0000 mg | ORAL_TABLET | ORAL | Status: DC | PRN
  Administered 2012-05-09: 2 mg via ORAL
  Filled 2012-05-09: qty 2

## 2012-05-09 MED ORDER — FENTANYL CITRATE 0.05 MG/ML IJ SOLN
INTRAMUSCULAR | Status: AC | PRN
  Administered 2012-05-09: 25 ug via INTRAVENOUS

## 2012-05-09 MED ORDER — MAGNESIUM SULFATE IN D5W 10-5 MG/ML-% IV SOLN
1.0000 g | Freq: Once | INTRAVENOUS | Status: AC
  Administered 2012-05-09: 1 g via INTRAVENOUS
  Filled 2012-05-09: qty 100

## 2012-05-09 MED ORDER — MIDAZOLAM HCL 5 MG/5ML IJ SOLN
INTRAMUSCULAR | Status: AC | PRN
  Administered 2012-05-09: 2 mg via INTRAVENOUS

## 2012-05-09 MED ORDER — MAGNESIUM SULFATE 50 % IJ SOLN: 1.0000 g | Freq: Once | INTRAMUSCULAR | Status: DC

## 2012-05-09 NOTE — Progress Notes (Signed)
Pt on tele with sinus tachy. Pt keeps pulling tele box  and wires. Dr Craige Cotta notified, sitter ordered for safety. Will continue to monitor. Ativan 2mg  also given at this time

## 2012-05-09 NOTE — Progress Notes (Signed)
INFECTIOUS DISEASE PROGRESS NOTE  ID: Clarence Sims is a 51 y.o. male with  Active Problems:  Bacteremia associated with intravascular line  Paraplegia following spinal cord injury  Neurogenic bladder  Neurogenic bowel  Sacral decubitus ulcer, stage IV  Osteomyelitis, chronic, pelvic region  Acute renal failure  Anemia  Thrombocytopenia  Fever  GERD (gastroesophageal reflux disease)  Chronic indwelling foley catheter  Leukocytosis  Burn erythema  Sepsis  Sepsis associated hypotension  Osteomyelitis of spine  Subjective: confused  Abtx:  Anti-infectives     Start     Dose/Rate Route Frequency Ordered Stop   05/03/12 1530   meropenem (MERREM) 500 mg in sodium chloride 0.9 % 50 mL IVPB        500 mg 100 mL/hr over 30 Minutes Intravenous Every 12 hours 05/03/12 1438     05/02/12 1100   piperacillin-tazobactam (ZOSYN) IVPB 3.375 g  Status:  Discontinued        3.375 g 12.5 mL/hr over 240 Minutes Intravenous Every 8 hours 05/02/12 1039 05/03/12 1438   05/02/12 1100   fluconazole (DIFLUCAN) tablet 200 mg        200 mg Oral Daily 05/02/12 1044 05/04/12 0946   05/01/12 1800   DAPTOmycin (CUBICIN) 420 mg in sodium chloride 0.9 % IVPB        420 mg 216.8 mL/hr over 30 Minutes Intravenous Every 48 hours 05/01/12 1624            Medications:  Scheduled:   . aspirin EC  81 mg Oral Daily  . chlordiazePOXIDE  15 mg Oral TID  . DAPTOmycin (CUBICIN)  IV  420 mg Intravenous Q48H  . darbepoetin (ARANESP) injection - NON-DIALYSIS  100 mcg Subcutaneous Q Sun-1800  . docusate sodium  100 mg Oral BID  . feeding supplement  237 mL Oral BID BM  . insulin aspart  0-20 Units Subcutaneous TID WC  . insulin aspart  0-5 Units Subcutaneous QHS  . iron dextran (INFED/DEXFERRUM) infusion  25 mg Intravenous Once   Followed by  . iron dextran (INFED/DEXFERRUM) infusion  1,000 mg Intravenous Once  . LORazepam  2 mg Intramuscular Once  . magnesium sulfate 1 - 4 g bolus IVPB  1 g  Intravenous Once  . meropenem (MERREM) IV  500 mg Intravenous Q12H  . multivitamin with minerals  1 tablet Oral Daily  . mupirocin ointment  1 application Nasal BID  . neomycin-bacitracin-polymyxin   Topical Daily  . nutrition supplement  1 packet Oral BID BM  . oxymorphone  20 mg Oral Q12H  . pantoprazole  80 mg Oral Q1200  . senna  1 tablet Oral BID  . sodium chloride  10-40 mL Intracatheter Q12H  . DISCONTD: chlordiazePOXIDE  10 mg Oral TID  . DISCONTD: ferrous sulfate  325 mg Oral TID WC  . DISCONTD: magnesium oxide  800 mg Oral BID  . DISCONTD: magnesium sulfate  1 g Intravenous Once  . DISCONTD: magnesium sulfate 1 - 4 g bolus IVPB  1 g Intravenous Once  . DISCONTD: OLANZapine  15 mg Oral QHS    Objective: Vital signs in last 24 hours: Temp:  [98 F (36.7 C)-98.4 F (36.9 C)] 98 F (36.7 C) (09/26 0500) Pulse Rate:  [98-109] 109  (09/26 1032) Resp:  [16-18] 16  (09/26 1032) BP: (124-140)/(78-100) 125/82 mmHg (09/26 1032) SpO2:  [99 %-100 %] 99 % (09/26 1032)   General appearance: alert, cooperative, delirious and no distress Resp: clear to auscultation  bilaterally Chest wall: L tlc Cardio: regular rate and rhythm GI: normal findings: bowel sounds normal and soft, non-tender  Lab Results  Basename 05/09/12 1140 05/07/12 0635  WBC 12.7* 16.0*  HGB 7.6* 9.7*  HCT 23.1* 27.8*  NA 140 143  K 3.7 4.1  CL 103 105  CO2 23 24  BUN 47* 43*  CREATININE 2.41* 2.83*  GLU -- --   Liver Panel  Basename 05/09/12 1140  PROT 7.3  ALBUMIN 3.5  AST 20  ALT 13  ALKPHOS 96  BILITOT 0.8  BILIDIR --  IBILI --   Sedimentation Rate No results found for this basename: ESRSEDRATE in the last 72 hours C-Reactive Protein No results found for this basename: CRP:2 in the last 72 hours  Microbiology: Recent Results (from the past 240 hour(s))  URINE CULTURE     Status: Normal   Collection Time   05/01/12  4:31 PM      Component Value Range Status Comment   Specimen  Description URINE, CATHETERIZED   Final    Special Requests ADDED 05/01/12 1843   Final    Culture  Setup Time 05/01/2012 19:09   Final    Colony Count NO GROWTH   Final    Culture NO GROWTH   Final    Report Status 05/02/2012 FINAL   Final   CULTURE, BLOOD (ROUTINE X 2)     Status: Normal   Collection Time   05/01/12  6:45 PM      Component Value Range Status Comment   Specimen Description BLOOD LEFT HAND   Final    Special Requests BOTTLES DRAWN AEROBIC AND ANAEROBIC 10CC   Final    Culture  Setup Time 05/02/2012 01:02   Final    Culture NO GROWTH 5 DAYS   Final    Report Status 05/08/2012 FINAL   Final   CULTURE, BLOOD (ROUTINE X 2)     Status: Normal   Collection Time   05/01/12  7:24 PM      Component Value Range Status Comment   Specimen Description BLOOD RIGHT HAND   Final    Special Requests BOTTLES DRAWN AEROBIC AND ANAEROBIC 10CC   Final    Culture  Setup Time 05/02/2012 01:02   Final    Culture NO GROWTH 5 DAYS   Final    Report Status 05/08/2012 FINAL   Final   MRSA PCR SCREENING     Status: Abnormal   Collection Time   05/04/12  6:09 AM      Component Value Range Status Comment   MRSA by PCR POSITIVE (*) NEGATIVE Final     Studies/Results: Dg Chest Port 1 View  05/08/2012  *RADIOLOGY REPORT*  Clinical Data: Shortness of breath  PORTABLE CHEST - 1 VIEW  Comparison: 05/03/2012  Findings: The heart size and mediastinal contours are within normal limits.  Both lungs are clear.  The visualized skeletal structures are unremarkable.  IMPRESSION: Negative examination.   Original Report Authenticated By: Clarence Sims, M.D.      Assessment/Plan: Delirium (has bipolar per family)  Acute osteomyelitis T9-10 with destruction of T10 bone  Sacral decubitus  Prev c-spine surgery,  Infection of previous surgical site  ARF  funguria  Total days of antibiotics 38  cubicin Day 7  merrem day 6 Would-  No change in anbx. Plan for 42 days of anbx dated from admission (end date  Oct 25).  Not sure he can go "home", would need placement avaialble  if questions  Clarence Sims Infectious Diseases [phone] 05/09/2012, 2:29 PM   LOS: 8 days

## 2012-05-09 NOTE — Progress Notes (Signed)
Attempts to reach pt's wife Clarence Sims have been unsuccessful for the past 2 days. CSW contacted [employer] in [place], [place] and a request was made to go to pt's home and attempt to locate wife. She returned a call to the hospital stating that she would be at the hospital tomorrow morning and will meet with CSW and RNCM at that time.  Patient is demanding to return home; he remains in wrist restraints at this time.  Discussed this a.m.  With Dr. Thedore Mins who also spoke with Dr. Baron Sane- Psychiatrist.  Dr. Baron Sane stated that at this time- patient does not have capacity to make decisions regarding his care.  Possible d/c home tomorrow if wife agrees; if not- SNF placement attempts will proceed.  Lorri Frederick. [place]  [phone]

## 2012-05-09 NOTE — Progress Notes (Signed)
PT Cancellation Note  Attempted to follow up with patient; treatment cancelled today due to patient's combative and disoriented, not appropriate for PT at this time.    Fabio Asa 05/09/2012, 11:29 AM Charlotte Crumb, PT DPT  [phone]

## 2012-05-09 NOTE — Progress Notes (Signed)
Found in pt's bedside table, assorted pills, spoon, lighter and flushes.  Taken to be stored in main pharmacy.  Pills identified by pharmacist and listed on sheet in front of chart.   Spoke to pt's wife re meeting with Child psychotherapist and she states will be here tomorrow after 0900.   Doyle Askew, RN
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
## 2012-05-09 NOTE — Progress Notes (Addendum)
Daily Renal Progress Note  Subjective:   Patient awake this morning, no obvious agitation. Planning to go for tunneled IJ in IR today. Patient requesting to speak to his wife. No acute events overnight.   Objective:   BP 135/78  Pulse 98  Temp 98 F (36.7 C) (Axillary)  Resp 18  Ht 5\' 3"  (1.6 m)  Wt 152 lb 1.6 oz (68.992 kg)  BMI 26.94 kg/m2  SpO2 100%  Intake/Output Summary (Last 24 hours) at 05/09/12 0807 Last data filed at 05/08/12 1300  Gross per 24 hour  Intake    240 ml  Output      1 ml  Net    239 ml   Weight change:   Physical Exam: Gen: In bed, awake. Answers questions appropriately HEENT: AT, [place].  Chest: No respiratory distress Heart: RRR Abd: Soft, nontender Ext: PIV in right upper arm. Upper extremities restrained. Does not move lower extremities.   Imaging: Dg Chest Port 1 View  05/08/2012  *RADIOLOGY REPORT*  Clinical Data: Shortness of breath  PORTABLE CHEST - 1 VIEW  Comparison: 05/03/2012  Findings: The heart size and mediastinal contours are within normal limits.  Both lungs are clear.  The visualized skeletal structures are unremarkable.  IMPRESSION: Negative examination.   Original Report Authenticated By: Rosealee Albee, M.D.    Labs: BMET  Lab 05/07/12 1610 05/06/12 0700 05/05/12 0450 05/04/12 0418 05/03/12 0559  NA 143 141 140 138 142  K 4.1 4.9 4.9 4.7 4.3  CL 105 105 105 104 107  CO2 24 26 24 23 24   GLUCOSE 146* 124* 174* 370* 95  BUN 43* 37* 36* 40* 37*  CREATININE 2.83* 2.54* 2.53* 2.69* 2.69*  ALB -- -- -- -- --  CALCIUM 10.4 9.9 9.5 8.6 8.9  PHOS -- 4.3 5.2* 4.9* 5.9*   CBC  Lab 05/07/12 0635 05/06/12 0700 05/05/12 0450 05/04/12 1800 05/04/12 0418  WBC 16.0* 10.5 9.9 7.3 --  NEUTROABS -- -- -- -- 6.1  HGB 9.7* 8.5* 8.2* 7.7* --  HCT 27.8* 24.9* 24.0* 22.5* --  MCV 81.3 80.8 82.5 81.8 --  PLT 105* 100* 87* 71* --    Medications:       . aspirin EC  81 mg Oral Daily  . chlordiazePOXIDE  10 mg Oral TID  . DAPTOmycin  (CUBICIN)  IV  420 mg Intravenous Q48H  . darbepoetin (ARANESP) injection - NON-DIALYSIS  100 mcg Subcutaneous Q Sun-1800  . docusate sodium  100 mg Oral BID  . feeding supplement  237 mL Oral BID BM  . ferrous sulfate  325 mg Oral TID WC  . insulin aspart  0-20 Units Subcutaneous TID WC  . insulin aspart  0-5 Units Subcutaneous QHS  . iron dextran (INFED/DEXFERRUM) infusion  25 mg Intravenous Once   Followed by  . iron dextran (INFED/DEXFERRUM) infusion  1,000 mg Intravenous Once  . LORazepam  2 mg Intramuscular Once  . magnesium oxide  800 mg Oral BID  . magnesium sulfate 1 - 4 g bolus IVPB  1 g Intravenous Once  . meropenem (MERREM) IV  500 mg Intravenous Q12H  . multivitamin with minerals  1 tablet Oral Daily  . mupirocin ointment  1 application Nasal BID  . neomycin-bacitracin-polymyxin   Topical Daily  . nutrition supplement  1 packet Oral BID BM  . oxymorphone  20 mg Oral Q12H  . pantoprazole  80 mg Oral Q1200  . senna  1 tablet Oral BID  .  sodium chloride  10-40 mL Intracatheter Q12H  . DISCONTD: heparin subcutaneous  5,000 Units Subcutaneous Q8H  . DISCONTD: OLANZapine  15 mg Oral QHS  . DISCONTD: oxymorphone  40 mg Oral TID  . DISCONTD: pregabalin  25 mg Oral BID    Assessment/ Plan:   Pt is a 51 y.o. yo male with a PMHx of Depression, anxiety, HTN, GERD, PNA, Osteo, Prostate cancer, neurogenic bladder and UTI, who was transferred to [facility] on 05/01/2012 for evaluation of worsening renal function after being treated for fever/sepsis, osteomyelitis, sacral wound, paraplegia, and chronic pain at [facility].   ARF- Patient has normal renal function at baseline with a Creatinine of 0.9 in early August 2013. Differential diagnoses for ARF include Vancomycin toxicity after extended use, volume depletion, UTI and sepsis.  Electrolytes were relatively unremarkable at time of transfer as well. Currently no signs of uremia.  - Labs at transfer to [facility] showed Cr of 2.55  (which has been a gradual increase over the last week or so.)  - Lyrica and MS contin d/c'd 9/21 secondary to sedation. Now receiving Opana and Ativan, which are also sedating. - Strict I/O; PO ad lib  - Current foley has been in place since Sept 10. (Does have chronic foley from neurogenic bladder) - Repeat labs in the AM, hopefully we will be able to draw these - Today, creatinine still pending which is relatively stable. Patient's UOP recorded as 700cc/24 hr, but I do not feel like that is accurate - Continue to monitor daily renal panel - Phos elevated at 5.2, consider starting Phoslo - No indication for intervention at this time.  Anemia- Patient reports 4 units PRBC transfused at OSH (2 units reported) and remains anemic. Transfused 2 units on 05/01/12. HgB 9/21 6.6, again s/p another 2 units of blood. His platelet count also low at admission with count of 17. Received platelets as well. Currently HgB stable at 9.7 and plt 105 - Iron panel shows low iron stores. Given IV iron x1 last week, but pharmacy gave additional dose yesterday and started on PO iron.  - Monitor for signs of bleeding not sure etiology of thrombocytopenia and severe anemia- apparently HIT was negative at [place].   - Per primary team, transfuse as needed. Aranesp added as well.   Infection- Osteomyelitis, bacteremia and sacral ulcer. No longer on Vanc, transitioned to Daptomycin then transferred to stepdown on 9/20 for concern for sepsis - Ortho consult for sacral ulcer and osteomyelitis - Continue Dapto and Merrem, per ID - Treated yeast in urine with Diflucan x3 days; Urinalysis looks better after treatment  DVT PPX - SCD   Dispo- No labs from this morning due to unable to get sample. We will sign off at this time.   Amber M. Hairford, MD 05/09/2012, 8:07 AM   I examined Mr. Reichel in his room.  He has tunelled IV cath in R IJ.  He is restrained, agitated, and angry that "No on will call my wife!" He remains  on daptomycin and meropenem. I<O last 72 hr.  He does not appear vol overloaded.  Cr today is 2.41, K 3.7, Mg 2.4.  He received IV Fe and is now on aranesp 100/wk.  I'm hopeful renal fct will improve over next several days-weeks.

## 2012-05-09 NOTE — Procedures (Signed)
Placement of right IJ tunneled central venous catheter.  Tip in SVC and ready to use.  No immediate complication.

## 2012-05-09 NOTE — Progress Notes (Signed)
PT Cancellation Note  Treatment cancelled today due to patient receiving procedure or test; having a IJ Picc line placed.  Fabio Asa 05/09/2012, 10:02 AM Charlotte Crumb, PT DPT  [phone]

## 2012-05-10 ENCOUNTER — Telehealth (HOSPITAL_COMMUNITY): Payer: Self-pay | Admitting: *Deleted

## 2012-05-10 LAB — GLUCOSE, CAPILLARY
Glucose-Capillary: 102 mg/dL — ABNORMAL HIGH (ref 70–99)
Glucose-Capillary: 120 mg/dL — ABNORMAL HIGH (ref 70–99)

## 2012-05-10 LAB — COMPREHENSIVE METABOLIC PANEL
ALT: 15 U/L (ref 0–53)
AST: 20 U/L (ref 0–37)
Albumin: 3.2 g/dL — ABNORMAL LOW (ref 3.5–5.2)
Alkaline Phosphatase: 92 U/L (ref 39–117)
BUN: 40 mg/dL — ABNORMAL HIGH (ref 6–23)
CO2: 23 mEq/L (ref 19–32)
Calcium: 9.6 mg/dL (ref 8.4–10.5)
Chloride: 106 mEq/L (ref 96–112)
Creatinine, Ser: 2.25 mg/dL — ABNORMAL HIGH (ref 0.50–1.35)
GFR calc Af Amer: 38 mL/min — ABNORMAL LOW (ref >90)
GFR calc non Af Amer: 32 mL/min — ABNORMAL LOW (ref >90)
Glucose, Bld: 105 mg/dL — ABNORMAL HIGH (ref 70–99)
Potassium: 3.5 mEq/L (ref 3.5–5.1)
Sodium: 141 mEq/L (ref 135–145)
Total Bilirubin: 0.6 mg/dL (ref 0.3–1.2)
Total Protein: 6.7 g/dL (ref 6.0–8.3)

## 2012-05-10 LAB — CBC
HCT: 21.1 % — ABNORMAL LOW (ref 39.0–52.0)
Hemoglobin: 7.1 g/dL — ABNORMAL LOW (ref 13.0–17.0)
MCH: 28.6 pg (ref 26.0–34.0)
MCHC: 33.6 g/dL (ref 30.0–36.0)
MCV: 85.1 fL (ref 78.0–100.0)
Platelets: 90 10*3/uL — ABNORMAL LOW (ref 150–400)
RBC: 2.48 MIL/uL — ABNORMAL LOW (ref 4.22–5.81)
RDW: 19.5 % — ABNORMAL HIGH (ref 11.5–15.5)
WBC: 10.4 10*3/uL (ref 4.0–10.5)

## 2012-05-10 LAB — MAGNESIUM: Magnesium: 2.5 mg/dL (ref 1.5–2.5)

## 2012-05-10 MED ORDER — PREGABALIN 100 MG PO CAPS: 100.0000 mg | ORAL_CAPSULE | Freq: Three times a day (TID) | ORAL | Status: AC

## 2012-05-10 MED ORDER — HEPARIN SOD (PORK) LOCK FLUSH 100 UNIT/ML IV SOLN
250.0000 [IU] | INTRAVENOUS | Status: DC | PRN
  Administered 2012-05-10 (×2): 250 [IU]
  Filled 2012-05-10: qty 3

## 2012-05-10 MED ORDER — ENSURE PLUS PO LIQD: 237.0000 mL | Freq: Two times a day (BID) | ORAL | Status: AC

## 2012-05-10 MED ORDER — DOCUSATE SODIUM 100 MG PO CAPS: 100.0000 mg | ORAL_CAPSULE | Freq: Every day | ORAL | Status: AC

## 2012-05-10 MED ORDER — HEPARIN SOD (PORK) LOCK FLUSH 100 UNIT/ML IV SOLN
250.0000 [IU] | Freq: Every day | INTRAVENOUS | Status: DC
  Filled 2012-05-10: qty 3

## 2012-05-10 MED ORDER — PANTOPRAZOLE SODIUM 40 MG PO TBEC: 40.0000 mg | DELAYED_RELEASE_TABLET | Freq: Every day | ORAL | Status: AC

## 2012-05-10 MED ORDER — OXYCODONE HCL 5 MG PO TABS: 10.0000 mg | ORAL_TABLET | ORAL | Status: DC | PRN

## 2012-05-10 MED ORDER — SODIUM CHLORIDE 0.9 % IV SOLN: INTRAVENOUS | Status: DC

## 2012-05-10 MED ORDER — FERROUS SULFATE 325 (65 FE) MG PO TABS: 325.0000 mg | ORAL_TABLET | Freq: Every day | ORAL | Status: AC

## 2012-05-10 MED ORDER — LORAZEPAM 0.5 MG PO TABS: 0.5000 mg | ORAL_TABLET | Freq: Every day | ORAL | Status: AC

## 2012-05-10 MED ORDER — ONDANSETRON HCL 4 MG PO TABS: 4.0000 mg | ORAL_TABLET | ORAL | Status: AC | PRN

## 2012-05-10 MED ORDER — SODIUM CHLORIDE 0.9 % IV SOLN: 420.0000 mg | INTRAVENOUS | Status: DC

## 2012-05-10 NOTE — Progress Notes (Signed)
Progress Note  Pt is seen ~ 6:30 pm  05/08/12 Pt is very awake and alert.  Dr. Thedore Mins is present.  He has good eye contact . He is no longer struggling with his soft wrist restraints. He is more alert, able to answer questions appropriately and is oriented to person and place. He is no longer shutting out and he wants to eat. Discussion with Dr. Thedore Mins concerns choice of medication because he is extremely restless and struggles against restraints. Dr. Thedore Mins and orders an EKG which demonstrates he has a prolonged QTc interval. For the medication of choice is no longer an option. Dr. Thedore Mins orders Librium to keep him calm.  Although patient is awake, it is not able to converse well. He has a very serious osteomyelitis, generalized infection with paraplegia and needs treatment. This is the first encounter with this patient when he is conscious RECOMMENDATION: 1. Agree with medication to stabilize patient cognitively and behaviorally 2. EKG reveals prolonged QTc interval which precludes option of the second-generation antipsychotics 3. Dr. seeing chooses Librium in lieu of a second-generation antipsychotic. 4. Will follow patient Tereso Unangst J. Ferol Luz, MD Psychiatrist  05/10/2012 3:01 AM
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
## 2012-05-10 NOTE — Discharge Summary (Signed)
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 Cardiac Low Salt Diet and 1.8 lit/day fluid restriction.  Disposition Home per request of patient's wife and patient they refused placement  If you experience worsening of your admission symptoms, develop shortness of breath, life threatening emergency, suicidal or homicidal thoughts you must seek medical attention immediately by calling 911 or  calling your MD immediately  if symptoms less severe.  You Must read complete instructions/literature along with all the possible adverse reactions/side effects for all the Medicines you take and that have been prescribed to you. Take any new Medicines after you have completely understood and accpet all the possible adverse reactions/side effects.   Do not drive and provide baby sitting services if your were admitted for syncope or siezures until you have seen by Primary MD or a Neurologist and advised to do so again.  Do not drive when taking Pain medications.    Do not take more than prescribed Pain, Sleep and Anxiety Medications  Special Instructions: If you have smoked or chewed Tobacco  in the last 2 yrs please stop  smoking, stop any regular Alcohol  and or any Recreational drug use.  Wear Seat belts while driving.   You have refused blood transfusions and further care in the hospital and your wife is aware and agreeable to it.  Follow-up Information    Follow up with SCHULTZ,DOUGLAS E, MD. Schedule an appointment as soon as possible for a visit in 3 days.   Contact information:   [address] [place] [address] [phone]       Follow up with [name], MD. Schedule an appointment as soon as possible for a visit in 1 week.   Contact information:   [address]  [address] [place] [address] [phone]            Discharge Medications     Medication List     As of 05/10/2012  1:56 PM    START taking these medications         ferrous sulfate 325 (65 FE) MG tablet   Take 1 tablet (325 mg total) by mouth daily with breakfast.      ondansetron 4 MG tablet   Commonly known as: ZOFRAN   Take 1 tablet (4 mg total) by mouth every 4 (four) hours as needed.   Replaces: ondansetron 4 MG/2ML Soln injection      sodium chloride 0.9 % SOLN 100 mL with DAPTOmycin 500 MG SOLR 420 mg   Inject 420 mg into the vein every other day.   Replaces: sodium chloride 0.9 % SOLN 100 mL with DAPTOmycin 500 MG SOLR      sodium chloride 0.9 % SOLN 50 mL with meropenem 500 MG SOLR   4 weeks supply starting from 05/10/2012. 500 mg every 12      CHANGE how you take these medications         LORazepam 0.5 MG tablet   Commonly known as: ATIVAN   Take 1 tablet (0.5 mg total) by mouth at bedtime.   What changed: - how often to take the med - reasons to take the med      CONTINUE taking these medications         acetaminophen 325 MG tablet   Commonly known as: TYLENOL      alum & mag hydroxide-simeth 200-200-20 MG/5ML suspension   Commonly known as: MAALOX/MYLANTA      aspirin EC 81 MG tablet      docusate sodium 100 MG capsule   Commonly known as:  COLACE   Take 1 capsule (100 mg total) by mouth daily.      Ensure Plus Liqd   Take 237 mLs by mouth 2 (two) times daily. Strawberry at 10:00 and chocolate at 14:00  ibuprofen 400 MG tablet   Commonly known as: ADVIL,MOTRIN      magnesium hydroxide 400 MG/5ML suspension   Commonly known as: MILK OF MAGNESIA      oxyCODONE 5 MG immediate release tablet   Commonly known as: Oxy IR/ROXICODONE   Take 2 tablets (10 mg total) by mouth every 4 (four) hours as needed. Pain      oxymorphone 20 MG 12 hr tablet   Commonly known as: OPANA ER      pantoprazole 40 MG tablet   Commonly known as: PROTONIX   Take 1 tablet (40 mg total) by mouth daily.      pregabalin 100 MG capsule   Commonly known as: LYRICA   Take 1 capsule (100 mg total) by mouth 3 (three) times daily.      senna 8.6 MG tablet   Commonly known as: SENOKOT      STOP taking these medications         acetaminophen 650 MG suppository   Commonly known as: TYLENOL      carisoprodol 350 MG tablet   Commonly known as: SOMA      fluconazole 2 mg/mL IVPB   Commonly known as: DIFLUCAN      guaiFENesin-dextromethorphan 100-10 MG/5ML syrup   Commonly known as: ROBITUSSIN DM      HYDROmorphone 1 MG/ML Soln injection   Commonly known as: DILAUDID      metoCLOPramide 5 MG/ML injection   Commonly known as: REGLAN      ondansetron 4 MG/2ML Soln injection   Commonly known as: ZOFRAN   Replaced by: ondansetron 4 MG tablet      PROMETHAZINE HCL IJ      sodium chloride 0.9 % SOLN 100 mL with DAPTOmycin 500 MG SOLR   Replaced by: sodium chloride 0.9 % SOLN 100 mL with DAPTOmycin 500 MG SOLR 420 mg      sodium phosphate enema   Commonly known as: FLEET      sucralfate 1 GM/10ML suspension   Commonly known as: CARAFATE      temazepam 15 MG capsule   Commonly known as: RESTORIL          Where to get your medications    These are the prescriptions that you need to pick up.   You may get these medications from any  pharmacy.         docusate sodium 100 MG capsule   Ensure Plus Liqd   ferrous sulfate 325 (65 FE) MG tablet   LORazepam 0.5 MG tablet   ondansetron 4 MG tablet   oxyCODONE 5 MG immediate release tablet   pantoprazole 40 MG tablet   pregabalin 100 MG capsule   sodium chloride 0.9 % SOLN 100 mL with DAPTOmycin 500 MG SOLR 420 mg   sodium chloride 0.9 % SOLN 50 mL with meropenem 500 MG SOLR               Total Time in preparing paper work, data evaluation and todays exam - 35 minutes  Susa Raring K M.D on 05/10/2012 at 1:56 PM  [facility] Office  [phone]

## 2012-05-10 NOTE — Progress Notes (Signed)
CARE MANAGEMENT NOTE 05/10/2012  Patient:  Clarence Sims   Account Number:  [id number]  Date Initiated:  05/10/2012  Documentation initiated by:  Vance Peper  Subjective/Objective Assessment:   51 yr old male adm for osteomyelitis, pt is paraplegic.     Action/Plan:   Patient will be discharged home with wife. PICC line for IV antibiotics. CM spoke with [facility]. Patient is active with them. Faxed orders and d/c summary to [phone].   Anticipated DC Date:  05/10/2012   Anticipated DC Plan:  HOME W HOME HEALTH SERVICES  In-house referral  Clinical Social Worker      DC Planning Services  CM consult      [facility] Choice  HOME HEALTH  Resumption Of Svcs/PTA Provider   Choice offered to / List presented to:  C-3 Spouse        HH arranged  HH-1 RN  IV Antibiotics      HH agency  [facility]   Status of service:  Completed, signed off Medicare Important Message given?   (If response is "NO", the following Medicare IM given date fields will be blank) Date Medicare IM given:   Date Additional Medicare IM given:    Discharge Disposition:  HOME W HOME HEALTH SERVICES  Per UR Regulation:    If discussed at Long Length of Stay Meetings, dates discussed:    Comments:
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
## 2012-05-12 DIAGNOSIS — L89309 Pressure ulcer of unspecified buttock, unspecified stage: Secondary | ICD-10-CM | POA: Insufficient documentation

## 2012-05-13 DIAGNOSIS — Z8673 Personal history of transient ischemic attack (TIA), and cerebral infarction without residual deficits: Secondary | ICD-10-CM | POA: Insufficient documentation

## 2012-05-13 DIAGNOSIS — E119 Type 2 diabetes mellitus without complications: Secondary | ICD-10-CM | POA: Insufficient documentation

## 2012-05-13 DIAGNOSIS — I10 Essential (primary) hypertension: Secondary | ICD-10-CM | POA: Insufficient documentation

## 2012-05-13 DIAGNOSIS — F329 Major depressive disorder, single episode, unspecified: Secondary | ICD-10-CM | POA: Insufficient documentation

## 2012-05-13 DIAGNOSIS — F32A Depression, unspecified: Secondary | ICD-10-CM | POA: Insufficient documentation

## 2012-05-13 DIAGNOSIS — Z8546 Personal history of malignant neoplasm of prostate: Secondary | ICD-10-CM | POA: Insufficient documentation

## 2012-05-13 DIAGNOSIS — E785 Hyperlipidemia, unspecified: Secondary | ICD-10-CM | POA: Insufficient documentation

## 2012-05-13 DIAGNOSIS — F419 Anxiety disorder, unspecified: Secondary | ICD-10-CM | POA: Insufficient documentation

## 2014-07-13 DIAGNOSIS — M4620 Osteomyelitis of vertebra, site unspecified: Secondary | ICD-10-CM | POA: Insufficient documentation

## 2014-07-19 DIAGNOSIS — B965 Pseudomonas (aeruginosa) (mallei) (pseudomallei) as the cause of diseases classified elsewhere: Secondary | ICD-10-CM | POA: Insufficient documentation

## 2014-07-19 DIAGNOSIS — G9340 Encephalopathy, unspecified: Secondary | ICD-10-CM | POA: Insufficient documentation

## 2015-03-11 DIAGNOSIS — G839 Paralytic syndrome, unspecified: Secondary | ICD-10-CM | POA: Insufficient documentation

## 2016-06-06 DIAGNOSIS — F602 Antisocial personality disorder: Secondary | ICD-10-CM | POA: Insufficient documentation

## 2016-06-29 DIAGNOSIS — J90 Pleural effusion, not elsewhere classified: Secondary | ICD-10-CM | POA: Insufficient documentation

## 2016-06-29 DIAGNOSIS — J189 Pneumonia, unspecified organism: Secondary | ICD-10-CM | POA: Insufficient documentation

## 2016-06-29 DIAGNOSIS — B182 Chronic viral hepatitis C: Secondary | ICD-10-CM | POA: Insufficient documentation

## 2017-04-26 DIAGNOSIS — R509 Fever, unspecified: Secondary | ICD-10-CM

## 2017-04-26 DIAGNOSIS — R319 Hematuria, unspecified: Secondary | ICD-10-CM

## 2017-04-26 DIAGNOSIS — L0390 Cellulitis, unspecified: Secondary | ICD-10-CM

## 2017-04-26 DIAGNOSIS — A419 Sepsis, unspecified organism: Secondary | ICD-10-CM

## 2017-04-26 DIAGNOSIS — G8220 Paraplegia, unspecified: Secondary | ICD-10-CM | POA: Diagnosis not present

## 2017-04-27 DIAGNOSIS — G8220 Paraplegia, unspecified: Secondary | ICD-10-CM | POA: Diagnosis not present

## 2017-04-27 DIAGNOSIS — R509 Fever, unspecified: Secondary | ICD-10-CM | POA: Diagnosis not present

## 2017-04-27 DIAGNOSIS — R319 Hematuria, unspecified: Secondary | ICD-10-CM | POA: Diagnosis not present

## 2017-04-27 DIAGNOSIS — L0390 Cellulitis, unspecified: Secondary | ICD-10-CM | POA: Diagnosis not present

## 2017-04-27 DIAGNOSIS — A419 Sepsis, unspecified organism: Secondary | ICD-10-CM | POA: Diagnosis not present

## 2017-05-14 DIAGNOSIS — M4624 Osteomyelitis of vertebra, thoracic region: Secondary | ICD-10-CM

## 2017-05-14 DIAGNOSIS — I10 Essential (primary) hypertension: Secondary | ICD-10-CM | POA: Diagnosis not present

## 2017-05-14 DIAGNOSIS — L8990 Pressure ulcer of unspecified site, unspecified stage: Secondary | ICD-10-CM

## 2017-05-14 DIAGNOSIS — G8220 Paraplegia, unspecified: Secondary | ICD-10-CM | POA: Diagnosis not present

## 2017-05-14 DIAGNOSIS — N3000 Acute cystitis without hematuria: Secondary | ICD-10-CM

## 2017-05-14 DIAGNOSIS — G894 Chronic pain syndrome: Secondary | ICD-10-CM | POA: Diagnosis not present

## 2017-05-14 DIAGNOSIS — E119 Type 2 diabetes mellitus without complications: Secondary | ICD-10-CM

## 2017-05-14 DIAGNOSIS — L0390 Cellulitis, unspecified: Secondary | ICD-10-CM | POA: Diagnosis not present

## 2018-10-28 DIAGNOSIS — Z01818 Encounter for other preprocedural examination: Secondary | ICD-10-CM

## 2019-04-03 ENCOUNTER — Inpatient Hospital Stay (HOSPITAL_COMMUNITY)
Admission: EM | Admit: 2019-04-03 | Discharge: 2019-04-12 | DRG: 871 | Discharge status: Against Medical Advice | Payer: Medicaid Other | Attending: Internal Medicine | Admitting: Internal Medicine

## 2019-04-03 ENCOUNTER — Other Ambulatory Visit: Payer: Self-pay

## 2019-04-03 ENCOUNTER — Emergency Department (HOSPITAL_COMMUNITY): Payer: Medicaid Other

## 2019-04-03 DIAGNOSIS — M4626 Osteomyelitis of vertebra, lumbar region: Secondary | ICD-10-CM | POA: Diagnosis present

## 2019-04-03 DIAGNOSIS — Z7982 Long term (current) use of aspirin: Secondary | ICD-10-CM

## 2019-04-03 DIAGNOSIS — A4102 Sepsis due to Methicillin resistant Staphylococcus aureus: Secondary | ICD-10-CM | POA: Diagnosis present

## 2019-04-03 DIAGNOSIS — F1910 Other psychoactive substance abuse, uncomplicated: Secondary | ICD-10-CM | POA: Diagnosis present

## 2019-04-03 DIAGNOSIS — L723 Sebaceous cyst: Secondary | ICD-10-CM | POA: Diagnosis present

## 2019-04-03 DIAGNOSIS — D638 Anemia in other chronic diseases classified elsewhere: Secondary | ICD-10-CM | POA: Diagnosis present

## 2019-04-03 DIAGNOSIS — N319 Neuromuscular dysfunction of bladder, unspecified: Secondary | ICD-10-CM | POA: Diagnosis present

## 2019-04-03 DIAGNOSIS — E1165 Type 2 diabetes mellitus with hyperglycemia: Secondary | ICD-10-CM | POA: Diagnosis present

## 2019-04-03 DIAGNOSIS — Z8701 Personal history of pneumonia (recurrent): Secondary | ICD-10-CM

## 2019-04-03 DIAGNOSIS — E1169 Type 2 diabetes mellitus with other specified complication: Secondary | ICD-10-CM | POA: Diagnosis present

## 2019-04-03 DIAGNOSIS — M6008 Infective myositis, other site: Secondary | ICD-10-CM

## 2019-04-03 DIAGNOSIS — Z884 Allergy status to anesthetic agent status: Secondary | ICD-10-CM | POA: Diagnosis not present

## 2019-04-03 DIAGNOSIS — Z720 Tobacco use: Secondary | ICD-10-CM | POA: Diagnosis not present

## 2019-04-03 DIAGNOSIS — G8220 Paraplegia, unspecified: Secondary | ICD-10-CM | POA: Diagnosis present

## 2019-04-03 DIAGNOSIS — Z8673 Personal history of transient ischemic attack (TIA), and cerebral infarction without residual deficits: Secondary | ICD-10-CM

## 2019-04-03 DIAGNOSIS — E869 Volume depletion, unspecified: Secondary | ICD-10-CM | POA: Diagnosis present

## 2019-04-03 DIAGNOSIS — G894 Chronic pain syndrome: Secondary | ICD-10-CM | POA: Diagnosis not present

## 2019-04-03 DIAGNOSIS — L89154 Pressure ulcer of sacral region, stage 4: Secondary | ICD-10-CM | POA: Diagnosis present

## 2019-04-03 DIAGNOSIS — M4620 Osteomyelitis of vertebra, site unspecified: Secondary | ICD-10-CM | POA: Diagnosis not present

## 2019-04-03 DIAGNOSIS — Z20828 Contact with and (suspected) exposure to other viral communicable diseases: Secondary | ICD-10-CM | POA: Diagnosis present

## 2019-04-03 DIAGNOSIS — E871 Hypo-osmolality and hyponatremia: Secondary | ICD-10-CM | POA: Diagnosis present

## 2019-04-03 DIAGNOSIS — M549 Dorsalgia, unspecified: Secondary | ICD-10-CM | POA: Diagnosis not present

## 2019-04-03 DIAGNOSIS — Z8739 Personal history of other diseases of the musculoskeletal system and connective tissue: Secondary | ICD-10-CM

## 2019-04-03 DIAGNOSIS — Z5329 Procedure and treatment not carried out because of patient's decision for other reasons: Secondary | ICD-10-CM | POA: Diagnosis not present

## 2019-04-03 DIAGNOSIS — R0600 Dyspnea, unspecified: Secondary | ICD-10-CM

## 2019-04-03 DIAGNOSIS — F17220 Nicotine dependence, chewing tobacco, uncomplicated: Secondary | ICD-10-CM | POA: Diagnosis not present

## 2019-04-03 DIAGNOSIS — A419 Sepsis, unspecified organism: Secondary | ICD-10-CM | POA: Diagnosis present

## 2019-04-03 DIAGNOSIS — R569 Unspecified convulsions: Secondary | ICD-10-CM | POA: Diagnosis not present

## 2019-04-03 DIAGNOSIS — M4646 Discitis, unspecified, lumbar region: Secondary | ICD-10-CM | POA: Diagnosis present

## 2019-04-03 DIAGNOSIS — D509 Iron deficiency anemia, unspecified: Secondary | ICD-10-CM | POA: Diagnosis present

## 2019-04-03 DIAGNOSIS — R7881 Bacteremia: Secondary | ICD-10-CM | POA: Diagnosis present

## 2019-04-03 DIAGNOSIS — G061 Intraspinal abscess and granuloma: Secondary | ICD-10-CM | POA: Diagnosis present

## 2019-04-03 DIAGNOSIS — B9562 Methicillin resistant Staphylococcus aureus infection as the cause of diseases classified elsewhere: Secondary | ICD-10-CM | POA: Diagnosis present

## 2019-04-03 DIAGNOSIS — I10 Essential (primary) hypertension: Secondary | ICD-10-CM | POA: Diagnosis present

## 2019-04-03 DIAGNOSIS — M6289 Other specified disorders of muscle: Secondary | ICD-10-CM

## 2019-04-03 DIAGNOSIS — B1920 Unspecified viral hepatitis C without hepatic coma: Secondary | ICD-10-CM | POA: Diagnosis present

## 2019-04-03 DIAGNOSIS — Z8546 Personal history of malignant neoplasm of prostate: Secondary | ICD-10-CM

## 2019-04-03 DIAGNOSIS — Z79899 Other long term (current) drug therapy: Secondary | ICD-10-CM

## 2019-04-03 DIAGNOSIS — F4325 Adjustment disorder with mixed disturbance of emotions and conduct: Secondary | ICD-10-CM

## 2019-04-03 DIAGNOSIS — K219 Gastro-esophageal reflux disease without esophagitis: Secondary | ICD-10-CM | POA: Diagnosis present

## 2019-04-03 DIAGNOSIS — Z833 Family history of diabetes mellitus: Secondary | ICD-10-CM

## 2019-04-03 DIAGNOSIS — N390 Urinary tract infection, site not specified: Secondary | ICD-10-CM | POA: Diagnosis present

## 2019-04-03 DIAGNOSIS — F4010 Social phobia, unspecified: Secondary | ICD-10-CM | POA: Diagnosis not present

## 2019-04-03 DIAGNOSIS — F319 Bipolar disorder, unspecified: Secondary | ICD-10-CM | POA: Diagnosis present

## 2019-04-03 DIAGNOSIS — Z86718 Personal history of other venous thrombosis and embolism: Secondary | ICD-10-CM

## 2019-04-03 DIAGNOSIS — Z8614 Personal history of Methicillin resistant Staphylococcus aureus infection: Secondary | ICD-10-CM | POA: Diagnosis not present

## 2019-04-03 DIAGNOSIS — I361 Nonrheumatic tricuspid (valve) insufficiency: Secondary | ICD-10-CM | POA: Diagnosis not present

## 2019-04-03 DIAGNOSIS — Z801 Family history of malignant neoplasm of trachea, bronchus and lung: Secondary | ICD-10-CM

## 2019-04-03 DIAGNOSIS — Z8249 Family history of ischemic heart disease and other diseases of the circulatory system: Secondary | ICD-10-CM

## 2019-04-03 DIAGNOSIS — L02212 Cutaneous abscess of back [any part, except buttock]: Secondary | ICD-10-CM | POA: Diagnosis not present

## 2019-04-03 LAB — LACTIC ACID, PLASMA
Lactic Acid, Venous: 2.2 mmol/L (ref 0.5–1.9)
Lactic Acid, Venous: 0.9 mmol/L (ref 0.5–1.9)

## 2019-04-03 LAB — URINALYSIS, ROUTINE W REFLEX MICROSCOPIC
Bilirubin Urine: NEGATIVE
Glucose, UA: >=500 mg/dL — AB
Hgb urine dipstick: NEGATIVE
Ketones, ur: NEGATIVE mg/dL
Nitrite: POSITIVE — AB
Protein, ur: 30 mg/dL — AB
Specific Gravity, Urine: 1.022 (ref 1.005–1.030)
WBC, UA: >50 WBC/hpf — ABNORMAL HIGH (ref 0–5)
pH: 8 (ref 5.0–8.0)

## 2019-04-03 LAB — COMPREHENSIVE METABOLIC PANEL
ALT: 11 U/L (ref 0–44)
AST: 11 U/L — ABNORMAL LOW (ref 15–41)
Albumin: 2.6 g/dL — ABNORMAL LOW (ref 3.5–5.0)
Alkaline Phosphatase: 89 U/L (ref 38–126)
Anion gap: 14 (ref 5–15)
BUN: 11 mg/dL (ref 6–20)
CO2: 22 mmol/L (ref 22–32)
Calcium: 8.8 mg/dL — ABNORMAL LOW (ref 8.9–10.3)
Chloride: 96 mmol/L — ABNORMAL LOW (ref 98–111)
Creatinine, Ser: 0.89 mg/dL (ref 0.61–1.24)
GFR calc Af Amer: >60 mL/min (ref >60)
GFR calc non Af Amer: >60 mL/min (ref >60)
Glucose, Bld: 175 mg/dL — ABNORMAL HIGH (ref 70–99)
Potassium: 4.6 mmol/L (ref 3.5–5.1)
Sodium: 132 mmol/L — ABNORMAL LOW (ref 135–145)
Total Bilirubin: 0.5 mg/dL (ref 0.3–1.2)
Total Protein: 7.8 g/dL (ref 6.5–8.1)

## 2019-04-03 LAB — CBC WITH DIFFERENTIAL/PLATELET
Abs Immature Granulocytes: 0.14 10*3/uL — ABNORMAL HIGH (ref 0.00–0.07)
Basophils Absolute: 0 10*3/uL (ref 0.0–0.1)
Basophils Relative: 0 %
Eosinophils Absolute: 0 10*3/uL (ref 0.0–0.5)
Eosinophils Relative: 0 %
HCT: 30.3 % — ABNORMAL LOW (ref 39.0–52.0)
Hemoglobin: 8.5 g/dL — ABNORMAL LOW (ref 13.0–17.0)
Immature Granulocytes: 1 %
Lymphocytes Relative: 7 %
Lymphs Abs: 1.1 10*3/uL (ref 0.7–4.0)
MCH: 19.5 pg — ABNORMAL LOW (ref 26.0–34.0)
MCHC: 28.1 g/dL — ABNORMAL LOW (ref 30.0–36.0)
MCV: 69.5 fL — ABNORMAL LOW (ref 80.0–100.0)
Monocytes Absolute: 1 10*3/uL (ref 0.1–1.0)
Monocytes Relative: 7 %
Neutro Abs: 13.1 10*3/uL — ABNORMAL HIGH (ref 1.7–7.7)
Neutrophils Relative %: 85 %
Platelets: 511 10*3/uL — ABNORMAL HIGH (ref 150–400)
RBC: 4.36 MIL/uL (ref 4.22–5.81)
RDW: 18.6 % — ABNORMAL HIGH (ref 11.5–15.5)
WBC: 15.4 10*3/uL — ABNORMAL HIGH (ref 4.0–10.5)
nRBC: 0.1 % (ref 0.0–0.2)

## 2019-04-03 LAB — CBC
HCT: 25.5 % — ABNORMAL LOW (ref 39.0–52.0)
Hemoglobin: 7.4 g/dL — ABNORMAL LOW (ref 13.0–17.0)
MCH: 19.5 pg — ABNORMAL LOW (ref 26.0–34.0)
MCHC: 29 g/dL — ABNORMAL LOW (ref 30.0–36.0)
MCV: 67.1 fL — ABNORMAL LOW (ref 80.0–100.0)
Platelets: 499 10*3/uL — ABNORMAL HIGH (ref 150–400)
RBC: 3.8 MIL/uL — ABNORMAL LOW (ref 4.22–5.81)
RDW: 18.5 % — ABNORMAL HIGH (ref 11.5–15.5)
WBC: 16.7 10*3/uL — ABNORMAL HIGH (ref 4.0–10.5)
nRBC: 0 % (ref 0.0–0.2)

## 2019-04-03 LAB — PROTIME-INR
INR: 1.2 (ref 0.8–1.2)
Prothrombin Time: 14.8 seconds (ref 11.4–15.2)

## 2019-04-03 LAB — MAGNESIUM: Magnesium: 1.9 mg/dL (ref 1.7–2.4)

## 2019-04-03 LAB — CBG MONITORING, ED: Glucose-Capillary: 179 mg/dL — ABNORMAL HIGH (ref 70–99)

## 2019-04-03 LAB — C-REACTIVE PROTEIN: CRP: 26 mg/dL — ABNORMAL HIGH (ref <1.0)

## 2019-04-03 LAB — CREATININE, SERUM
Creatinine, Ser: 0.75 mg/dL (ref 0.61–1.24)
GFR calc Af Amer: >60 mL/min (ref >60)
GFR calc non Af Amer: >60 mL/min (ref >60)

## 2019-04-03 LAB — SEDIMENTATION RATE: Sed Rate: 75 mm/hr — ABNORMAL HIGH (ref 0–16)

## 2019-04-03 LAB — MRSA PCR SCREENING: MRSA by PCR: POSITIVE — AB

## 2019-04-03 LAB — SARS CORONAVIRUS 2 BY RT PCR (HOSPITAL ORDER, PERFORMED IN ~~LOC~~ HOSPITAL LAB): SARS Coronavirus 2: NEGATIVE

## 2019-04-03 LAB — GLUCOSE, CAPILLARY: Glucose-Capillary: 191 mg/dL — ABNORMAL HIGH (ref 70–99)

## 2019-04-03 LAB — TSH: TSH: 0.533 u[IU]/mL (ref 0.350–4.500)

## 2019-04-03 LAB — PHOSPHORUS: Phosphorus: 4.2 mg/dL (ref 2.5–4.6)

## 2019-04-03 MED ORDER — FENTANYL CITRATE (PF) 100 MCG/2ML IJ SOLN
50.0000 ug | Freq: Once | INTRAMUSCULAR | Status: AC
  Administered 2019-04-03: 17:00:00 50 ug via INTRAVENOUS
  Filled 2019-04-03: qty 2

## 2019-04-03 MED ORDER — SODIUM CHLORIDE 0.9 % IV BOLUS
1000.0000 mL | Freq: Once | INTRAVENOUS | Status: AC
  Administered 2019-04-03: 1000 mL via INTRAVENOUS

## 2019-04-03 MED ORDER — MORPHINE SULFATE (PF) 2 MG/ML IV SOLN
2.0000 mg | INTRAVENOUS | Status: DC | PRN
  Administered 2019-04-04 – 2019-04-12 (×31): 2 mg via INTRAVENOUS
  Filled 2019-04-03 (×33): qty 1

## 2019-04-03 MED ORDER — FENTANYL CITRATE (PF) 100 MCG/2ML IJ SOLN
50.0000 ug | Freq: Once | INTRAMUSCULAR | Status: AC
  Administered 2019-04-03: 50 ug via INTRAVENOUS
  Filled 2019-04-03: qty 2

## 2019-04-03 MED ORDER — MUPIROCIN 2 % EX OINT
1.0000 "application " | TOPICAL_OINTMENT | Freq: Two times a day (BID) | CUTANEOUS | Status: AC
  Administered 2019-04-04 – 2019-04-08 (×10): 1 via NASAL
  Filled 2019-04-03: qty 22

## 2019-04-03 MED ORDER — SODIUM CHLORIDE 0.9 % IV SOLN
2.0000 g | INTRAVENOUS | Status: DC
  Administered 2019-04-03: 2 g via INTRAVENOUS
  Filled 2019-04-03: qty 20

## 2019-04-03 MED ORDER — SODIUM CHLORIDE 0.9 % IV SOLN
INTRAVENOUS | Status: DC
  Administered 2019-04-04 – 2019-04-09 (×7): via INTRAVENOUS

## 2019-04-03 MED ORDER — CHLORHEXIDINE GLUCONATE CLOTH 2 % EX PADS
6.0000 | MEDICATED_PAD | Freq: Every day | CUTANEOUS | Status: AC
  Administered 2019-04-06 – 2019-04-08 (×3): 6 via TOPICAL

## 2019-04-03 MED ORDER — VANCOMYCIN HCL IN DEXTROSE 1-5 GM/200ML-% IV SOLN
1000.0000 mg | Freq: Once | INTRAVENOUS | Status: AC
  Administered 2019-04-03: 1000 mg via INTRAVENOUS
  Filled 2019-04-03: qty 200

## 2019-04-03 MED ORDER — ONDANSETRON HCL 4 MG/2ML IJ SOLN
4.0000 mg | Freq: Once | INTRAMUSCULAR | Status: AC
  Administered 2019-04-03: 4 mg via INTRAVENOUS
  Filled 2019-04-03: qty 2

## 2019-04-03 MED ORDER — VANCOMYCIN HCL 500 MG IV SOLR
500.0000 mg | INTRAVENOUS | Status: AC
  Administered 2019-04-04: 500 mg via INTRAVENOUS
  Filled 2019-04-03: qty 500

## 2019-04-03 MED ORDER — ENOXAPARIN SODIUM 40 MG/0.4ML ~~LOC~~ SOLN
40.0000 mg | SUBCUTANEOUS | Status: DC
  Administered 2019-04-03: 40 mg via SUBCUTANEOUS
  Filled 2019-04-03: qty 0.4

## 2019-04-03 MED ORDER — VANCOMYCIN HCL 10 G IV SOLR
1250.0000 mg | INTRAVENOUS | Status: DC
  Administered 2019-04-04 – 2019-04-07 (×4): 1250 mg via INTRAVENOUS
  Filled 2019-04-03 (×5): qty 1250

## 2019-04-03 MED ORDER — SODIUM CHLORIDE 0.9 % IV SOLN
2.0000 g | Freq: Once | INTRAVENOUS | Status: AC
  Administered 2019-04-03: 2 g via INTRAVENOUS
  Filled 2019-04-03: qty 2
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
## 2019-04-03 NOTE — ED Notes (Signed)
CBG Results of 179 reported to [place], Therapist, sports.

## 2019-04-03 NOTE — ED Provider Notes (Signed)
Patient signed out to me by Ascencion Dike, MD for admission.  In brief, patient presenting with urosepsis.  Has had nausea, vomiting, chills for intermittently for several weeks, worsened today.  Came in tachycardic to 130.  White count elevated at 15.  Initial lactic 2.2.  Urine positive for infection.  Patient with a history of MRSA skin infections and infections in his spine.  As there is an obvious source for his sepsis today, new MRI of the spine was obtained, but consider need for further evaluation in the future.  Discussed with Dr. Marthenia Rolling from [facility], patient to be admitted.  Dr. Marthenia Rolling requested that I evaluate a lesion on patient's back.  On my evaluation, patient with a draining lesion over his mid thoracic back.  No surrounding erythema, induration, or warmth.  No obvious tracking.  As lesion is already open and draining, I do not believe he needs further I&D or other emergent intervention.  Patient reporting that he is in a lot of pain, thus wanting to leave.  I discussed that I will give patient more pain medication, but I recommend that he stays in the hospital for further antibiotic treatment.  If he wants to leave, he will need to discuss this with the hospitalist who is now in charge of his care.   Franchot Heidelberg, PA-C 04/03/19 2030    Julianne Rice, MD 04/09/19 1345

## 2019-04-03 NOTE — ED Triage Notes (Signed)
Patient reports hx MRSA infections x 2 months - currently has multiple sores on abdomen and back that are open. Reports pain to open wounds that has progressively worsened. Intermittent fevers/chills over the last 2 months as well.

## 2019-04-03 NOTE — ED Notes (Signed)
Attempted report x1. 

## 2019-04-03 NOTE — Progress Notes (Signed)
Pharmacy Antibiotic Note  Clarence Sims is a 58 y.o. male admitted on 04/03/2019 with presenting with urosepsis. Pharmacy has been consulted for Vancomycin dosing for sepsis. Patient presented to ED  tachycardic to 130,  WBC elevated, initial lactic 2.2.  Urine positive for infection. Patient has history of MRSA skin infections and infections in his spine. Multiple sores on abdomen and back that are open.  New MRI of the spine was obtained, but consider need for further evaluation in the future. Vancomycin 1000 mg IV x1 given 8/20 @ 1846  Cefepime 8/20 x1 Ceftriaxone 8/20>> Vanc 8/20 >>   Plan: Give additional Vancomycin 500 mg IV x1 to equal total loading dose of 1500 mg then Vancomycin 1250 mg IV Q 24 hrs. Goal AUC 400-550. Expected AUC:  503 SCr used: 0.89 Monitor clinical progress, renal function. Check steady state vancomycin peak and trough per protocol if needed.    Height: 5\' 2"  (157.5 cm) Weight: 150 lb (68 kg) IBW/kg (Calculated) : 54.6  Temp (24hrs), Avg:100.2 F (37.9 C), Min:98.8 F (37.1 C), Max:100.9 F (38.3 C)  Recent Labs  Lab 04/03/19 1706 04/03/19 2043  WBC 15.4*  --   CREATININE 0.89  --   LATICACIDVEN 2.2* 0.9    Estimated Creatinine Clearance: 78.7 mL/min (by C-G formula based on SCr of 0.89 mg/dL).    No Known Allergies  Antimicrobials this admission: Cefepime 8/20 x1 Ceftriaxone 8/20>> Vanc 8/20 >>   Dose adjustments this admission:   Microbiology results: 8/20 Covid: negative 8/20 BCx: sent  Thank you for allowing pharmacy to be a part of this patient's care.  Arman Bogus 04/03/2019 9:48 PM
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
## 2019-04-04 ENCOUNTER — Inpatient Hospital Stay (HOSPITAL_COMMUNITY): Payer: Medicaid Other

## 2019-04-04 DIAGNOSIS — L89154 Pressure ulcer of sacral region, stage 4: Secondary | ICD-10-CM

## 2019-04-04 DIAGNOSIS — Z8614 Personal history of Methicillin resistant Staphylococcus aureus infection: Secondary | ICD-10-CM

## 2019-04-04 DIAGNOSIS — R7881 Bacteremia: Secondary | ICD-10-CM

## 2019-04-04 DIAGNOSIS — B9562 Methicillin resistant Staphylococcus aureus infection as the cause of diseases classified elsewhere: Secondary | ICD-10-CM | POA: Diagnosis present

## 2019-04-04 DIAGNOSIS — F1910 Other psychoactive substance abuse, uncomplicated: Secondary | ICD-10-CM | POA: Diagnosis present

## 2019-04-04 DIAGNOSIS — G894 Chronic pain syndrome: Secondary | ICD-10-CM | POA: Diagnosis present

## 2019-04-04 DIAGNOSIS — G8220 Paraplegia, unspecified: Secondary | ICD-10-CM

## 2019-04-04 DIAGNOSIS — Z8739 Personal history of other diseases of the musculoskeletal system and connective tissue: Secondary | ICD-10-CM

## 2019-04-04 DIAGNOSIS — F17220 Nicotine dependence, chewing tobacco, uncomplicated: Secondary | ICD-10-CM

## 2019-04-04 LAB — BASIC METABOLIC PANEL
Anion gap: 11 (ref 5–15)
BUN: 7 mg/dL (ref 6–20)
CO2: 21 mmol/L — ABNORMAL LOW (ref 22–32)
Calcium: 8.2 mg/dL — ABNORMAL LOW (ref 8.9–10.3)
Chloride: 101 mmol/L (ref 98–111)
Creatinine, Ser: 0.72 mg/dL (ref 0.61–1.24)
GFR calc Af Amer: >60 mL/min (ref >60)
GFR calc non Af Amer: >60 mL/min (ref >60)
Glucose, Bld: 162 mg/dL — ABNORMAL HIGH (ref 70–99)
Potassium: 4.3 mmol/L (ref 3.5–5.1)
Sodium: 133 mmol/L — ABNORMAL LOW (ref 135–145)

## 2019-04-04 LAB — BLOOD CULTURE ID PANEL (REFLEXED)

## 2019-04-04 LAB — HEMOGLOBIN A1C
Hgb A1c MFr Bld: 8 % — ABNORMAL HIGH (ref 4.8–5.6)
Mean Plasma Glucose: 182.9 mg/dL

## 2019-04-04 LAB — VITAMIN B12: Vitamin B-12: 298 pg/mL (ref 180–914)

## 2019-04-04 LAB — CBC
HCT: 25 % — ABNORMAL LOW (ref 39.0–52.0)
Hemoglobin: 7.2 g/dL — ABNORMAL LOW (ref 13.0–17.0)
MCH: 19.8 pg — ABNORMAL LOW (ref 26.0–34.0)
MCHC: 28.8 g/dL — ABNORMAL LOW (ref 30.0–36.0)
MCV: 68.7 fL — ABNORMAL LOW (ref 80.0–100.0)
Platelets: 452 10*3/uL — ABNORMAL HIGH (ref 150–400)
RBC: 3.64 MIL/uL — ABNORMAL LOW (ref 4.22–5.81)
RDW: 18.4 % — ABNORMAL HIGH (ref 11.5–15.5)
WBC: 13.8 10*3/uL — ABNORMAL HIGH (ref 4.0–10.5)
nRBC: 0 % (ref 0.0–0.2)

## 2019-04-04 LAB — IRON AND TIBC
Iron: 6 ug/dL — ABNORMAL LOW (ref 45–182)
Saturation Ratios: 3 % — ABNORMAL LOW (ref 17.9–39.5)
TIBC: 178 ug/dL — ABNORMAL LOW (ref 250–450)
UIBC: 172 ug/dL

## 2019-04-04 LAB — TSH: TSH: 0.376 u[IU]/mL (ref 0.350–4.500)

## 2019-04-04 LAB — HIV ANTIBODY (ROUTINE TESTING W REFLEX): HIV Screen 4th Generation wRfx: NONREACTIVE

## 2019-04-04 LAB — SODIUM, URINE, RANDOM: Sodium, Ur: 36 mmol/L

## 2019-04-04 LAB — GLUCOSE, CAPILLARY
Glucose-Capillary: 212 mg/dL — ABNORMAL HIGH (ref 70–99)
Glucose-Capillary: 179 mg/dL — ABNORMAL HIGH (ref 70–99)

## 2019-04-04 LAB — FERRITIN: Ferritin: 156 ng/mL (ref 24–336)

## 2019-04-04 MED ORDER — FLUOXETINE HCL 20 MG PO CAPS
40.0000 mg | ORAL_CAPSULE | Freq: Every day | ORAL | Status: DC
  Administered 2019-04-04 – 2019-04-12 (×9): 40 mg via ORAL
  Filled 2019-04-04 (×9): qty 2

## 2019-04-04 MED ORDER — ENOXAPARIN SODIUM 80 MG/0.8ML ~~LOC~~ SOLN
70.0000 mg | Freq: Two times a day (BID) | SUBCUTANEOUS | Status: DC
  Administered 2019-04-05 – 2019-04-06 (×4): 70 mg via SUBCUTANEOUS
  Filled 2019-04-04 (×4): qty 0.8

## 2019-04-04 MED ORDER — LINACLOTIDE 145 MCG PO CAPS
145.0000 ug | ORAL_CAPSULE | Freq: Every day | ORAL | Status: DC
  Administered 2019-04-05 – 2019-04-11 (×6): 145 ug via ORAL
  Filled 2019-04-04 (×9): qty 1

## 2019-04-04 MED ORDER — FERROUS SULFATE 325 (65 FE) MG PO TABS: 325.0000 mg | ORAL_TABLET | Freq: Every day | ORAL | Status: DC

## 2019-04-04 MED ORDER — PANTOPRAZOLE SODIUM 40 MG PO TBEC: 40.0000 mg | DELAYED_RELEASE_TABLET | Freq: Every day | ORAL | Status: DC

## 2019-04-04 MED ORDER — ONDANSETRON HCL 4 MG/2ML IJ SOLN
4.0000 mg | Freq: Four times a day (QID) | INTRAMUSCULAR | Status: DC | PRN
  Administered 2019-04-04 – 2019-04-11 (×6): 4 mg via INTRAVENOUS
  Filled 2019-04-04 (×6): qty 2

## 2019-04-04 MED ORDER — LORAZEPAM 0.5 MG PO TABS: 0.5000 mg | ORAL_TABLET | Freq: Every day | ORAL | Status: DC

## 2019-04-04 MED ORDER — PREGABALIN 100 MG PO CAPS: 100.0000 mg | ORAL_CAPSULE | Freq: Three times a day (TID) | ORAL | Status: DC

## 2019-04-04 MED ORDER — INSULIN ASPART 100 UNIT/ML ~~LOC~~ SOLN
4.0000 [IU] | Freq: Three times a day (TID) | SUBCUTANEOUS | Status: DC
  Administered 2019-04-04 – 2019-04-10 (×16): 4 [IU] via SUBCUTANEOUS

## 2019-04-04 MED ORDER — MUPIROCIN 2 % EX OINT
TOPICAL_OINTMENT | Freq: Every day | CUTANEOUS | Status: DC
  Administered 2019-04-04 – 2019-04-11 (×9): via TOPICAL
  Filled 2019-04-04 (×3): qty 22

## 2019-04-04 MED ORDER — APIXABAN 5 MG PO TABS: 5.0000 mg | ORAL_TABLET | Freq: Two times a day (BID) | ORAL | Status: DC

## 2019-04-04 MED ORDER — BACLOFEN 20 MG PO TABS
20.0000 mg | ORAL_TABLET | Freq: Three times a day (TID) | ORAL | Status: DC
  Administered 2019-04-04 (×2): 20 mg via ORAL
  Filled 2019-04-04 (×3): qty 1

## 2019-04-04 MED ORDER — BUSPIRONE HCL 5 MG PO TABS
10.0000 mg | ORAL_TABLET | Freq: Three times a day (TID) | ORAL | Status: DC
  Administered 2019-04-04 – 2019-04-12 (×24): 10 mg via ORAL
  Filled 2019-04-04 (×24): qty 2

## 2019-04-04 MED ORDER — TRAZODONE HCL 50 MG PO TABS
150.0000 mg | ORAL_TABLET | Freq: Every day | ORAL | Status: DC
  Administered 2019-04-04 – 2019-04-11 (×7): 150 mg via ORAL
  Filled 2019-04-04 (×9): qty 3

## 2019-04-04 MED ORDER — INSULIN ASPART 100 UNIT/ML ~~LOC~~ SOLN
0.0000 [IU] | Freq: Every day | SUBCUTANEOUS | Status: DC
  Administered 2019-04-05 – 2019-04-06 (×2): 2 [IU] via SUBCUTANEOUS
  Administered 2019-04-07 – 2019-04-08 (×2): 4 [IU] via SUBCUTANEOUS
  Administered 2019-04-09 – 2019-04-10 (×2): 3 [IU] via SUBCUTANEOUS

## 2019-04-04 MED ORDER — INSULIN ASPART 100 UNIT/ML ~~LOC~~ SOLN
0.0000 [IU] | Freq: Three times a day (TID) | SUBCUTANEOUS | Status: DC
  Administered 2019-04-04: 5 [IU] via SUBCUTANEOUS
  Administered 2019-04-05 – 2019-04-06 (×4): 3 [IU] via SUBCUTANEOUS
  Administered 2019-04-06: 5 [IU] via SUBCUTANEOUS
  Administered 2019-04-07 (×2): 2 [IU] via SUBCUTANEOUS
  Administered 2019-04-08 (×2): 5 [IU] via SUBCUTANEOUS
  Administered 2019-04-09: 13:00:00 3 [IU] via SUBCUTANEOUS
  Administered 2019-04-09 (×2): 5 [IU] via SUBCUTANEOUS
  Administered 2019-04-10: 15 [IU] via SUBCUTANEOUS
  Administered 2019-04-10 (×2): 5 [IU] via SUBCUTANEOUS
  Administered 2019-04-11 – 2019-04-12 (×2): 3 [IU] via SUBCUTANEOUS

## 2019-04-04 MED ORDER — ENOXAPARIN SODIUM 80 MG/0.8ML ~~LOC~~ SOLN
70.0000 mg | SUBCUTANEOUS | Status: AC
  Administered 2019-04-04: 70 mg via SUBCUTANEOUS
  Filled 2019-04-04: qty 0.8

## 2019-04-04 MED ORDER — OXYCODONE-ACETAMINOPHEN 5-325 MG PO TABS
1.0000 | ORAL_TABLET | Freq: Once | ORAL | Status: DC
  Filled 2019-04-04 (×3): qty 1

## 2019-04-04 NOTE — Progress Notes (Signed)
RN reviewed MEW score with MD at morning rounds. Frequency of vital signs increased. No acute changes to pt's baseline. Pain management main RN focus. RN monitored pt's MEW scale throughout shift. Findings reported to night shift RN.

## 2019-04-04 NOTE — Consult Note (Addendum)
[name]    Date of Admission:  04/03/2019           Day 1 vancomycin        Day 1 ceftriaxone       Reason for Consult: Automatic consultation for MRSA bacteremia     Assessment: He has recurrent MRSA bacteremia and based on his understanding of test results at [facility] he may have recurrent vertebral infection.  I will continue vancomycin alone and order repeat blood cultures and transthoracic echocardiogram.  It will be helpful to obtain copies of [facility] records  Plan: 1. Continue vancomycin 2. Discontinue ceftriaxone 3. Repeat blood cultures in a.m. 4. Hold off on empiric placement until blood cultures are negative for at least 48 hours 5. Transthoracic echocardiogram 6. Recommend obtaining recent records from [facility] 7. Please call me for any infectious disease questions this weekend  Principal Problem:   MRSA bacteremia Active Problems:   Paraplegia following spinal cord injury ([place])   Neurogenic bladder   Sacral decubitus ulcer, stage IV (H[facility])   Microcytic anemia   Hx of discitis   Polysubstance abuse (H[facility])   Chronic pain syndrome   Scheduled Meds:  Chlorhexidine Gluconate Cloth  6 each Topical Q0600   enoxaparin (LOVENOX) injection  40 mg Subcutaneous Q24H   mupirocin ointment  1 application Nasal BID   mupirocin ointment   Topical Daily   Continuous Infusions:  sodium chloride 125 mL/hr at 04/04/19 0414   cefTRIAXone (ROCEPHIN)  IV 2 g (04/03/19 2336)   vancomycin     PRN Meds:.morphine injection, ondansetron (ZOFRAN) IV  HPI: Clarence Sims is a 58 y.o. male who developed paraplegia after C-spine surgery and in 2012.  He has had multiple admissions for infection and vertebral infection since that time.  He was hospitalized here in 2013 with T10 osteomyelitis.  Notes indicate that he had problems with active injecting drug use during the hospitalization.  He was  hospitalized at [facility] in 2017 with an epidural abscess extending from T2-10 and MRSA bacteremia.  He says that he started feeling bad about 3 weeks ago.  He had fever and chills.  He was hospitalized at [facility] in [facility] and was told that he had MRSA in his blood and spine again.  They told him that he would need 40 days of IV antibiotics.  He left AGAINST MEDICAL ADVICE about 3 weeks ago and says that he now regrets that.  He has been feeling worse leading up to admission here yesterday.  1 of 2 admission blood cultures is growing MRSA.  He developed a tender, draining sore on his ocular back shortly before he all 3 weeks ago. He has had problems with chronic skin ulcers.  Biopsy was hospitalized in 2017.  Autoimmune disorders were considered.  He also has a chronic sacral decubitus.   Review of Systems: Review of Systems  Constitutional: Positive for chills, fever and malaise/fatigue. Negative for diaphoresis.  Respiratory: Positive for cough. Negative for sputum production and shortness of breath.   Cardiovascular: Negative for chest pain.  Gastrointestinal: Negative for abdominal pain, nausea and vomiting.       He requires rectal stimulation to have a bowel movement. He says that he has not had a bowel movement for 3 days.  Musculoskeletal: Positive for back pain.    Past Medical History:  Diagnosis Date   Acute  renal failure (ARF)    "that's why I'm Clarence Sims" (05/01/2012)   Anxiety    Chronic lower back pain    Chronic osteomyelitis    Diabetes mellitus    "used to take Metformin; told me I didn't need it anymore" (05/01/2012)   DVT, bilateral lower limbs 11/2010   GERD (gastroesophageal reflux disease)    H/O hiatal hernia 1980's   History of blood transfusion 02/2012; 04/2012   "I've had 4 units last few days" (05/01/2012)   Hypertension    "history" (05/01/2012)   Paraplegia following spinal cord injury 10/2010;  12/2011   recovered; reoccurred     Pneumonia 11/13/2010   Prostate cancer    Spinal cord infarction     Social History   Tobacco Use   Smoking status: Former Smoker    Packs/day: 0.12    Years: 30.00    Pack years: 3.60    Types: Cigarettes    Quit date: 11/12/2009    Years since quitting: 9.3   Smokeless tobacco: Current User    Types: Snuff  Substance Use Topics   Alcohol use: Yes    Comment: 05/01/2012 "stopped alcohol years ago"   Drug use: Yes    Types: Cocaine    Family History  Problem Relation Age of Onset   Cancer Mother        lung   Hypertension Father    Diabetes Father    Suicidality Father    No Known Allergies  OBJECTIVE: Blood pressure 101/60, pulse (!) 113, temperature 98.2 F (36.8 C), temperature source Oral, resp. rate (!) 22, height 5\' 2"  (1.575 m), weight 68.9 kg, SpO2 93 %.  Physical Exam Constitutional:      Comments: He appears moderately uncomfortable resting quietly in bed.  Cardiovascular:     Rate and Rhythm: Regular rhythm. Tachycardia present.     Heart sounds: No murmur.     Comments: He is tachycardic Pulmonary:     Effort: Pulmonary effort is normal.     Breath sounds: Normal breath sounds.  Abdominal:     General: There is no distension.     Palpations: Abdomen is soft.     Tenderness: There is no abdominal tenderness.  Skin:    Comments: He has a tender boil/cyst on his upper back.  He has scattered excoriations and scars.     Lab Results Lab Results  Component Value Date   WBC 13.8 (H) 04/04/2019   HGB 7.2 (L) 04/04/2019   HCT 25.0 (L) 04/04/2019   MCV 68.7 (L) 04/04/2019   PLT 452 (H) 04/04/2019    Lab Results  Component Value Date   CREATININE 0.72 04/04/2019   BUN 7 04/04/2019   NA 133 (L) 04/04/2019   K 4.3 04/04/2019   CL 101 04/04/2019   CO2 21 (L) 04/04/2019    Lab Results  Component Value Date   ALT 11 04/03/2019   AST 11 (L) 04/03/2019   ALKPHOS 89 04/03/2019   BILITOT 0.5 04/03/2019     Microbiology: Recent  Results (from the past 240 hour(s))  Culture, blood (Routine x 2)     Status: None (Preliminary result)   Collection Time: 04/03/19  3:30 PM   Specimen: BLOOD  Result Value Ref Range Status   Specimen Description BLOOD RIGHT ANTECUBITAL  Final   Special Requests   Final    BOTTLES DRAWN AEROBIC AND ANAEROBIC Blood Culture results may not be optimal due to an inadequate volume of blood received in  culture bottles   Culture  Setup Time   Final    AEROBIC BOTTLE ONLY GRAM POSITIVE COCCI Organism ID to follow CRITICAL RESULT CALLED TO, READ BACK BY AND VERIFIED WITH: Milford Cage [id number] MLM Performed at [facility], [address], [place], [place] [address]    Culture GRAM POSITIVE COCCI  Final   Report Status PENDING  Incomplete  Blood Culture ID Panel (Reflexed)     Status: Abnormal   Collection Time: 04/03/19  3:30 PM  Result Value Ref Range Status   Enterococcus species NOT DETECTED NOT DETECTED Final   Listeria monocytogenes NOT DETECTED NOT DETECTED Final   Staphylococcus species DETECTED (A) NOT DETECTED Final    Comment: CRITICAL RESULT CALLED TO, READ BACK BY AND VERIFIED WITH: PHARMD T BAUMEISTER [id number] MLM    Staphylococcus aureus (BCID) DETECTED (A) NOT DETECTED Final    Comment: Methicillin (oxacillin)-resistant Staphylococcus aureus (MRSA). MRSA is predictably resistant to beta-lactam antibiotics (except ceftaroline). Preferred therapy is vancomycin unless clinically contraindicated. Patient requires contact precautions if  hospitalized. CRITICAL RESULT CALLED TO, READ BACK BY AND VERIFIED WITH: PHARMD T BAUMEISTER [id number] MLM    Methicillin resistance DETECTED (A) NOT DETECTED Final    Comment: CRITICAL RESULT CALLED TO, READ BACK BY AND VERIFIED WITH: PHARMD T BAUMEISTER [id number] MLM    Streptococcus species NOT DETECTED NOT DETECTED Final   Streptococcus agalactiae NOT DETECTED NOT DETECTED Final   Streptococcus pneumoniae NOT  DETECTED NOT DETECTED Final   Streptococcus pyogenes NOT DETECTED NOT DETECTED Final   Acinetobacter baumannii NOT DETECTED NOT DETECTED Final   Enterobacteriaceae species NOT DETECTED NOT DETECTED Final   Enterobacter cloacae complex NOT DETECTED NOT DETECTED Final   Escherichia coli NOT DETECTED NOT DETECTED Final   Klebsiella oxytoca NOT DETECTED NOT DETECTED Final   Klebsiella pneumoniae NOT DETECTED NOT DETECTED Final   Proteus species NOT DETECTED NOT DETECTED Final   Serratia marcescens NOT DETECTED NOT DETECTED Final   Haemophilus influenzae NOT DETECTED NOT DETECTED Final   Neisseria meningitidis NOT DETECTED NOT DETECTED Final   Pseudomonas aeruginosa NOT DETECTED NOT DETECTED Final   Candida albicans NOT DETECTED NOT DETECTED Final   Candida glabrata NOT DETECTED NOT DETECTED Final   Candida krusei NOT DETECTED NOT DETECTED Final   Candida parapsilosis NOT DETECTED NOT DETECTED Final   Candida tropicalis NOT DETECTED NOT DETECTED Final    Comment: Performed at [facility], [place]. [address] [place], [place] [address]  Culture, blood (Routine x 2)     Status: None (Preliminary result)   Collection Time: 04/03/19  5:06 PM   Specimen: BLOOD RIGHT FOREARM  Result Value Ref Range Status   Specimen Description BLOOD RIGHT FOREARM  Final   Special Requests   Final    BOTTLES DRAWN AEROBIC AND ANAEROBIC Blood Culture results may not be optimal due to an inadequate volume of blood received in culture bottles   Culture   Final    NO GROWTH < 24 HOURS Performed at [facility], [place] [address], [place], [place] [address]    Report Status PENDING  Incomplete  SARS Coronavirus 2 [facility] order, Performed in [facility] hospital lab) Nasopharyngeal Nasopharyngeal Swab     Status: None   Collection Time: 04/03/19  5:34 PM   Specimen: Nasopharyngeal Swab  Result Value Ref Range Status   SARS Coronavirus 2 NEGATIVE NEGATIVE Final    Comment: (NOTE) If result is  NEGATIVE  SARS-CoV-2 target nucleic acids are NOT DETECTED. The SARS-CoV-2 RNA is generally detectable in upper and lower  respiratory specimens during the acute phase of infection. The lowest  concentration of SARS-CoV-2 viral copies this assay can detect is 250  copies / mL. A negative result does not preclude SARS-CoV-2 infection  and should not be used as the sole basis for treatment or other  patient management decisions.  A negative result may occur with  improper specimen collection / handling, submission of specimen other  than nasopharyngeal swab, presence of viral mutation(s) within the  areas targeted by this assay, and inadequate number of viral copies  (<250 copies / mL). A negative result must be combined with clinical  observations, patient history, and epidemiological information. If result is POSITIVE SARS-CoV-2 target nucleic acids are DETECTED. The SARS-CoV-2 RNA is generally detectable in upper and lower  respiratory specimens dur ing the acute phase of infection.  Positive  results are indicative of active infection with SARS-CoV-2.  Clinical  correlation with patient history and other diagnostic information is  necessary to determine patient infection status.  Positive results do  not rule out bacterial infection or co-infection with other viruses. If result is PRESUMPTIVE POSTIVE SARS-CoV-2 nucleic acids MAY BE PRESENT.   A presumptive positive result was obtained on the submitted specimen  and confirmed on repeat testing.  While 2019 novel coronavirus  (SARS-CoV-2) nucleic acids may be present in the submitted sample  additional confirmatory testing may be necessary for epidemiological  and / or clinical management purposes  to differentiate between  SARS-CoV-2 and other Sarbecovirus currently known to infect humans.  If clinically indicated additional testing with an alternate test  methodology [phone]) is advised. The SARS-CoV-2 RNA is generally  detectable  in upper and lower respiratory sp ecimens during the acute  phase of infection. The expected result is Negative. Fact Sheet for Patients:  StrictlyIdeas.no Fact Sheet for Healthcare Providers: BankingDealers.co.za This test is not yet approved or cleared by the [place] FDA and has been authorized for detection and/or diagnosis of SARS-CoV-2 by FDA under an Emergency Use Authorization (EUA).  This EUA will remain in effect (meaning this test can be used) for the duration of the COVID-19 declaration under Section 564(b)(1) of the Act, 21 U.S.C. section 360bbb-3(b)(1), unless the authorization is terminated or revoked sooner. Performed at [facility], [place] [address], [place], [place] [address]   MRSA PCR Screening     Status: Abnormal   Collection Time: 04/03/19  9:45 PM   Specimen: Nasal Mucosa; Nasopharyngeal  Result Value Ref Range Status   MRSA by PCR POSITIVE (A) NEGATIVE Final    Comment:        The GeneXpert MRSA Assay (FDA approved for NASAL specimens only), is one component of a comprehensive MRSA colonization surveillance program. It is not intended to diagnose MRSA infection nor to guide or monitor treatment for MRSA infections. RESULT CALLED TO, READ BACK BY AND VERIFIED WITH: Donalda Ewings RN 04/03/19 2333 JDW Performed at [facility] [address], [place], [place] [address]     Michel Bickers, [facility] [phone] pager   [phone] cell 04/04/2019, 12:24 PM

## 2019-04-04 NOTE — Progress Notes (Signed)
ANTICOAGULATION CONSULT NOTE - Initial Consult  Pharmacy Consult for Lovenox Indication: h/o DVT  No Known Allergies  Patient Measurements: Height: 5\' 2"  (157.5 cm) Weight: 151 lb 14.4 oz (68.9 kg) IBW/kg (Calculated) : 54.6  Vital Signs: Temp: 98.7 F (37.1 C) (08/21 1246) Temp Source: Oral (08/21 1000) BP: 101/60 (08/21 0413) Pulse Rate: 113 (08/21 1200)  Labs: Recent Labs    04/03/19 1706 04/03/19 2252 04/04/19 0257 04/04/19 0605  HGB 8.5* 7.4*  --  7.2*  HCT 30.3* 25.5*  --  25.0*  PLT 511* 499*  --  452*  LABPROT 14.8  --   --   --   INR 1.2  --   --   --   CREATININE 0.89 0.75 0.72  --     Estimated Creatinine Clearance: 87.9 mL/min (by C-G formula based on SCr of 0.72 mg/dL).   Medical History: Past Medical History:  Diagnosis Date  . Acute renal failure (ARF)    "that's why I'm jere" (05/01/2012)  . Anxiety   . Chronic lower back pain   . Chronic osteomyelitis   . Diabetes mellitus    "used to take Metformin; told me I didn't need it anymore" (05/01/2012)  . 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 GERD (gastroesophageal reflux disease)   . H/O hiatal hernia 1980's  . History of blood transfusion 02/2012; 04/2012   "I've had 4 units last few days" (05/01/2012)  . Hypertension    "history" (05/01/2012)  . 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 Pneumonia 11/13/2010  . Prostate cancer   . Spinal cord infarction    Assessment:  CC/HPI: 58 y.o. male admitted on 04/03/2019 with presenting with urosepsis. WBC elevated, initial lactic 2.2.  Urine positive for infection. Patient has history of MRSA skin infections and infections in his spine. Multiple sores on abdomen and back that are open.  New MRI of the spine was obtained, but consider need for further evaluation in the future.  PMH:  paraplegia following spinal cord injury, spinal cord infarction, vague history of spinal cord infection, chronic back pain and  osteomyelitis, pressure ulcers, renal impairment, prostate cancer and MRSA skin lesion, anxiety, chronic osteo, DM, DVT 4/12, GERD, HH, HTN,   Anticoag: Eliquis PTA for h/o DVT. Hgb only 7.2 Plts ok.  Goal of Therapy:  Anti-Xa level 0.6-1 units/ml 4hrs after LMWH dose given Monitor platelets by anticoagulation protocol: Yes   Plan:  - Hold home Eliquis and start Lovenox 70mg  BID with CBC q 72hrs.   Kerry-Anne Mezo S. Alford Highland, PharmD, [facility] Clinical Staff Pharmacist Eilene Ghazi [place] 04/04/2019,1:20 PM

## 2019-04-04 NOTE — Progress Notes (Signed)
PROGRESS NOTE    Shadi Scobey Duty  [id number] DOB: [birth year] DOA: 04/03/2019 PCP: Nicoletta Dress, MD   Brief Narrative:  58 year old with history of paraplegia following spinal cord injury/infarction, remote infection, chronic low back pain, history of osteomyelitis, pressure ulcers, prostate cancer came to the hospital with complains of overall weakness for the past week along with fevers and chills.  He is also had chronic indwelling Foley catheter which was changed about 2 weeks ago.  Upon hospitalization noted to be in urosepsis and evidence of infected sebaceous cyst on his back.  He was started on IV vancomycin and Rocephin.   Assessment & Plan:   Principal Problem:   MRSA bacteremia Active Problems:   Paraplegia following spinal cord injury ([place])   Neurogenic bladder   Sacral decubitus ulcer, stage IV ([facility])   Microcytic anemia   Hx of discitis   Polysubstance abuse ([facility])   Chronic pain syndrome   Sepsis secondary to complicated urinary tract infection Chronic indwelling Foley catheter Mid back sebaceous cyst, infected with drainage - Blood cultures growing gram-positive cocci.  Continue IV vancomycin.  Continue Rocephin for 24 hours, then discontinue it.  Patient is borderline hypotensive and tachycardic. - Lactate has trended down -Foley catheter to be changed today. -Follow-up culture data, repeat surveillance cultures tomorrow. -Echocardiogram -Routine wound care for his sebaceous cyst. -Wound care team following.  Hyponatremia secondary to intravascular volume depletion -Getting IV fluids.  Monitor sodium levels.  History of paraplegia with neurogenic bladder with chronic indwelling Foley -Foley to be changed out in the hospital.  Supportive care  Anemia of chronic disease -Hemoglobin is 7.2, around baseline.  No obvious evidence of bleeding - Low MCV, check iron studies.  History of hepatitis C - Unknown if this was treated in the past.  History  of chronic pain and illicit drug use/substance use -Currently awaiting his outpatient pharmacy to confirm his medications before we can resume his regimen.  Diabetes mellitus type 2 -Insulin sliding scale Accu-Chek. -Currently awaiting outpatient medication list so can be started on appropriate meds  Bipolar disorder -Awaiting outpatient med list  Patient has had previous history of vertebral osteomyelitis/epidural abscess with MRSA bacteremia.   DVT prophylaxis: Therapeutic dose of Lovenox Code Status: Full code Family Communication: None at bedside.  Call the number listed in the chart for the spouse, states it is not in service. Disposition Plan: Maintain hospital stay for IV antibiotics and treatment for urosepsis and bacteremia  Consultants:   None  Procedures:   None  Antimicrobials:   Vancomycin  Rocephin   Subjective: Sitting up in the bed, does not have much complaints.  Tells me he has had Foley for many years and was changed about 2 weeks ago by his wife at home.  Review of Systems Otherwise negative except as per HPI, including: General: Denies night sweats or unintended weight loss. Resp: Denies cough, wheezing, shortness of breath. Cardiac: Denies chest pain, palpitations, orthopnea, paroxysmal nocturnal dyspnea. GI: Denies abdominal pain, nausea, vomiting, diarrhea or constipation GU: Denies dysuria, frequency, hesitancy or incontinence MS: Denies muscle aches, joint pain or swelling Neuro: Denies headache, neurologic deficits (focal weakness, numbness, tingling), abnormal gait Psych: Denies anxiety, depression, SI/HI/AVH Skin: Denies new rashes or lesions ID: Denies sick contacts, exotic exposures, travel  Objective: Vitals:   04/04/19 1000 04/04/19 1100 04/04/19 1200 04/04/19 1246  BP:      Pulse: (!) 113 (!) 114 (!) 113   Resp: (!) 24 (!) 26 (!) 22  Temp:    98.7 F (37.1 C)  TempSrc:      SpO2: 95% 92% 93%   Weight:      Height:         Intake/Output Summary (Last 24 hours) at 04/04/2019 1302 Last data filed at 04/04/2019 0600 Gross per 24 hour  Intake 1330.53 ml  Output 800 ml  Net 530.53 ml   Filed Weights   04/03/19 1642 04/04/19 0600  Weight: 68 kg 68.9 kg    Examination:  General exam: Chronically ill frail appearing but overall appears comfortable. Respiratory system: Clear to auscultation. Respiratory effort normal. Cardiovascular system: S1 & S2 heard, RRR. No JVD, murmurs, rubs, gallops or clicks. No pedal edema. Gastrointestinal system: Abdomen is nondistended, soft and nontender. No organomegaly or masses felt. Normal bowel sounds heard. Central nervous system: Alert and oriented. No focal neurological deficits. Extremities: Paraplegia Skin: Sebaceous cyst noted on his mid back with purulent drainage Psychiatry: Judgement and insight appear normal. Mood & affect appropriate.   Chronic indwelling Foley  Data Reviewed:   CBC: Recent Labs  Lab 04/03/19 1706 04/03/19 2252 04/04/19 0605  WBC 15.4* 16.7* 13.8*  NEUTROABS 13.1*  --   --   HGB 8.5* 7.4* 7.2*  HCT 30.3* 25.5* 25.0*  MCV 69.5* 67.1* 68.7*  PLT 511* 499* [id number]*   Basic Metabolic Panel: Recent Labs  Lab 04/03/19 1706 04/03/19 2252 04/04/19 0257  NA 132*  --  133*  K 4.6  --  4.3  CL 96*  --  101  CO2 22  --  21*  GLUCOSE 175*  --  162*  BUN 11  --  7  CREATININE 0.89 0.75 0.72  CALCIUM 8.8*  --  8.2*  MG  --  1.9  --   PHOS  --  4.2  --    GFR: Estimated Creatinine Clearance: 87.9 mL/min (by C-G formula based on SCr of 0.72 mg/dL). Liver Function Tests: Recent Labs  Lab 04/03/19 1706  AST 11*  ALT 11  ALKPHOS 89  BILITOT 0.5  PROT 7.8  ALBUMIN 2.6*   No results for input(s): LIPASE, AMYLASE in the last 168 hours. No results for input(s): AMMONIA in the last 168 hours. Coagulation Profile: Recent Labs  Lab 04/03/19 1706  INR 1.2   Cardiac Enzymes: No results for input(s): CKTOTAL, CKMB, CKMBINDEX, TROPONINI  in the last 168 hours. BNP (last 3 results) No results for input(s): PROBNP in the last 8760 hours. HbA1C: No results for input(s): HGBA1C in the last 72 hours. CBG: Recent Labs  Lab 04/03/19 1736 04/03/19 2153  GLUCAP 179* 191*   Lipid Profile: No results for input(s): CHOL, HDL, LDLCALC, TRIG, CHOLHDL, LDLDIRECT in the last 72 hours. Thyroid Function Tests: Recent Labs    04/03/19 2252  TSH 0.533   Anemia Panel: No results for input(s): VITAMINB12, FOLATE, FERRITIN, TIBC, IRON, RETICCTPCT in the last 72 hours. Sepsis Labs: Recent Labs  Lab 04/03/19 1706 04/03/19 2043  LATICACIDVEN 2.2* 0.9    Recent Results (from the past 240 hour(s))  Culture, blood (Routine x 2)     Status: None (Preliminary result)   Collection Time: 04/03/19  3:30 PM   Specimen: BLOOD  Result Value Ref Range Status   Specimen Description BLOOD RIGHT ANTECUBITAL  Final   Special Requests   Final    BOTTLES DRAWN AEROBIC AND ANAEROBIC Blood Culture results may not be optimal due to an inadequate volume of blood received in culture bottles   Culture  Setup Time   Final    AEROBIC BOTTLE ONLY GRAM POSITIVE COCCI Organism ID to follow CRITICAL RESULT CALLED TO, READ BACK BY AND VERIFIED WITH: Milford Cage [id number] Performed at [facility], [address], [place], [place] [address]    Culture GRAM POSITIVE COCCI  Final   Report Status PENDING  Incomplete  Blood Culture ID Panel (Reflexed)     Status: Abnormal   Collection Time: 04/03/19  3:30 PM  Result Value Ref Range Status   Enterococcus species NOT DETECTED NOT DETECTED Final   Listeria monocytogenes NOT DETECTED NOT DETECTED Final   Staphylococcus species DETECTED (A) NOT DETECTED Final    Comment: CRITICAL RESULT CALLED TO, READ BACK BY AND VERIFIED WITH: PHARMD T BAUMEISTER [id number] MLM    Staphylococcus aureus (BCID) DETECTED (A) NOT DETECTED Final    Comment: Methicillin (oxacillin)-resistant Staphylococcus  aureus (MRSA). MRSA is predictably resistant to beta-lactam antibiotics (except ceftaroline). Preferred therapy is vancomycin unless clinically contraindicated. Patient requires contact precautions if  hospitalized. CRITICAL RESULT CALLED TO, READ BACK BY AND VERIFIED WITH: PHARMD T BAUMEISTER [id number] MLM    Methicillin resistance DETECTED (A) NOT DETECTED Final    Comment: CRITICAL RESULT CALLED TO, READ BACK BY AND VERIFIED WITH: PHARMD T BAUMEISTER [id number] MLM    Streptococcus species NOT DETECTED NOT DETECTED Final   Streptococcus agalactiae NOT DETECTED NOT DETECTED Final   Streptococcus pneumoniae NOT DETECTED NOT DETECTED Final   Streptococcus pyogenes NOT DETECTED NOT DETECTED Final   Acinetobacter baumannii NOT DETECTED NOT DETECTED Final   Enterobacteriaceae species NOT DETECTED NOT DETECTED Final   Enterobacter cloacae complex NOT DETECTED NOT DETECTED Final   Escherichia coli NOT DETECTED NOT DETECTED Final   Klebsiella oxytoca NOT DETECTED NOT DETECTED Final   Klebsiella pneumoniae NOT DETECTED NOT DETECTED Final   Proteus species NOT DETECTED NOT DETECTED Final   Serratia marcescens NOT DETECTED NOT DETECTED Final   Haemophilus influenzae NOT DETECTED NOT DETECTED Final   Neisseria meningitidis NOT DETECTED NOT DETECTED Final   Pseudomonas aeruginosa NOT DETECTED NOT DETECTED Final   Candida albicans NOT DETECTED NOT DETECTED Final   Candida glabrata NOT DETECTED NOT DETECTED Final   Candida krusei NOT DETECTED NOT DETECTED Final   Candida parapsilosis NOT DETECTED NOT DETECTED Final   Candida tropicalis NOT DETECTED NOT DETECTED Final    Comment: Performed at [facility], [place]. [address] [place], [place] [address]  Culture, blood (Routine x 2)     Status: None (Preliminary result)   Collection Time: 04/03/19  5:06 PM   Specimen: BLOOD RIGHT FOREARM  Result Value Ref Range Status   Specimen Description BLOOD RIGHT FOREARM  Final   Special Requests    Final    BOTTLES DRAWN AEROBIC AND ANAEROBIC Blood Culture results may not be optimal due to an inadequate volume of blood received in culture bottles   Culture   Final    NO GROWTH < 24 HOURS Performed at [facility], [place] [address], [place], [place] [address]    Report Status PENDING  Incomplete  SARS Coronavirus 2 [facility] order, Performed in [facility] hospital lab) Nasopharyngeal Nasopharyngeal Swab     Status: None   Collection Time: 04/03/19  5:34 PM   Specimen: Nasopharyngeal Swab  Result Value Ref Range Status   SARS Coronavirus 2 NEGATIVE NEGATIVE Final    Comment: (NOTE) If result is NEGATIVE SARS-CoV-2 target nucleic acids are NOT  DETECTED. The SARS-CoV-2 RNA is generally detectable in upper and lower  respiratory specimens during the acute phase of infection. The lowest  concentration of SARS-CoV-2 viral copies this assay can detect is 250  copies / mL. A negative result does not preclude SARS-CoV-2 infection  and should not be used as the sole basis for treatment or other  patient management decisions.  A negative result may occur with  improper specimen collection / handling, submission of specimen other  than nasopharyngeal swab, presence of viral mutation(s) within the  areas targeted by this assay, and inadequate number of viral copies  (<250 copies / mL). A negative result must be combined with clinical  observations, patient history, and epidemiological information. If result is POSITIVE SARS-CoV-2 target nucleic acids are DETECTED. The SARS-CoV-2 RNA is generally detectable in upper and lower  respiratory specimens dur ing the acute phase of infection.  Positive  results are indicative of active infection with SARS-CoV-2.  Clinical  correlation with patient history and other diagnostic information is  necessary to determine patient infection status.  Positive results do  not rule out bacterial infection or co-infection with other viruses. If result is  PRESUMPTIVE POSTIVE SARS-CoV-2 nucleic acids MAY BE PRESENT.   A presumptive positive result was obtained on the submitted specimen  and confirmed on repeat testing.  While 2019 novel coronavirus  (SARS-CoV-2) nucleic acids may be present in the submitted sample  additional confirmatory testing may be necessary for epidemiological  and / or clinical management purposes  to differentiate between  SARS-CoV-2 and other Sarbecovirus currently known to infect humans.  If clinically indicated additional testing with an alternate test  methodology [phone]) is advised. The SARS-CoV-2 RNA is generally  detectable in upper and lower respiratory sp ecimens during the acute  phase of infection. The expected result is Negative. Fact Sheet for Patients:  StrictlyIdeas.no Fact Sheet for Healthcare Providers: BankingDealers.co.za This test is not yet approved or cleared by the [place] FDA and has been authorized for detection and/or diagnosis of SARS-CoV-2 by FDA under an Emergency Use Authorization (EUA).  This EUA will remain in effect (meaning this test can be used) for the duration of the COVID-19 declaration under Section 564(b)(1) of the Act, 21 U.S.C. section 360bbb-3(b)(1), unless the authorization is terminated or revoked sooner. Performed at [facility], [place] [address] [place], [place] [address]   MRSA PCR Screening     Status: Abnormal   Collection Time: 04/03/19  9:45 PM   Specimen: Nasal Mucosa; Nasopharyngeal  Result Value Ref Range Status   MRSA by PCR POSITIVE (A) NEGATIVE Final    Comment:        The GeneXpert MRSA Assay (FDA approved for NASAL specimens only), is one component of a comprehensive MRSA colonization surveillance program. It is not intended to diagnose MRSA infection nor to guide or monitor treatment for MRSA infections. RESULT CALLED TO, READ BACK BY AND VERIFIED WITH: Donalda Ewings RN 04/03/19 2333 JDW  Performed at [facility] [address] [place], [place] [address]          Radiology Studies: Dg Chest 2 View  Result Date: 04/03/2019 CLINICAL DATA:  Chest pain and shortness of breath EXAM: CHEST - 2 VIEW COMPARISON:  May 13, 2017 FINDINGS: The heart size and mediastinal contours are within normal limits. The aorta is tortuous. There is minimal right pleural effusion. There is no focal infiltrate or pulmonary edema. No acute abnormalities identified in the bony  structures. IMPRESSION: No focal pneumonia. Minimal right pleural effusion. Electronically Signed   By: Abelardo Diesel M.D.   On: 04/03/2019 16:13        Scheduled Meds: . apixaban  5 mg Oral BID  . busPIRone  10 mg Oral TID  . Chlorhexidine Gluconate Cloth  6 each Topical Q0600  . [START ON 04/05/2019] ferrous sulfate  325 mg Oral Q breakfast  . FLUoxetine  40 mg Oral Daily  . [START ON 04/05/2019] linaclotide  145 mcg Oral QAC breakfast  . LORazepam  0.5 mg Oral QHS  . mupirocin ointment  1 application Nasal BID  . mupirocin ointment   Topical Daily  . pantoprazole  40 mg Oral Daily  . pregabalin  100 mg Oral TID  . traZODone  150 mg Oral QHS   Continuous Infusions: . sodium chloride 125 mL/hr at 04/04/19 1250  . vancomycin       LOS: 1 day   Time spent= 40 mins    Ankit Arsenio Loader, MD [facility]  If 7PM-7AM, please contact night-coverage [email or web address] 04/04/2019, 1:02 PM

## 2019-04-04 NOTE — Progress Notes (Addendum)
Patient arrived to unit [facility] bed 32 from emergency room.Patient assisted to bed by nursing staff.Patient has numerous round ulcerations noted chest,abdomen,arms and legs.Patient also has stage 3 pressure ulcer to sacrum and on upper back cyst that is draining.Patient groin and scrotum red microgaurd powder applied.Patient has chronic foley in place urine yellow with sediments noted.Oriented patient to unit and nurse call bell.Patient is paraplegic aware not to try to get out of bed without assistance from staff.Patient mews score in yellow was previously in red will continue to monitor patient.No acute distress noted at present time.

## 2019-04-04 NOTE — Progress Notes (Signed)
MEWS Guidelines - (patients age 56 and over)  Red - At High Risk for Deterioration Yellow - At risk for Deterioration  1. Go to room and assess patient 2. Validate data. Is this patient's baseline? If data confirmed: 3. Is this an acute change? 4. Administer prn meds/treatments as ordered. 5. Note Sepsis score 6. Review goals of care 7. Sports coach, RRT nurse and Provider. 8. Ask Provider to come to bedside.  9. Document patient condition/interventions/response. 10. Increase frequency of vital signs and focused assessments to at least q15 minutes x 4, then q30 minutes x2. - If stable, then q1h x3, then q4h x3 and then q8h or dept. routine. - If unstable, contact Provider & RRT nurse. Prepare for possible transfer. 11. Add entry in progress notes using the smart phrase ".MEWS". 1. Go to room and assess patient 2. Validate data. Is this patient's baseline? If data confirmed: 3. Is this an acute change? 4. Administer prn meds/treatments as ordered? 5. Note Sepsis score 6. Review goals of care 7. Sports coach and Provider 8. Call RRT nurse as needed. 9. Document patient condition/interventions/response. 10. Increase frequency of vital signs and focused assessments to at least q2h x2. - If stable, then q4h x2 and then q8h or dept. routine. - If unstable, contact Provider & RRT nurse. Prepare for possible transfer. 11. Add entry in progress notes using the smart phrase ".MEWS".  Green - Likely stable Lavender - Comfort Care Only  1. Continue routine/ordered monitoring.  2. Review goals of care. 1. Continue routine/ordered monitoring. 2. Review goals of care.    Patient mews score was in red in emergency room and then turned yellow.Patient mews has consisently been in yellow this shift.

## 2019-04-04 NOTE — Consult Note (Signed)
[facility] Nurse wound consult note Admitted for urosepsis but has history of MRSA skin infections and multiple nonintact skin lesions to arms legs and abdomen.   Reason for Consult:Stage 3 to sacrum, present on admission Upper mid back with nonintact draining sebaceous cyst.  Purulence oozing from wound bed.  Wound type:infectious inflammatory Pressure Injury POA: Yes  History of paraplegia.  Able to turn in bed for me and reposition self independently Measurement: SAcrum:  2 cm x 2.4 cm x 1 cm  Upper mid back  2 cm indurated cyst draining purulent effluent Scattered 1 cm x to 1.5 cm ruptured blisters present on arms legs and abdomen.  Patient covers with dry dressing at home.  Wound [id number] pink nongranulating Drainage (amount, consistency, odor) oozing cyst Periwound:cyst:  Induration and erythema  Tender to touch Dressing procedure/placement/frequency: Cleanse back wound and sacral wound with NS and pat dry.  Apply Aquacel ag for absorption and antimicrobial topical coverage.  Cover with foam dressing.  Change M/W/F Cleanse ruptured blisters to arms legs and abdomen with soap and water daily.  Apply mupirocin ointment.  Cover with dry dressing.  Will not follow at this time.  Please re-consult if needed.  Domenic Moras MSN, RN, FNP-BC CWON Wound, Ostomy, Continence Nurse Pager [phone]

## 2019-04-04 NOTE — Progress Notes (Signed)
  Echocardiogram 2D Echocardiogram has been attempted. Patient requests to speak to nurse and for exam to be done tomorrow. Will reattempt at later date.  Clarence Sims Clarence Sims 04/04/2019, 3:50 PM

## 2019-04-04 NOTE — Progress Notes (Signed)
PHARMACY - PHYSICIAN COMMUNICATION CRITICAL VALUE ALERT - BLOOD CULTURE IDENTIFICATION (BCID)  Clarence Sims is an 58 y.o. male who presented to [facility] on 04/03/2019 with a chief complaint of fever and chills   Assessment:  Patient has history of MRSA skin infections and infections in his spine. Multiple sores on abdomen and back that are open. He is positive in 1/4 blood culture bottles so far for MRSA. Will need further spinal workup since last time looks like 2013. Will also need repeat BCs and an echo  Name of physician (or Provider) Contacted: Dr. Reesa Chew  Current antibiotics:  Vancomycin  Ceftriaxone   Changes to prescribed antibiotics recommended:  Recommendation to stop ceftriaxone declined by the provider, wants to continue for an additional 24 hours and d/c tomorrow if remains stable  Continuing vancomycin and ceftriaxone   Results for orders placed or performed during the hospital encounter of 04/03/19  Blood Culture ID Panel (Reflexed) (Collected: 04/03/2019  3:30 PM)  Result Value Ref Range   Enterococcus species NOT DETECTED NOT DETECTED   Listeria monocytogenes NOT DETECTED NOT DETECTED   Staphylococcus species DETECTED (A) NOT DETECTED   Staphylococcus aureus (BCID) DETECTED (A) NOT DETECTED   Methicillin resistance DETECTED (A) NOT DETECTED   Streptococcus species NOT DETECTED NOT DETECTED   Streptococcus agalactiae NOT DETECTED NOT DETECTED   Streptococcus pneumoniae NOT DETECTED NOT DETECTED   Streptococcus pyogenes NOT DETECTED NOT DETECTED   Acinetobacter baumannii NOT DETECTED NOT DETECTED   Enterobacteriaceae species NOT DETECTED NOT DETECTED   Enterobacter cloacae complex NOT DETECTED NOT DETECTED   Escherichia coli NOT DETECTED NOT DETECTED   Klebsiella oxytoca NOT DETECTED NOT DETECTED   Klebsiella pneumoniae NOT DETECTED NOT DETECTED   Proteus species NOT DETECTED NOT DETECTED   Serratia marcescens NOT DETECTED NOT DETECTED   Haemophilus influenzae  NOT DETECTED NOT DETECTED   Neisseria meningitidis NOT DETECTED NOT DETECTED   Pseudomonas aeruginosa NOT DETECTED NOT DETECTED   Candida albicans NOT DETECTED NOT DETECTED   Candida glabrata NOT DETECTED NOT DETECTED   Candida krusei NOT DETECTED NOT DETECTED   Candida parapsilosis NOT DETECTED NOT DETECTED   Candida tropicalis NOT DETECTED NOT DETECTED    Phillis Haggis 04/04/2019  10:28 AM

## 2019-04-05 ENCOUNTER — Inpatient Hospital Stay (HOSPITAL_COMMUNITY): Payer: Medicaid Other

## 2019-04-05 DIAGNOSIS — I361 Nonrheumatic tricuspid (valve) insufficiency: Secondary | ICD-10-CM

## 2019-04-05 LAB — BASIC METABOLIC PANEL
Anion gap: 9 (ref 5–15)
BUN: 8 mg/dL (ref 6–20)
CO2: 22 mmol/L (ref 22–32)
Calcium: 8.2 mg/dL — ABNORMAL LOW (ref 8.9–10.3)
Chloride: 106 mmol/L (ref 98–111)
Creatinine, Ser: 0.72 mg/dL (ref 0.61–1.24)
GFR calc Af Amer: >60 mL/min (ref >60)
GFR calc non Af Amer: >60 mL/min (ref >60)
Glucose, Bld: 184 mg/dL — ABNORMAL HIGH (ref 70–99)
Potassium: 4 mmol/L (ref 3.5–5.1)
Sodium: 137 mmol/L (ref 135–145)

## 2019-04-05 LAB — CBC
HCT: 23.9 % — ABNORMAL LOW (ref 39.0–52.0)
Hemoglobin: 6.7 g/dL — CL (ref 13.0–17.0)
MCH: 19.5 pg — ABNORMAL LOW (ref 26.0–34.0)
MCHC: 28 g/dL — ABNORMAL LOW (ref 30.0–36.0)
MCV: 69.5 fL — ABNORMAL LOW (ref 80.0–100.0)
Platelets: 413 10*3/uL — ABNORMAL HIGH (ref 150–400)
RBC: 3.44 MIL/uL — ABNORMAL LOW (ref 4.22–5.81)
RDW: 18.6 % — ABNORMAL HIGH (ref 11.5–15.5)
WBC: 9.8 10*3/uL (ref 4.0–10.5)
nRBC: 0 % (ref 0.0–0.2)

## 2019-04-05 LAB — ECHOCARDIOGRAM COMPLETE
Height: 62 in
Weight: 2430.35 oz

## 2019-04-05 LAB — GLUCOSE, CAPILLARY
Glucose-Capillary: 155 mg/dL — ABNORMAL HIGH (ref 70–99)
Glucose-Capillary: 117 mg/dL — ABNORMAL HIGH (ref 70–99)
Glucose-Capillary: 180 mg/dL — ABNORMAL HIGH (ref 70–99)
Glucose-Capillary: 223 mg/dL — ABNORMAL HIGH (ref 70–99)

## 2019-04-05 LAB — MAGNESIUM: Magnesium: 2.1 mg/dL (ref 1.7–2.4)

## 2019-04-05 MED ORDER — PROMETHAZINE HCL 25 MG PO TABS: 25.0000 mg | ORAL_TABLET | Freq: Four times a day (QID) | ORAL | Status: DC | PRN

## 2019-04-05 MED ORDER — POLYETHYLENE GLYCOL 3350 17 G PO PACK
17.0000 g | PACK | Freq: Every day | ORAL | Status: DC | PRN
  Administered 2019-04-05: 17 g via ORAL
  Filled 2019-04-05: qty 1

## 2019-04-05 MED ORDER — SENNOSIDES-DOCUSATE SODIUM 8.6-50 MG PO TABS: 2.0000 | ORAL_TABLET | Freq: Every evening | ORAL | Status: DC | PRN

## 2019-04-05 MED ORDER — GADOBUTROL 1 MMOL/ML IV SOLN
7.0000 mL | Freq: Once | INTRAVENOUS | Status: AC | PRN
  Administered 2019-04-05: 7 mL via INTRAVENOUS

## 2019-04-05 MED ORDER — OXYCODONE-ACETAMINOPHEN 5-325 MG PO TABS
1.0000 | ORAL_TABLET | Freq: Every day | ORAL | Status: DC | PRN
  Administered 2019-04-05 – 2019-04-06 (×2): 1 via ORAL
  Filled 2019-04-05 (×2): qty 1

## 2019-04-05 MED ORDER — INSULIN GLARGINE 100 UNIT/ML ~~LOC~~ SOLN
15.0000 [IU] | Freq: Every day | SUBCUTANEOUS | Status: DC
  Administered 2019-04-05 – 2019-04-09 (×5): 15 [IU] via SUBCUTANEOUS
  Filled 2019-04-05 (×6): qty 0.15

## 2019-04-05 MED ORDER — NITROGLYCERIN 0.4 MG SL SUBL
SUBLINGUAL_TABLET | SUBLINGUAL | Status: AC
  Administered 2019-04-05: 0.4 mg
  Filled 2019-04-05: qty 3

## 2019-04-05 MED ORDER — FERROUS SULFATE 325 (65 FE) MG PO TABS
325.0000 mg | ORAL_TABLET | Freq: Two times a day (BID) | ORAL | Status: DC
  Administered 2019-04-05 – 2019-04-12 (×14): 325 mg via ORAL
  Filled 2019-04-05 (×14): qty 1

## 2019-04-05 MED ORDER — SODIUM CHLORIDE 0.9% IV SOLUTION: Freq: Once | INTRAVENOUS | Status: DC

## 2019-04-05 MED ORDER — BACLOFEN 20 MG PO TABS
40.0000 mg | ORAL_TABLET | Freq: Three times a day (TID) | ORAL | Status: DC
  Administered 2019-04-05 – 2019-04-12 (×22): 40 mg via ORAL
  Filled 2019-04-05 (×24): qty 2

## 2019-04-05 MED ORDER — LORAZEPAM 2 MG/ML IJ SOLN
INTRAMUSCULAR | Status: AC
  Administered 2019-04-05: 1 mg
  Filled 2019-04-05: qty 1

## 2019-04-05 MED ORDER — MORPHINE SULFATE (PF) 2 MG/ML IV SOLN
2.0000 mg | Freq: Once | INTRAVENOUS | Status: AC
  Administered 2019-04-05: 2 mg via INTRAVENOUS
  Filled 2019-04-05: qty 1

## 2019-04-05 NOTE — Progress Notes (Signed)
Upon assessment, PT pale and complaining of sudden 10/10 chest pain. Described pain as "someone stabbing me in the chest." Per chest pain emergency standing orders, obtained EKG, put PT on 4L O2, administered 1 SL nitro, alerted [facility] provider. Gave 2 mg Morphine per TRH. Administered 1 unit PRBC per hgb 6.7 at 0726. PT reports diminished pain 3/10 after blood administration. Will continue to monitor PT.

## 2019-04-05 NOTE — Progress Notes (Signed)
PROGRESS NOTE    Clarence Sims  [id number] DOB: July 21, 1961 DOA: 04/03/2019 PCP: Nicoletta Dress, MD   Brief Narrative:  58 year old with history of paraplegia following spinal cord injury/infarction, remote infection, chronic low back pain, history of osteomyelitis, pressure ulcers, prostate cancer came to the hospital with complains of overall weakness for the past week along with fevers and chills.  He is also had chronic indwelling Foley catheter which was changed about 2 weeks ago.  Upon hospitalization noted to be in urosepsis and evidence of infected sebaceous cyst on his back.  He was started on IV vancomycin and Rocephin.   Assessment & Plan:   Principal Problem:   MRSA bacteremia Active Problems:   Paraplegia following spinal cord injury ([place])   Neurogenic bladder   Sacral decubitus ulcer, stage IV (H[facility])   Microcytic anemia   Hx of discitis   Polysubstance abuse ([facility])   Chronic pain syndrome  MRSA bacteremia Sepsis secondary to complicated MRSA bactremia Chronic indwelling Foley catheter Mid back sebaceous cyst, infected with drainage - Blood cultures growing gram-positive cocci.  Continue IV vancomycin.  Appreciate input from Dr. Glenford Peers with him this morning on 8/22.  For now it appears his urine culture-gram-negative rods and Proteus are chronic colonizer.  We will monitor this clinically. - Lactate has trended down -Foley changed this admission -Blood cultures growing MRSA/gram-positive. -Echocardiogram-pending -Routine wound care for his sebaceous cyst. -Wound care team following. -MRI spine given hx of Vertebral Osteo.   Hyponatremia secondary to intravascular volume depletion -Getting IV fluids.  Monitor sodium levels.  History of paraplegia with neurogenic bladder with chronic indwelling Foley -Foley to be changed out in the hospital.  Supportive care  Anemia of chronic disease and microcytic -Baseline hemoglobin 7.0, today 6.7.  1  unit PRBC transfusion. - Low iron saturation, borderline low ferritin.  Start iron supplements with bowel regimen  History of hepatitis C - Unknown if this was treated in the past.  History of chronic pain and illicit drug use/substance use -Currently awaiting his outpatient pharmacy to confirm his medications before we can resume his regimen.  Diabetes mellitus type 2 -Insulin sliding scale Accu-Chek. -Currently awaiting outpatient medication list so can be started on appropriate meds  Bipolar disorder -Awaiting outpatient med list  Patient has had previous history of vertebral osteomyelitis/epidural abscess with MRSA bacteremia. Wife tells me patient had CVA back in 2012 and then was switched over to Eliquis by primary care provider Dr. Jola Schmidt.    DVT prophylaxis: Therapeutic dose of Lovenox Code Status: Full code Family Communication: Spoke with patient's wife Diane Disposition Plan: Maintain hospital stay for treatment for bacteremia  Consultants:   None  Procedures:   None  Antimicrobials:   Vancomycin    Subjective: Patient tells me his mouth feels dry but otherwise no other acute events.  Remains afebrile overnight.  He is unable to tell me why he is on Eliquis therefore he requested me to call his wife.  Review of Systems Otherwise negative except as per HPI, including: General = no fevers, chills, dizziness, malaise, fatigue HEENT/EYES = negative for pain, redness, loss of vision, double vision, blurred vision, loss of hearing, sore throat, hoarseness, dysphagia Cardiovascular= negative for chest pain, palpitation, murmurs, lower extremity swelling Respiratory/lungs= negative for shortness of breath, cough, hemoptysis, wheezing, mucus production Gastrointestinal= negative for nausea, vomiting,, abdominal pain, melena, hematemesis Genitourinary= negative for Dysuria, Hematuria, Change in Urinary Frequency MSK = Negative for arthralgia, myalgias, Back Pain,  Joint swelling  Neurology= Negative for headache, seizures, numbness, tingling  Psychiatry= Negative for anxiety, depression, suicidal and homocidal ideation Allergy/Immunology= Medication/Food allergy as listed  Skin= Negative for Rash, lesions, ulcers, itching  Objective: Vitals:   04/05/19 0012 04/05/19 0615 04/05/19 0701 04/05/19 0853  BP: 105/64 (!) 99/57 100/63 107/69  Pulse: (!) 114 (!) 113 (!) 107 (!) 117  Resp: 20 (!) 22 20 (!) 23  Temp: 99 F (37.2 C) 98.8 F (37.1 C)  98.2 F (36.8 C)  TempSrc: Oral Oral  Oral  SpO2: 96% 94% 94% 96%  Weight:      Height:        Intake/Output Summary (Last 24 hours) at 04/05/2019 1122 Last data filed at 04/05/2019 0615 Gross per 24 hour  Intake 2530.56 ml  Output 2300 ml  Net 230.56 ml   Filed Weights   04/03/19 1642 04/04/19 0600  Weight: 68 kg 68.9 kg    Examination:  Constitutional: NAD, calm, comfortable Eyes: PERRL, lids and conjunctivae normal ENMT: Mucous membranes are moist. Posterior pharynx clear of any exudate or lesions.Normal dentition.  Neck: normal, supple, no masses, no thyromegaly Respiratory: clear to auscultation bilaterally, no wheezing, no crackles. Normal respiratory effort. No accessory muscle use.  Cardiovascular: Regular rate and rhythm, no murmurs / rubs / gallops. No extremity edema. 2+ pedal pulses. No carotid bruits.  Abdomen: no tenderness, no masses palpated. No hepatosplenomegaly. Bowel sounds positive.  Musculoskeletal: Paraplegia Skin: Large sebaceous cyst noted on his mid back with drainage.  Dressing over it. Neurologic: CN 2-12 grossly intact. Sensation intact, DTR normal. Strength 5/5 in all 4.  Psychiatric: Alert awake oriented to name place and date  Chronic indwelling Foley  Data Reviewed:   CBC: Recent Labs  Lab 04/03/19 1706 04/03/19 2252 04/04/19 0605 04/05/19 0726  WBC 15.4* 16.7* 13.8* 9.8  NEUTROABS 13.1*  --   --   --   HGB 8.5* 7.4* 7.2* 6.7*  HCT 30.3* 25.5* 25.0*  23.9*  MCV 69.5* 67.1* 68.7* 69.5*  PLT 511* 499* 452* [id number]*   Basic Metabolic Panel: Recent Labs  Lab 04/03/19 1706 04/03/19 2252 04/04/19 0257 04/05/19 0726  NA 132*  --  133* 137  K 4.6  --  4.3 4.0  CL 96*  --  101 106  CO2 22  --  21* 22  GLUCOSE 175*  --  162* 184*  BUN 11  --  7 8  CREATININE 0.89 0.75 0.72 0.72  CALCIUM 8.8*  --  8.2* 8.2*  MG  --  1.9  --  2.1  PHOS  --  4.2  --   --    GFR: Estimated Creatinine Clearance: 87.9 mL/min (by C-G formula based on SCr of 0.72 mg/dL). Liver Function Tests: Recent Labs  Lab 04/03/19 1706  AST 11*  ALT 11  ALKPHOS 89  BILITOT 0.5  PROT 7.8  ALBUMIN 2.6*   No results for input(s): LIPASE, AMYLASE in the last 168 hours. No results for input(s): AMMONIA in the last 168 hours. Coagulation Profile: Recent Labs  Lab 04/03/19 1706  INR 1.2   Cardiac Enzymes: No results for input(s): CKTOTAL, CKMB, CKMBINDEX, TROPONINI in the last 168 hours. BNP (last 3 results) No results for input(s): PROBNP in the last 8760 hours. HbA1C: Recent Labs    04/04/19 1802  HGBA1C 8.0*   CBG: Recent Labs  Lab 04/03/19 1736 04/03/19 2153 04/04/19 1809 04/04/19 2249 04/05/19 0808  GLUCAP 179* 191* 212* 179* 155*   Lipid Profile: No results  for input(s): CHOL, HDL, LDLCALC, TRIG, CHOLHDL, LDLDIRECT in the last 72 hours. Thyroid Function Tests: Recent Labs    04/04/19 1802  TSH 0.376   Anemia Panel: Recent Labs    04/04/19 1802  VITAMINB12 298  FERRITIN 156  TIBC 178*  IRON 6*   Sepsis Labs: Recent Labs  Lab 04/03/19 1706 04/03/19 2043  LATICACIDVEN 2.2* 0.9    Recent Results (from the past 240 hour(s))  Culture, blood (Routine x 2)     Status: Abnormal (Preliminary result)   Collection Time: 04/03/19  3:30 PM   Specimen: BLOOD  Result Value Ref Range Status   Specimen Description BLOOD RIGHT ANTECUBITAL  Final   Special Requests   Final    BOTTLES DRAWN AEROBIC AND ANAEROBIC Blood Culture results may not  be optimal due to an inadequate volume of blood received in culture bottles   Culture  Setup Time   Final    IN BOTH AEROBIC AND ANAEROBIC BOTTLES GRAM POSITIVE COCCI Organism ID to follow CRITICAL RESULT CALLED TO, READ BACK BY AND VERIFIED WITH: PHARMD T BAUMEISTER [id number] MLM    Culture (A)  Final    STAPHYLOCOCCUS AUREUS SUSCEPTIBILITIES TO FOLLOW Performed at [facility], [address] [place], [place] [address]    Report Status PENDING  Incomplete  Blood Culture ID Panel (Reflexed)     Status: Abnormal   Collection Time: 04/03/19  3:30 PM  Result Value Ref Range Status   Enterococcus species NOT DETECTED NOT DETECTED Final   Listeria monocytogenes NOT DETECTED NOT DETECTED Final   Staphylococcus species DETECTED (A) NOT DETECTED Final    Comment: CRITICAL RESULT CALLED TO, READ BACK BY AND VERIFIED WITH: PHARMD T BAUMEISTER [id number] MLM    Staphylococcus aureus (BCID) DETECTED (A) NOT DETECTED Final    Comment: Methicillin (oxacillin)-resistant Staphylococcus aureus (MRSA). MRSA is predictably resistant to beta-lactam antibiotics (except ceftaroline). Preferred therapy is vancomycin unless clinically contraindicated. Patient requires contact precautions if  hospitalized. CRITICAL RESULT CALLED TO, READ BACK BY AND VERIFIED WITH: PHARMD T BAUMEISTER [id number]    Methicillin resistance DETECTED (A) NOT DETECTED Final    Comment: CRITICAL RESULT CALLED TO, READ BACK BY AND VERIFIED WITH: PHARMD T BAUMEISTER [id number] MLM    Streptococcus species NOT DETECTED NOT DETECTED Final   Streptococcus agalactiae NOT DETECTED NOT DETECTED Final   Streptococcus pneumoniae NOT DETECTED NOT DETECTED Final   Streptococcus pyogenes NOT DETECTED NOT DETECTED Final   Acinetobacter baumannii NOT DETECTED NOT DETECTED Final   Enterobacteriaceae species NOT DETECTED NOT DETECTED Final   Enterobacter cloacae complex NOT DETECTED NOT DETECTED Final   Escherichia coli NOT  DETECTED NOT DETECTED Final   Klebsiella oxytoca NOT DETECTED NOT DETECTED Final   Klebsiella pneumoniae NOT DETECTED NOT DETECTED Final   Proteus species NOT DETECTED NOT DETECTED Final   Serratia marcescens NOT DETECTED NOT DETECTED Final   Haemophilus influenzae NOT DETECTED NOT DETECTED Final   Neisseria meningitidis NOT DETECTED NOT DETECTED Final   Pseudomonas aeruginosa NOT DETECTED NOT DETECTED Final   Candida albicans NOT DETECTED NOT DETECTED Final   Candida glabrata NOT DETECTED NOT DETECTED Final   Candida krusei NOT DETECTED NOT DETECTED Final   Candida parapsilosis NOT DETECTED NOT DETECTED Final   Candida tropicalis NOT DETECTED NOT DETECTED Final    Comment: Performed at [facility], [place]. [address], [place], [place] [address]  Culture, blood (Routine x 2)     Status:  None (Preliminary result)   Collection Time: 04/03/19  5:06 PM   Specimen: BLOOD RIGHT FOREARM  Result Value Ref Range Status   Specimen Description BLOOD RIGHT FOREARM  Final   Special Requests   Final    BOTTLES DRAWN AEROBIC AND ANAEROBIC Blood Culture results may not be optimal due to an inadequate volume of blood received in culture bottles   Culture  Setup Time   Final    GRAM POSITIVE COCCI IN BOTH AEROBIC AND ANAEROBIC BOTTLES CRITICAL VALUE NOTED.  VALUE IS CONSISTENT WITH PREVIOUSLY REPORTED AND CALLED VALUE. Performed at [facility], [place] [address] [place], [place] [address]    Culture GRAM POSITIVE COCCI  Final   Report Status PENDING  Incomplete  Urine culture     Status: Abnormal (Preliminary result)   Collection Time: 04/03/19  5:14 PM   Specimen: Urine, Random  Result Value Ref Range Status   Specimen Description URINE, RANDOM  Final   Special Requests NONE  Final   Culture (A)  Final    >=100,000 COLONIES/mL GRAM NEGATIVE RODS >=100,000 COLONIES/mL PROTEUS MIRABILIS SUSCEPTIBILITIES TO FOLLOW Performed at [facility], [place] [address] [place], [place] [address]     Report Status PENDING  Incomplete  SARS Coronavirus 2 [facility] order, Performed in [facility] hospital lab) Nasopharyngeal Nasopharyngeal Swab     Status: None   Collection Time: 04/03/19  5:34 PM   Specimen: Nasopharyngeal Swab  Result Value Ref Range Status   SARS Coronavirus 2 NEGATIVE NEGATIVE Final    Comment: (NOTE) If result is NEGATIVE SARS-CoV-2 target nucleic acids are NOT DETECTED. The SARS-CoV-2 RNA is generally detectable in upper and lower  respiratory specimens during the acute phase of infection. The lowest  concentration of SARS-CoV-2 viral copies this assay can detect is 250  copies / mL. A negative result does not preclude SARS-CoV-2 infection  and should not be used as the sole basis for treatment or other  patient management decisions.  A negative result may occur with  improper specimen collection / handling, submission of specimen other  than nasopharyngeal swab, presence of viral mutation(s) within the  areas targeted by this assay, and inadequate number of viral copies  (<250 copies / mL). A negative result must be combined with clinical  observations, patient history, and epidemiological information. If result is POSITIVE SARS-CoV-2 target nucleic acids are DETECTED. The SARS-CoV-2 RNA is generally detectable in upper and lower  respiratory specimens dur ing the acute phase of infection.  Positive  results are indicative of active infection with SARS-CoV-2.  Clinical  correlation with patient history and other diagnostic information is  necessary to determine patient infection status.  Positive results do  not rule out bacterial infection or co-infection with other viruses. If result is PRESUMPTIVE POSTIVE SARS-CoV-2 nucleic acids MAY BE PRESENT.   A presumptive positive result was obtained on the submitted specimen  and confirmed on repeat testing.  While 2019 novel coronavirus  (SARS-CoV-2) nucleic acids may be present in the submitted sample   additional confirmatory testing may be necessary for epidemiological  and / or clinical management purposes  to differentiate between  SARS-CoV-2 and other Sarbecovirus currently known to infect humans.  If clinically indicated additional testing with an alternate test  methodology [phone]) is advised. The SARS-CoV-2 RNA is generally  detectable in upper and lower respiratory sp ecimens during the acute  phase of infection. The expected result is Negative. Fact Sheet for Patients:  StrictlyIdeas.no  Fact Sheet for Healthcare Providers: BankingDealers.co.za This test is not yet approved or cleared by the [place] FDA and has been authorized for detection and/or diagnosis of SARS-CoV-2 by FDA under an Emergency Use Authorization (EUA).  This EUA will remain in effect (meaning this test can be used) for the duration of the COVID-19 declaration under Section 564(b)(1) of the Act, 21 U.S.C. section 360bbb-3(b)(1), unless the authorization is terminated or revoked sooner. Performed at [facility], [place] [address] [place], [place] [address]   MRSA PCR Screening     Status: Abnormal   Collection Time: 04/03/19  9:45 PM   Specimen: Nasal Mucosa; Nasopharyngeal  Result Value Ref Range Status   MRSA by PCR POSITIVE (A) NEGATIVE Final    Comment:        The GeneXpert MRSA Assay (FDA approved for NASAL specimens only), is one component of a comprehensive MRSA colonization surveillance program. It is not intended to diagnose MRSA infection nor to guide or monitor treatment for MRSA infections. RESULT CALLED TO, READ BACK BY AND VERIFIED WITH: Donalda Ewings RN 04/03/19 2333 JDW Performed at [facility] [address] [place], [place] [address]          Radiology Studies: Dg Chest 2 View  Result Date: 04/03/2019 CLINICAL DATA:  Chest pain and shortness of breath EXAM: CHEST - 2 VIEW COMPARISON:  May 13, 2017  FINDINGS: The heart size and mediastinal contours are within normal limits. The aorta is tortuous. There is minimal right pleural effusion. There is no focal infiltrate or pulmonary edema. No acute abnormalities identified in the bony structures. IMPRESSION: No focal pneumonia. Minimal right pleural effusion. Electronically Signed   By: Abelardo Diesel M.D.   On: 04/03/2019 16:13        Scheduled Meds: . baclofen  40 mg Oral TID  . busPIRone  10 mg Oral TID  . Chlorhexidine Gluconate Cloth  6 each Topical Q0600  . enoxaparin (LOVENOX) injection  70 mg Subcutaneous Q12H  . FLUoxetine  40 mg Oral Daily  . insulin aspart  0-15 Units Subcutaneous TID WC  . insulin aspart  0-5 Units Subcutaneous QHS  . insulin aspart  4 Units Subcutaneous TID WC  . insulin glargine  15 Units Subcutaneous Daily  . linaclotide  145 mcg Oral QAC breakfast  . mupirocin ointment  1 application Nasal BID  . mupirocin ointment   Topical Daily  . oxyCODONE-acetaminophen  1 tablet Oral Once  . traZODone  150 mg Oral QHS   Continuous Infusions: . sodium chloride 125 mL/hr at 04/05/19 0850  . vancomycin 1,250 mg (04/04/19 1819)     LOS: 2 days   Time spent= 40 mins     Arsenio Loader, MD [facility]  If 7PM-7AM, please contact night-coverage [email or web address] 04/05/2019, 11:22 AM

## 2019-04-05 NOTE — Progress Notes (Signed)
  Echocardiogram 2D Echocardiogram has been performed.  Clarence Sims 04/05/2019, 11:46 AM

## 2019-04-05 NOTE — Progress Notes (Signed)
CRITICAL VALUE ALERT  Critical Value:  Hemoglobin 6.7  Date & Time Notied:  04/05/2019 , 08:39  Provider Notified: MD. Reesa Chew   Orders Received/Actions taken:

## 2019-04-06 ENCOUNTER — Inpatient Hospital Stay (HOSPITAL_COMMUNITY): Payer: Medicaid Other

## 2019-04-06 LAB — CBC
HCT: 26.1 % — ABNORMAL LOW (ref 39.0–52.0)
Hemoglobin: 7.7 g/dL — ABNORMAL LOW (ref 13.0–17.0)
MCH: 21 pg — ABNORMAL LOW (ref 26.0–34.0)
MCHC: 29.5 g/dL — ABNORMAL LOW (ref 30.0–36.0)
MCV: 71.1 fL — ABNORMAL LOW (ref 80.0–100.0)
Platelets: 375 10*3/uL (ref 150–400)
RBC: 3.67 MIL/uL — ABNORMAL LOW (ref 4.22–5.81)
RDW: 20.7 % — ABNORMAL HIGH (ref 11.5–15.5)
WBC: 11.5 10*3/uL — ABNORMAL HIGH (ref 4.0–10.5)
nRBC: 0.2 % (ref 0.0–0.2)

## 2019-04-06 LAB — GLUCOSE, CAPILLARY
Glucose-Capillary: 160 mg/dL — ABNORMAL HIGH (ref 70–99)
Glucose-Capillary: 165 mg/dL — ABNORMAL HIGH (ref 70–99)
Glucose-Capillary: 214 mg/dL — ABNORMAL HIGH (ref 70–99)
Glucose-Capillary: 224 mg/dL — ABNORMAL HIGH (ref 70–99)

## 2019-04-06 LAB — RAPID URINE DRUG SCREEN, HOSP PERFORMED
Amphetamines: NOT DETECTED
Barbiturates: NOT DETECTED
Benzodiazepines: NOT DETECTED
Cocaine: NOT DETECTED
Opiates: POSITIVE — AB
Tetrahydrocannabinol: NOT DETECTED

## 2019-04-06 LAB — BASIC METABOLIC PANEL
Anion gap: 8 (ref 5–15)
BUN: 9 mg/dL (ref 6–20)
CO2: 22 mmol/L (ref 22–32)
Calcium: 8 mg/dL — ABNORMAL LOW (ref 8.9–10.3)
Chloride: 106 mmol/L (ref 98–111)
Creatinine, Ser: 0.88 mg/dL (ref 0.61–1.24)
GFR calc Af Amer: >60 mL/min (ref >60)
GFR calc non Af Amer: >60 mL/min (ref >60)
Glucose, Bld: 190 mg/dL — ABNORMAL HIGH (ref 70–99)
Potassium: 4.2 mmol/L (ref 3.5–5.1)
Sodium: 136 mmol/L (ref 135–145)

## 2019-04-06 LAB — CULTURE, BLOOD (ROUTINE X 2)

## 2019-04-06 LAB — URINE CULTURE: Culture: >=100000 — AB

## 2019-04-06 LAB — TROPONIN I (HIGH SENSITIVITY): Troponin I (High Sensitivity): 6 ng/L (ref <18)

## 2019-04-06 LAB — MAGNESIUM: Magnesium: 2 mg/dL (ref 1.7–2.4)

## 2019-04-06 LAB — PROCALCITONIN: Procalcitonin: 0.17 ng/mL

## 2019-04-06 LAB — BRAIN NATRIURETIC PEPTIDE: B Natriuretic Peptide: 88.6 pg/mL (ref 0.0–100.0)

## 2019-04-06 MED ORDER — ENOXAPARIN SODIUM 80 MG/0.8ML ~~LOC~~ SOLN
70.0000 mg | Freq: Two times a day (BID) | SUBCUTANEOUS | Status: DC
  Administered 2019-04-08 – 2019-04-10 (×4): 70 mg via SUBCUTANEOUS
  Filled 2019-04-06 (×4): qty 0.8

## 2019-04-06 MED ORDER — LORAZEPAM 2 MG/ML IJ SOLN
1.0000 mg | INTRAMUSCULAR | Status: DC | PRN
  Administered 2019-04-06 – 2019-04-10 (×4): 1 mg via INTRAVENOUS
  Filled 2019-04-06 (×4): qty 1

## 2019-04-06 MED ORDER — DILTIAZEM HCL 60 MG PO TABS
60.0000 mg | ORAL_TABLET | Freq: Three times a day (TID) | ORAL | Status: DC
  Administered 2019-04-06 – 2019-04-12 (×17): 60 mg via ORAL
  Filled 2019-04-06 (×18): qty 1

## 2019-04-06 MED ORDER — ACETAMINOPHEN 325 MG PO TABS: 650.0000 mg | ORAL_TABLET | Freq: Four times a day (QID) | ORAL | Status: DC | PRN

## 2019-04-06 MED ORDER — BISACODYL 10 MG RE SUPP
10.0000 mg | Freq: Once | RECTAL | Status: AC
  Administered 2019-04-06: 10 mg via RECTAL
  Filled 2019-04-06: qty 1

## 2019-04-06 MED ORDER — OXYCODONE-ACETAMINOPHEN 5-325 MG PO TABS
1.0000 | ORAL_TABLET | Freq: Four times a day (QID) | ORAL | Status: DC | PRN
  Administered 2019-04-06 – 2019-04-07 (×2): 1 via ORAL
  Administered 2019-04-07: 2 via ORAL
  Administered 2019-04-08 (×2): 1 via ORAL
  Administered 2019-04-08 – 2019-04-11 (×10): 2 via ORAL
  Administered 2019-04-11: 1 via ORAL
  Administered 2019-04-11 – 2019-04-12 (×3): 2 via ORAL
  Filled 2019-04-06 (×10): qty 2
  Filled 2019-04-06: qty 1
  Filled 2019-04-06 (×6): qty 2
  Filled 2019-04-06: qty 1
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
## 2019-04-06 NOTE — Progress Notes (Signed)
Patient stated to this nurse that if he did not receive more pain medication, or a higher dosage, that he would leave AMA.  MD aware of patient's affect / mood.

## 2019-04-06 NOTE — Progress Notes (Signed)
Pharmacy Antibiotic Note  Clarence Sims is a 58 y.o. male admitted on 04/03/2019 with presenting with sepsis due to MRSA bacteriemia. Pharmacy has been consulted for Vancomycin dosing for sepsis. Patient has history of MRSA skin infections and infections in his spine. Multiple sores on abdomen and back that are open.    WBC 15.4, Tmax 101.1, lactate trending down   Plan: Continue Vancomycin 1250 mg IV Q 24 hrs. Goal AUC 400-550. Expected AUC:  503 SCr used: 0.89 Monitor clinical progress, renal function. Check steady state vancomycin peak and trough per protocol if needed.    Height: 5\' 2"  (157.5 cm) Weight: 151 lb 14.4 oz (68.9 kg) IBW/kg (Calculated) : 54.6  Temp (24hrs), Avg:99.9 F (37.7 C), Min:98.5 F (36.9 C), Max:101.1 F (38.4 C)  Recent Labs  Lab 04/03/19 1706 04/03/19 2043 04/03/19 2252 04/04/19 0257 04/04/19 0605 04/05/19 0726 04/06/19 0210  WBC 15.4*  --  16.7*  --  13.8* 9.8 11.5*  CREATININE 0.89  --  0.75 0.72  --  0.72 0.88  LATICACIDVEN 2.2* 0.9  --   --   --   --   --     Estimated Creatinine Clearance: 79.9 mL/min (by C-G formula based on SCr of 0.88 mg/dL).    Allergies  Allergen Reactions  . Novocain [Procaine] Other (See Comments)    Unknown reaction  . Vancomycin Other (See Comments)    Nephrotoxicity--discontinued at OSH and changed to Dapto - per [facility] 2013    Antimicrobials this admission: Cefepime 8/20 x1 Ceftriaxone 8/20>> 8/21 Vanc 8/20 >>   Microbiology results: 8/20: MRSA PCR: positive 8/20 Covid: negative 8/20 BCx: MRSA 1/4 bottles  8/20 Ucx: 100K Klebsiella, 100K proteus   Thank you for allowing pharmacy to be a part of this patient's care.  Acey Lav, PharmD  PGY1 Acute Care Pharmacy Resident [phone] 04/06/2019 1:32 PM

## 2019-04-06 NOTE — Progress Notes (Signed)
PROGRESS NOTE    Clarence Sims  [id number] DOB: 03-14-1961 DOA: 04/03/2019 PCP: Nicoletta Dress, MD   Brief Narrative:  58 year old with history of paraplegia following spinal cord injury/infarction, remote infection, chronic low back pain, history of osteomyelitis, pressure ulcers, prostate cancer came to the hospital with complains of overall weakness for the past week along with fevers and chills.  He is also had chronic indwelling Foley catheter which was changed about 2 weeks ago.  Upon hospitalization noted to be in urosepsis and evidence of infected sebaceous cyst on his back.  He was started on IV vancomycin and Rocephin.   Assessment & Plan:   Principal Problem:   MRSA bacteremia Active Problems:   Paraplegia following spinal cord injury ([place])   Neurogenic bladder   Sacral decubitus ulcer, stage IV (H[facility])   Microcytic anemia   Hx of discitis   Polysubstance abuse ([facility])   Chronic pain syndrome  MRSA bacteremia Sepsis secondary to complicated MRSA bactremia Chronic indwelling Foley catheter Mid back sebaceous cyst, infected with drainage Epidural and Paraspinal Abscess - Blood cultures growing gram-positive cocci.  Continue IV vancomycin.  Appreciate input from Dr. Glenford Peers with him this morning on 8/22.  For now it appears his urine culture-gram-negative rods and Proteus are chronic colonizer.  We will monitor this clinically. - Lactate has trended down -Foley changed this admission -Blood cultures growing MRSA/gram-positive. -Echocardiogram- ejection fraction 50 and 55% -Routine wound care for his sebaceous cyst. -Wound care team following. -MRI spine -shows vertebral osteo, epidural and paraspinal abscess.  Discussed with IR, n.p.o. past midnight for drain tomorrow.  Episode of seizure, overnight -EEG, MRI brain.  Neurochecks.   Sinus tachycardia -History of cocaine use, will use Cardizem 60 mg every 8 hours.  Hyponatremia secondary to  intravascular volume depletion -Getting IV fluids.  Monitor sodium levels.  History of paraplegia with neurogenic bladder with chronic indwelling Foley -Foley to be changed out in the hospital.  Supportive care  Anemia of chronic disease and microcytic -Status post 1 unit PRBC transfusion 8/22.  Hemoglobin stable 7.7 - Low iron saturation, borderline low ferritin.  iron supplements with bowel regimen  History of hepatitis C - Unknown if this was treated in the past.  History of chronic pain and illicit drug use/substance use -Pain medicines with bowel regimen  Diabetes mellitus type 2 -Insulin sliding scale Accu-Chek. -Currently awaiting outpatient medication list so can be started on appropriate meds  Bipolar disorder -Awaiting outpatient med list  Patient has had previous history of vertebral osteomyelitis/epidural abscess with MRSA bacteremia. Wife tells me patient had CVA back in 2012 and then was switched over to Eliquis by primary care provider Dr. Jola Schmidt.   Wife also tells me he has had previous drug related seizure when he used to use cocaine.  Was on antiepileptics about 12 years ago but was then taken off.  His last episode was about 2 years ago.  He has not taken antiepileptics in many years and does not follow with a neurologist.   DVT prophylaxis: Therapeutic dose of Lovenox Code Status: Full code Family Communication: Spoke with patient's wife Diane Disposition Plan: Maintain hospital stay for treatment for bacteremia  Consultants:   None  Procedures:   None  Antimicrobials:   Vancomycin    Subjective: According to records it appears patient had episode of seizure last night but he was somewhat responsive followed by some confusion.  This morning he is awake alert oriented, wanting to leave [place].  He wants [place] to keep on increasing his pain medications without clear new indication.  He knows he is already on his home regimen of Percocet  additionally he is on IV morphine for more pain control.  Otherwise he appears comfortable.  Spoke with his wife regarding this as well-wife is concerned that he tends to abuse pain medications and exaggerates his pain.  She also tells me patient has had drug-related seizures many years ago but not over last 2 years.  He has not been on antiepileptics for many years.  Review of Systems Otherwise negative except as per HPI, including: General = no fevers, chills, dizziness, malaise, fatigue HEENT/EYES = negative for pain, redness, loss of vision, double vision, blurred vision, loss of hearing, sore throat, hoarseness, dysphagia Cardiovascular= negative for chest pain, palpitation, murmurs, lower extremity swelling Respiratory/lungs= negative for shortness of breath, cough, hemoptysis, wheezing, mucus production Gastrointestinal= negative for nausea, vomiting,, abdominal pain, melena, hematemesis Genitourinary= negative for Dysuria, Hematuria, Change in Urinary Frequency MSK = Negative for arthralgia, myalgias, Back Pain, Joint swelling  Neurology= Negative for headache, seizures, numbness, tingling  Psychiatry= Negative for anxiety, depression, suicidal and homocidal ideation Allergy/Immunology= Medication/Food allergy as listed  Skin= Negative for Rash, lesions, ulcers, itching  Objective: Vitals:   04/06/19 0500 04/06/19 0600 04/06/19 0726 04/06/19 0818  BP:   137/76 104/69  Pulse: (!) 101  (!) 107 (!) 106  Resp: (!) 31  (!) 28 19  Temp:  99.9 F (37.7 C) 98.8 F (37.1 C) 99.1 F (37.3 C)  TempSrc:  Oral Oral Oral  SpO2: 93%  94% 97%  Weight:      Height:        Intake/Output Summary (Last 24 hours) at 04/06/2019 1128 Last data filed at 04/06/2019 0646 Gross per 24 hour  Intake 3122.5 ml  Output 4200 ml  Net -1077.5 ml   Filed Weights   04/03/19 1642 04/04/19 0600  Weight: 68 kg 68.9 kg    Examination:  Constitutional: NAD, calm, comfortable Eyes: PERRL, lids and  conjunctivae normal ENMT: Mucous membranes are moist. Posterior pharynx clear of any exudate or lesions.Normal dentition.  Neck: normal, supple, no masses, no thyromegaly Respiratory: clear to auscultation bilaterally, no wheezing, no crackles. Normal respiratory effort. No accessory muscle use.  Cardiovascular: Regular rate and rhythm, no murmurs / rubs / gallops. No extremity edema. 2+ pedal pulses. No carotid bruits.  Abdomen: no tenderness, no masses palpated. No hepatosplenomegaly. Bowel sounds positive.  Musculoskeletal: no clubbing / cyanosis. No joint deformity upper and lower extremities. Good ROM, no contractures. Normal muscle tone.  Skin: Cyst with dressing noted in his back Neurologic: CN 2-12 grossly intact. Sensation intact, DTR normal. Strength 4/5 in all 4.  Psychiatric: Very agitated and aggressive towards staff  Chronic indwelling Foley  Data Reviewed:   CBC: Recent Labs  Lab 04/03/19 1706 04/03/19 2252 04/04/19 0605 04/05/19 0726 04/06/19 0210  WBC 15.4* 16.7* 13.8* 9.8 11.5*  NEUTROABS 13.1*  --   --   --   --   HGB 8.5* 7.4* 7.2* 6.7* 7.7*  HCT 30.3* 25.5* 25.0* 23.9* 26.1*  MCV 69.5* 67.1* 68.7* 69.5* 71.1*  PLT 511* 499* 452* 413* [id number]   Basic Metabolic Panel: Recent Labs  Lab 04/03/19 1706 04/03/19 2252 04/04/19 0257 04/05/19 0726 04/06/19 0210  NA 132*  --  133* 137 136  K 4.6  --  4.3 4.0 4.2  CL 96*  --  101 106 106  CO2 22  --  21* 22 22  GLUCOSE 175*  --  162* 184* 190*  BUN 11  --  7 8 9   CREATININE 0.89 0.75 0.72 0.72 0.88  CALCIUM 8.8*  --  8.2* 8.2* 8.0*  MG  --  1.9  --  2.1 2.0  PHOS  --  4.2  --   --   --    GFR: Estimated Creatinine Clearance: 79.9 mL/min (by C-G formula based on SCr of 0.88 mg/dL). Liver Function Tests: Recent Labs  Lab 04/03/19 1706  AST 11*  ALT 11  ALKPHOS 89  BILITOT 0.5  PROT 7.8  ALBUMIN 2.6*   No results for input(s): LIPASE, AMYLASE in the last 168 hours. No results for input(s): AMMONIA in  the last 168 hours. Coagulation Profile: Recent Labs  Lab 04/03/19 1706  INR 1.2   Cardiac Enzymes: No results for input(s): CKTOTAL, CKMB, CKMBINDEX, TROPONINI in the last 168 hours. BNP (last 3 results) No results for input(s): PROBNP in the last 8760 hours. HbA1C: Recent Labs    04/04/19 1802  HGBA1C 8.0*   CBG: Recent Labs  Lab 04/05/19 0808 04/05/19 1206 04/05/19 1652 04/05/19 2204 04/06/19 0750  GLUCAP 155* 117* 180* 223* 160*   Lipid Profile: No results for input(s): CHOL, HDL, LDLCALC, TRIG, CHOLHDL, LDLDIRECT in the last 72 hours. Thyroid Function Tests: Recent Labs    04/04/19 1802  TSH 0.376   Anemia Panel: Recent Labs    04/04/19 1802  VITAMINB12 298  FERRITIN 156  TIBC 178*  IRON 6*   Sepsis Labs: Recent Labs  Lab 04/03/19 1706 04/03/19 2043 04/06/19 0210  PROCALCITON  --   --  0.17  LATICACIDVEN 2.2* 0.9  --     Recent Results (from the past 240 hour(s))  Culture, blood (Routine x 2)     Status: Abnormal   Collection Time: 04/03/19  3:30 PM   Specimen: BLOOD  Result Value Ref Range Status   Specimen Description BLOOD RIGHT ANTECUBITAL  Final   Special Requests   Final    BOTTLES DRAWN AEROBIC AND ANAEROBIC Blood Culture results may not be optimal due to an inadequate volume of blood received in culture bottles   Culture  Setup Time   Final    IN BOTH AEROBIC AND ANAEROBIC BOTTLES GRAM POSITIVE COCCI Organism ID to follow CRITICAL RESULT CALLED TO, READ BACK BY AND VERIFIED WITH: Milford Cage [id number] MLM Performed at [facility], [address] [place], [place] [address]    Culture METHICILLIN RESISTANT STAPHYLOCOCCUS AUREUS (A)  Final   Report Status 04/06/2019 FINAL  Final   Organism ID, Bacteria METHICILLIN RESISTANT STAPHYLOCOCCUS AUREUS  Final      Susceptibility   Methicillin resistant staphylococcus aureus - MIC*    CIPROFLOXACIN >=8 RESISTANT Resistant     ERYTHROMYCIN >=8 RESISTANT Resistant      GENTAMICIN <=0.5 SENSITIVE Sensitive     OXACILLIN >=4 RESISTANT Resistant     TETRACYCLINE >=16 RESISTANT Resistant     VANCOMYCIN 1 SENSITIVE Sensitive     TRIMETH/SULFA <=10 SENSITIVE Sensitive     CLINDAMYCIN >=8 RESISTANT Resistant     RIFAMPIN <=0.5 SENSITIVE Sensitive     Inducible Clindamycin NEGATIVE Sensitive     * METHICILLIN RESISTANT STAPHYLOCOCCUS AUREUS  Blood Culture ID Panel (Reflexed)     Status: Abnormal   Collection Time: 04/03/19  3:30 PM  Result Value Ref Range Status   Enterococcus species NOT DETECTED NOT DETECTED Final   Listeria  monocytogenes NOT DETECTED NOT DETECTED Final   Staphylococcus species DETECTED (A) NOT DETECTED Final    Comment: CRITICAL RESULT CALLED TO, READ BACK BY AND VERIFIED WITH: PHARMD T BAUMEISTER [id number] MLM    Staphylococcus aureus (BCID) DETECTED (A) NOT DETECTED Final    Comment: Methicillin (oxacillin)-resistant Staphylococcus aureus (MRSA). MRSA is predictably resistant to beta-lactam antibiotics (except ceftaroline). Preferred therapy is vancomycin unless clinically contraindicated. Patient requires contact precautions if  hospitalized. CRITICAL RESULT CALLED TO, READ BACK BY AND VERIFIED WITH: PHARMD T BAUMEISTER [id number] MLM    Methicillin resistance DETECTED (A) NOT DETECTED Final    Comment: CRITICAL RESULT CALLED TO, READ BACK BY AND VERIFIED WITH: PHARMD T BAUMEISTER [id number] MLM    Streptococcus species NOT DETECTED NOT DETECTED Final   Streptococcus agalactiae NOT DETECTED NOT DETECTED Final   Streptococcus pneumoniae NOT DETECTED NOT DETECTED Final   Streptococcus pyogenes NOT DETECTED NOT DETECTED Final   Acinetobacter baumannii NOT DETECTED NOT DETECTED Final   Enterobacteriaceae species NOT DETECTED NOT DETECTED Final   Enterobacter cloacae complex NOT DETECTED NOT DETECTED Final   Escherichia coli NOT DETECTED NOT DETECTED Final   Klebsiella oxytoca NOT DETECTED NOT DETECTED Final   Klebsiella  pneumoniae NOT DETECTED NOT DETECTED Final   Proteus species NOT DETECTED NOT DETECTED Final   Serratia marcescens NOT DETECTED NOT DETECTED Final   Haemophilus influenzae NOT DETECTED NOT DETECTED Final   Neisseria meningitidis NOT DETECTED NOT DETECTED Final   Pseudomonas aeruginosa NOT DETECTED NOT DETECTED Final   Candida albicans NOT DETECTED NOT DETECTED Final   Candida glabrata NOT DETECTED NOT DETECTED Final   Candida krusei NOT DETECTED NOT DETECTED Final   Candida parapsilosis NOT DETECTED NOT DETECTED Final   Candida tropicalis NOT DETECTED NOT DETECTED Final    Comment: Performed at [facility], [place]. [address] [place], [place] [address]  Culture, blood (Routine x 2)     Status: Abnormal   Collection Time: 04/03/19  5:06 PM   Specimen: BLOOD RIGHT FOREARM  Result Value Ref Range Status   Specimen Description BLOOD RIGHT FOREARM  Final   Special Requests   Final    BOTTLES DRAWN AEROBIC AND ANAEROBIC Blood Culture results may not be optimal due to an inadequate volume of blood received in culture bottles   Culture  Setup Time   Final    GRAM POSITIVE COCCI IN BOTH AEROBIC AND ANAEROBIC BOTTLES CRITICAL VALUE NOTED.  VALUE IS CONSISTENT WITH PREVIOUSLY REPORTED AND CALLED VALUE.    Culture (A)  Final    STAPHYLOCOCCUS AUREUS SUSCEPTIBILITIES PERFORMED ON PREVIOUS CULTURE WITHIN THE LAST 5 DAYS. Performed at [facility], [place] [address] [place], [place] [address]    Report Status 04/06/2019 FINAL  Final  Urine culture     Status: Abnormal   Collection Time: 04/03/19  5:14 PM   Specimen: Urine, Random  Result Value Ref Range Status   Specimen Description URINE, RANDOM  Final   Special Requests   Final    NONE Performed at [facility], [place] [address], [place], [place] [address]    Culture (A)  Final    >=100,000 COLONIES/mL KLEBSIELLA PNEUMONIAE >=100,000 COLONIES/mL PROTEUS MIRABILIS    Report Status 04/06/2019 FINAL  Final   Organism ID,  Bacteria KLEBSIELLA PNEUMONIAE (A)  Final   Organism ID, Bacteria PROTEUS MIRABILIS (A)  Final      Susceptibility   Klebsiella pneumoniae - MIC*    AMPICILLIN >=  32 RESISTANT Resistant     CEFAZOLIN <=4 SENSITIVE Sensitive     CEFTRIAXONE <=1 SENSITIVE Sensitive     CIPROFLOXACIN 1 SENSITIVE Sensitive     GENTAMICIN <=1 SENSITIVE Sensitive     IMIPENEM <=0.25 SENSITIVE Sensitive     NITROFURANTOIN 64 INTERMEDIATE Intermediate     TRIMETH/SULFA >=320 RESISTANT Resistant     AMPICILLIN/SULBACTAM 8 SENSITIVE Sensitive     PIP/TAZO 8 SENSITIVE Sensitive     Extended ESBL POSITIVE Resistant     * >=100,000 COLONIES/mL KLEBSIELLA PNEUMONIAE   Proteus mirabilis - MIC*    AMPICILLIN <=2 SENSITIVE Sensitive     CEFAZOLIN <=4 SENSITIVE Sensitive     CEFTRIAXONE <=1 SENSITIVE Sensitive     CIPROFLOXACIN <=0.25 SENSITIVE Sensitive     GENTAMICIN <=1 SENSITIVE Sensitive     IMIPENEM 2 SENSITIVE Sensitive     NITROFURANTOIN 128 RESISTANT Resistant     TRIMETH/SULFA <=20 SENSITIVE Sensitive     AMPICILLIN/SULBACTAM <=2 SENSITIVE Sensitive     PIP/TAZO <=4 SENSITIVE Sensitive     * >=100,000 COLONIES/mL PROTEUS MIRABILIS  SARS Coronavirus 2 [facility] order, Performed in [place] hospital lab) Nasopharyngeal Nasopharyngeal Swab     Status: None   Collection Time: 04/03/19  5:34 PM   Specimen: Nasopharyngeal Swab  Result Value Ref Range Status   SARS Coronavirus 2 NEGATIVE NEGATIVE Final    Comment: (NOTE) If result is NEGATIVE SARS-CoV-2 target nucleic acids are NOT DETECTED. The SARS-CoV-2 RNA is generally detectable in upper and lower  respiratory specimens during the acute phase of infection. The lowest  concentration of SARS-CoV-2 viral copies this assay can detect is 250  copies / mL. A negative result does not preclude SARS-CoV-2 infection  and should not be used as the sole basis for treatment or other  patient management decisions.  A negative result may occur with  improper  specimen collection / handling, submission of specimen other  than nasopharyngeal swab, presence of viral mutation(s) within the  areas targeted by this assay, and inadequate number of viral copies  (<250 copies / mL). A negative result must be combined with clinical  observations, patient history, and epidemiological information. If result is POSITIVE SARS-CoV-2 target nucleic acids are DETECTED. The SARS-CoV-2 RNA is generally detectable in upper and lower  respiratory specimens dur ing the acute phase of infection.  Positive  results are indicative of active infection with SARS-CoV-2.  Clinical  correlation with patient history and other diagnostic information is  necessary to determine patient infection status.  Positive results do  not rule out bacterial infection or co-infection with other viruses. If result is PRESUMPTIVE POSTIVE SARS-CoV-2 nucleic acids MAY BE PRESENT.   A presumptive positive result was obtained on the submitted specimen  and confirmed on repeat testing.  While 2019 novel coronavirus  (SARS-CoV-2) nucleic acids may be present in the submitted sample  additional confirmatory testing may be necessary for epidemiological  and / or clinical management purposes  to differentiate between  SARS-CoV-2 and other Sarbecovirus currently known to infect humans.  If clinically indicated additional testing with an alternate test  methodology [phone]) is advised. The SARS-CoV-2 RNA is generally  detectable in upper and lower respiratory sp ecimens during the acute  phase of infection. The expected result is Negative. Fact Sheet for Patients:  StrictlyIdeas.no Fact Sheet for Healthcare Providers: BankingDealers.co.za This test is not yet approved or cleared by the [place] FDA and has been authorized for detection and/or diagnosis of SARS-CoV-2 by  FDA under an Emergency Use Authorization (EUA).  This EUA will remain in  effect (meaning this test can be used) for the duration of the COVID-19 declaration under Section 564(b)(1) of the Act, 21 U.S.C. section 360bbb-3(b)(1), unless the authorization is terminated or revoked sooner. Performed at [facility], [place] [address] [place], [place] [address]   MRSA PCR Screening     Status: Abnormal   Collection Time: 04/03/19  9:45 PM   Specimen: Nasal Mucosa; Nasopharyngeal  Result Value Ref Range Status   MRSA by PCR POSITIVE (A) NEGATIVE Final    Comment:        The GeneXpert MRSA Assay (FDA approved for NASAL specimens only), is one component of a comprehensive MRSA colonization surveillance program. It is not intended to diagnose MRSA infection nor to guide or monitor treatment for MRSA infections. RESULT CALLED TO, READ BACK BY AND VERIFIED WITH: Donalda Ewings RN 04/03/19 2333 JDW Performed at [facility] [address] [place], [place] [address]   Culture, blood (routine x 2)     Status: None (Preliminary result)   Collection Time: 04/05/19  7:10 AM   Specimen: BLOOD  Result Value Ref Range Status   Specimen Description BLOOD RIGHT ANTECUBITAL  Final   Special Requests   Final    BOTTLES DRAWN AEROBIC ONLY Blood Culture adequate volume   Culture   Final    NO GROWTH < 24 HOURS Performed at [facility], [place] [address] [place], [place] [address]    Report Status PENDING  Incomplete  Culture, blood (routine x 2)     Status: None (Preliminary result)   Collection Time: 04/05/19  7:22 AM   Specimen: BLOOD  Result Value Ref Range Status   Specimen Description BLOOD RIGHT ANTECUBITAL  Final   Special Requests   Final    BOTTLES DRAWN AEROBIC ONLY Blood Culture adequate volume   Culture   Final    NO GROWTH < 24 HOURS Performed at [facility], [place] [address], [place], [place] [address]    Report Status PENDING  Incomplete         Radiology Studies: Mr Lumbar Spine W Wo Contrast  Result Date: 04/05/2019 CLINICAL  DATA:  MRSA bacteremia, paraplegia, spinal cord injury previously. Chronic low back pain. EXAM: MRI LUMBAR SPINE WITHOUT AND WITH CONTRAST TECHNIQUE: Multiplanar and multiecho pulse sequences of the lumbar spine were obtained without and with intravenous contrast. CONTRAST:  7 mL Gadavist COMPARISON:  None. FINDINGS: Segmentation:  Standard. Alignment:  Physiologic. Vertebrae:  No acute fracture.  No aggressive osseous lesion. Fluid signal within the L4-5 disc space with marrow edema on either side of the disc space within the vertebral bodies and peripheral disc enhancement most concerning for discitis-osteomyelitis. 1.7 x 4 x 3.7 cm complex peripherally enhancing fluid collection in the left paraspinal muscle with an adjacent 1.5 x 2 cm fluid collection in the left paraspinal muscle abutting the facet joint along the posterior margin. Similar 1.2 x 2.2 cm complex fluid collection with peripheral enhancement in the right paraspinal muscle adjacent to the spinous process. Epidural enhancement along the ventral and dorsal aspect with a small peripherally enhancing fluid collection along the dorsal aspect of the thecal sac measuring 1.2 x 0.8 cm at the level of L4 most concerning for a small abscess. Inflammatory changes and the abscess result in overall severe spinal stenosis. Conus medullaris and cauda equina: Conus extends to the L2 level. Conus and  cauda equina appear normal. Paraspinal and other soft tissues: No other paraspinal abnormality. Disc levels: Disc spaces: Degenerative disc disease with disc height loss at L5-S1 and L1-2. T12-L1: No significant disc bulge. No evidence of neural foraminal stenosis. No central canal stenosis. L1-L2: Mild broad-based disc bulge. Moderate bilateral facet arthropathy. No evidence of neural foraminal stenosis. No central canal stenosis. L2-L3: No significant disc bulge. No evidence of neural foraminal stenosis. No central canal stenosis. Mild bilateral facet arthropathy.  L3-L4: Mild broad-based disc bulge. Moderate bilateral facet arthropathy. Moderate spinal stenosis related to the epidural phlegmonous changes. No evidence of neural foraminal stenosis. L4-L5: No disc protrusion. Severe spinal stenosis resulting from the epidural phlegmonous changes and the dorsal epidural abscess measuring 1.2 x 0.8 cm. Moderate bilateral facet arthropathy. Moderate bilateral foraminal stenosis. L5-S1: Broad-based disc osteophyte complex. Severe bilateral scratch them moderate bilateral facet arthropathy. Moderate bilateral foraminal stenosis. IMPRESSION: 1. Discitis-osteomyelitis at L4-5 with epidural phlegmonous changes and a 1.2 x 0.8 cm epidural abscess along the dorsal aspect at the level of L4. Severe spinal stenosis at L4-5 resulting from the phlegmonous changes and facet arthropathy. Infectious myositis of the multifidus muscle posteriorly with right and left intramuscular abscesses. The largest intramuscular abscess in the left multifidus muscle measures 1.7 x 4 x 3.7 cm. 2. Lumbar spine spondylosis as described above. Electronically Signed   By: Kathreen Devoid   On: 04/05/2019 15:31        Scheduled Meds:  sodium chloride   Intravenous Once   baclofen  40 mg Oral TID   busPIRone  10 mg Oral TID   Chlorhexidine Gluconate Cloth  6 each Topical Q0600   enoxaparin (LOVENOX) injection  70 mg Subcutaneous Q12H   ferrous sulfate  325 mg Oral BID WC   FLUoxetine  40 mg Oral Daily   insulin aspart  0-15 Units Subcutaneous TID WC   insulin aspart  0-5 Units Subcutaneous QHS   insulin aspart  4 Units Subcutaneous TID WC   insulin glargine  15 Units Subcutaneous Daily   linaclotide  145 mcg Oral QAC breakfast   mupirocin ointment  1 application Nasal BID   mupirocin ointment   Topical Daily   oxyCODONE-acetaminophen  1 tablet Oral Once   traZODone  150 mg Oral QHS   Continuous Infusions:  sodium chloride 125 mL/hr at 04/06/19 0600   vancomycin Stopped  (04/05/19 1910)     LOS: 3 days   Time spent= 40 mins    Braelynn Lupton Arsenio Loader, MD [facility]  If 7PM-7AM, please contact night-coverage [email or web address] 04/06/2019, 11:28 AM

## 2019-04-06 NOTE — Progress Notes (Signed)
After responding to PT call bell, PT began having upper body tremors with eyes rolled back in head. PT responded after calling his name two times. He stated he was having chest pain again and thought he might have had a seizure. Bodenheimer, MD at bedside. 2 mg Ativan given. PT states he is supposed to take Phenobarbital at home, but has not been taking it. Will continue to monitor for seizure activity.

## 2019-04-06 NOTE — Progress Notes (Signed)
Attempted to bring pt down for MRI exam. Per transport pt does not want to come down now, wants to eat lunch. Transport told to let pt and RN know that is fine but cannot guarantee time for pt to come down at later date due to inability to predict STAT exam orders that may be ordered during lunch time.

## 2019-04-07 ENCOUNTER — Inpatient Hospital Stay (HOSPITAL_COMMUNITY): Payer: Medicaid Other

## 2019-04-07 ENCOUNTER — Inpatient Hospital Stay: Payer: Self-pay

## 2019-04-07 LAB — CBC
HCT: 21.1 % — ABNORMAL LOW (ref 39.0–52.0)
HCT: 28.9 % — ABNORMAL LOW (ref 39.0–52.0)
Hemoglobin: 6.5 g/dL — CL (ref 13.0–17.0)
Hemoglobin: 8.8 g/dL — ABNORMAL LOW (ref 13.0–17.0)
MCH: 20.9 pg — ABNORMAL LOW (ref 26.0–34.0)
MCH: 22.2 pg — ABNORMAL LOW (ref 26.0–34.0)
MCHC: 30.8 g/dL (ref 30.0–36.0)
MCHC: 30.4 g/dL (ref 30.0–36.0)
MCV: 67.8 fL — ABNORMAL LOW (ref 80.0–100.0)
MCV: 72.8 fL — ABNORMAL LOW (ref 80.0–100.0)
Platelets: 327 10*3/uL (ref 150–400)
Platelets: 409 10*3/uL — ABNORMAL HIGH (ref 150–400)
RBC: 3.11 MIL/uL — ABNORMAL LOW (ref 4.22–5.81)
RBC: 3.97 MIL/uL — ABNORMAL LOW (ref 4.22–5.81)
RDW: 20.7 % — ABNORMAL HIGH (ref 11.5–15.5)
RDW: 22.1 % — ABNORMAL HIGH (ref 11.5–15.5)
WBC: 8.8 10*3/uL (ref 4.0–10.5)
WBC: 10.4 10*3/uL (ref 4.0–10.5)
nRBC: 0.5 % — ABNORMAL HIGH (ref 0.0–0.2)
nRBC: 0 % (ref 0.0–0.2)

## 2019-04-07 LAB — CULTURE, BLOOD (ROUTINE X 2)

## 2019-04-07 LAB — GLUCOSE, CAPILLARY
Glucose-Capillary: 143 mg/dL — ABNORMAL HIGH (ref 70–99)
Glucose-Capillary: 138 mg/dL — ABNORMAL HIGH (ref 70–99)
Glucose-Capillary: 314 mg/dL — ABNORMAL HIGH (ref 70–99)

## 2019-04-07 LAB — BASIC METABOLIC PANEL
Anion gap: 9 (ref 5–15)
BUN: 8 mg/dL (ref 6–20)
CO2: 24 mmol/L (ref 22–32)
Calcium: 8.6 mg/dL — ABNORMAL LOW (ref 8.9–10.3)
Chloride: 105 mmol/L (ref 98–111)
Creatinine, Ser: 0.72 mg/dL (ref 0.61–1.24)
GFR calc Af Amer: >60 mL/min (ref >60)
GFR calc non Af Amer: >60 mL/min (ref >60)
Glucose, Bld: 164 mg/dL — ABNORMAL HIGH (ref 70–99)
Potassium: 4.3 mmol/L (ref 3.5–5.1)
Sodium: 138 mmol/L (ref 135–145)

## 2019-04-07 LAB — PREPARE RBC (CROSSMATCH)

## 2019-04-07 LAB — FOLATE RBC
Folate, Hemolysate: 320 ng/mL
Folate, RBC: 1296 ng/mL (ref >498)
Hematocrit: 24.7 % — ABNORMAL LOW (ref 37.5–51.0)

## 2019-04-07 LAB — MAGNESIUM: Magnesium: 1.9 mg/dL (ref 1.7–2.4)

## 2019-04-07 MED ORDER — FENTANYL CITRATE (PF) 100 MCG/2ML IJ SOLN
INTRAMUSCULAR | Status: AC | PRN
  Administered 2019-04-07: 50 ug via INTRAVENOUS

## 2019-04-07 MED ORDER — SODIUM CHLORIDE 0.9% FLUSH: 10.0000 mL | INTRAVENOUS | Status: DC | PRN

## 2019-04-07 MED ORDER — MIDAZOLAM HCL 2 MG/2ML IJ SOLN
INTRAMUSCULAR | Status: AC
  Filled 2019-04-07: qty 2

## 2019-04-07 MED ORDER — FENTANYL CITRATE (PF) 100 MCG/2ML IJ SOLN
INTRAMUSCULAR | Status: AC
  Administered 2019-04-07: 14:00:00
  Filled 2019-04-07: qty 2

## 2019-04-07 MED ORDER — MIDAZOLAM HCL 2 MG/2ML IJ SOLN
INTRAMUSCULAR | Status: AC | PRN
  Administered 2019-04-07: 1 mg via INTRAVENOUS

## 2019-04-07 MED ORDER — MIDAZOLAM HCL 2 MG/2ML IJ SOLN
INTRAMUSCULAR | Status: AC
  Administered 2019-04-07: 14:00:00
  Filled 2019-04-07: qty 2

## 2019-04-07 MED ORDER — SODIUM CHLORIDE 0.9% IV SOLUTION
Freq: Once | INTRAVENOUS | Status: AC
  Administered 2019-04-07: 02:00:00 via INTRAVENOUS

## 2019-04-07 MED ORDER — FENTANYL CITRATE (PF) 100 MCG/2ML IJ SOLN
INTRAMUSCULAR | Status: AC
  Filled 2019-04-07: qty 2

## 2019-04-07 NOTE — Sedation Documentation (Signed)
Report to 5W RN

## 2019-04-07 NOTE — Progress Notes (Signed)
Called nurse to check on patient's willingness to do EEG; patient refused EEG this afternoon but said he may be willing to have the test in the AM. Will perform EEG 08/25 as schedule permits.

## 2019-04-07 NOTE — Progress Notes (Signed)
ANTICOAGULATION CONSULT NOTE - Initial Consult  Pharmacy Consult for Lovenox Indication: h/o DVT  Allergies  Allergen Reactions  . Novocain [Procaine] Other (See Comments)    Unknown reaction  . Vancomycin Other (See Comments)    Nephrotoxicity--discontinued at OSH and changed to Dapto - per [facility] 2013    Patient Measurements: Height: 5\' 2"  (157.5 cm) Weight: 151 lb 14.4 oz (68.9 kg) IBW/kg (Calculated) : 54.6  Vital Signs: Temp: 99.1 F (37.3 C) (08/24 0610) Temp Source: Axillary (08/24 0610) BP: 109/70 (08/24 0610) Pulse Rate: 100 (08/24 0610)  Labs: Recent Labs    04/05/19 0726 04/06/19 0210 04/06/19 2323  HGB 6.7* 7.7* 6.5*  HCT 23.9* 26.1* 21.1*  PLT 413* 375 327  CREATININE 0.72 0.88  --   TROPONINIHS  --  6  --     Estimated Creatinine Clearance: 79.9 mL/min (by C-G formula based on SCr of 0.88 mg/dL).   Medical History: Past Medical History:  Diagnosis Date  . Acute renal failure (ARF)    "that's why I'm jere" (05/01/2012)  . Anxiety   . Chronic lower back pain   . Chronic osteomyelitis   . Diabetes mellitus    "used to take Metformin; told me I didn't need it anymore" (05/01/2012)  . 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 GERD (gastroesophageal reflux disease)   . H/O hiatal hernia 1980's  . History of blood transfusion 02/2012; 04/2012   "I've had 4 units last few days" (05/01/2012)  . Hypertension    "history" (05/01/2012)  . 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 Pneumonia 11/13/2010  . Prostate cancer   . Spinal cord infarction    Assessment:  CC/HPI: 58 y.o. male admitted on 04/03/2019 with presenting with urosepsis. WBC elevated, initial lactic 2.2.  Urine positive for infection. Patient has history of MRSA skin infections and infections in his spine. Multiple sores on abdomen and back that are open.  New MRI of the spine was obtained, but consider need for further evaluation in the  future.  PMH:  paraplegia following spinal cord injury, spinal cord infarction, vague history of spinal cord infection, chronic back pain and osteomyelitis, pressure ulcers, renal impairment, prostate cancer and MRSA skin lesion, anxiety, chronic osteo, DM, DVT 4/12, GERD, HH, HTN,   Anticoag:  Eliquis PTA for h/o DVT/stroke. Spoke to MD in regards to PTA apixaban concerns. Wife reports that apixaban is for history of VTE/stroke. Was put on apixaban per PCP. MD has reached out to PCP to get clear indication. Hgb 7.7>6.5. Plts ok. Transfused 8/22, 8/23.   Goal of Therapy:  Anti-Xa level 0.6-1 units/ml 4hrs after LMWH dose given Monitor platelets by anticoagulation protocol: Yes   Plan:  - Hold home Eliquis - Lovenox 70mg  BID with CBC q 72hrs.   Loreen Bankson S. Alford Highland, PharmD, BCPS Clinical Staff Pharmacist Eilene Ghazi Stillinger 04/07/2019,8:54 AM

## 2019-04-07 NOTE — Sedation Documentation (Signed)
Pt states he refuses procedure

## 2019-04-07 NOTE — Progress Notes (Signed)
Peripherally Inserted Central Catheter/Midline Placement  The IV Nurse has discussed with the patient and/or persons authorized to consent for the patient, the purpose of this procedure and the potential benefits and risks involved with this procedure.  The benefits include less needle sticks, lab draws from the catheter, and the patient may be discharged home with the catheter. Risks include, but not limited to, infection, bleeding, blood clot (thrombus formation), and puncture of an artery; nerve damage and irregular heartbeat and possibility to perform a PICC exchange if needed/ordered by physician.  Alternatives to this procedure were also discussed.  Bard Power PICC patient education guide, fact sheet on infection prevention and patient information card has been provided to patient /or left at bedside.    PICC/Midline Placement Documentation  PICC Single Lumen 04/07/19 PICC Right Brachial 39 cm 0 cm (Active)  Indication for Insertion or Continuance of Line Home intravenous therapies (PICC only) 04/07/19 2148  Exposed Catheter (cm) 0 cm 04/07/19 2148  Site Assessment Clean;Dry;Intact 04/07/19 2148  Line Status Blood return noted;Flushed;Saline locked 04/07/19 2148  Dressing Type Transparent;Occlusive;Securing device 04/07/19 2148  Dressing Status Clean;Dry;Intact;Antimicrobial disc in place 04/07/19 2148  Line Adjustment (NICU/IV Team Only) No 04/07/19 2148  Dressing Intervention New dressing 04/07/19 2148  Dressing Change Due 04/14/19 04/07/19 2148       Edson Snowball 04/07/2019, 10:08 PM

## 2019-04-07 NOTE — Procedures (Signed)
  Procedure: CT paraspinal aspiration (attempted) The patient was repeatedly verbally abusive with multiple staff. Once positioned on the CT gantry, he requested to terminate the procedure before aspiration could be performed. Despite reassurance by MD and offer to initiate the moderate sedation for the procedure, patient clearly and repeatedly insisted on terminating the procedure, and we complied.  EBL:   minimal Complications:  none immediate  We can try again another day if needed. Consider pre medication before attempted positioning in CT.  Dillard Cannon MD Main # [phone] Pager  [phone]

## 2019-04-07 NOTE — Progress Notes (Signed)
PROGRESS NOTE    Clarence Sims  [id number] DOB: September 26, 1960 DOA: 04/03/2019 PCP: Nicoletta Dress, MD   Brief Narrative:  58 year old with history of paraplegia following spinal cord injury/infarction, remote infection, chronic low back pain, history of osteomyelitis, pressure ulcers, prostate cancer came to the hospital with complains of overall weakness for the past week along with fevers and chills.  He is also had chronic indwelling Foley catheter which was changed about 2 weeks ago.  Upon hospitalization noted to be in urosepsis and evidence of infected sebaceous cyst on his back.  He was started on IV vancomycin and Rocephin.  Rocephin was discontinued.  MRI of the spine was positive for epidural/paraspinal abscess therefore IR consulted for drain biopsy-patient is refusing.  Echocardiogram showed ejection fraction 50-55% without any vegetation.  Case discussed with Dr. Megan Salon from infectious disease who recommends only treating with IV vancomycin.  He is aware of urine cultures.   Assessment & Plan:   Principal Problem:   MRSA bacteremia Active Problems:   Paraplegia following spinal cord injury ([place])   Neurogenic bladder   Sacral decubitus ulcer, stage IV ([facility])   Microcytic anemia   Hx of discitis   Polysubstance abuse ([facility])   Chronic pain syndrome  MRSA bacteremia Sepsis secondary to complicated MRSA bactremia Chronic indwelling Foley catheter Mid back sebaceous cyst, infected with drainage Epidural and Paraspinal Abscess - Blood cultures growing gram-positive cocci.  Continue IV vancomycin.  Appreciate input from Dr. Glenford Peers with him this morning on 8/22.  Urine cultures growing chronic colonizer.  We will monitor this clinically. - Lactate has trended down -Foley changed this admission -Blood cultures growing MRSA/gram-positive.  Repeat cultures 8/22-no growth 2 days -Echocardiogram- ejection fraction 50 and 55% -Routine wound care for his sebaceous  cyst. -Wound care team following. -MRI spine -shows vertebral osteo, epidural and paraspinal abscess.  Attempted CT-guided drainage by IR but when patient got downstairs refused the procedure.  Episode of seizure, overnight -EEG-pending -MRI brain-no focal neuro deficits or changes seen in the brain, no stroke.  Does have chronic white matter changes.  Sinus tachycardia -History of cocaine use, continue Cardizem 60 mg every 8 hours.  Hyponatremia secondary to intravascular volume depletion -Continue to monitor  History of paraplegia with neurogenic bladder with chronic indwelling Foley -Foley to be changed out in the hospital.  Supportive care  Anemia of chronic disease and microcytic -Status post 1 unit PRBC transfusion 8/22.  Hemoglobin stable 7.7 now he is refusing lab work - Low iron saturation, borderline low ferritin.  iron supplements with bowel regimen  History of hepatitis C - Unknown if this was treated in the past.  History of chronic pain and illicit drug use/substance use -Pain medicines with bowel regimen  Diabetes mellitus type 2 -Insulin sliding scale Accu-Chek. -Currently awaiting outpatient medication list so can be started on appropriate meds  Bipolar disorder -Awaiting outpatient med list  Patient has had previous history of vertebral osteomyelitis/epidural abscess with MRSA bacteremia. Wife tells me patient had CVA back in 2012 and then was switched over to Eliquis by primary care provider Dr. Jola Schmidt.   Wife also tells me he has had previous drug related seizure when he used to use cocaine.  Was on antiepileptics about 12 years ago but was then taken off.  His last episode was about 2 years ago.  He has not taken antiepileptics in many years and does not follow with a neurologist.  Issues with noncompliance in the hospital with taking  his medication, performing lab work and any procedures.  Continues to ask for more more IV medications, he has been  explained that unless if he does not let us take care of his medical issues which is likely causing his back pain-his pain medications will be de-escalated and possibly stopped.   DVT prophylaxis: Therapeutic dose of Lovenox Code Status: Full code Family Communication: Wife Diane - Updated.  Disposition Plan: Maintain hospital stay for treatment of bacteremia, osteomyelitis and epidural abscess  Consultants:   None  Procedures:   None  Antimicrobials:   Vancomycin    Subjective: Patient went down for his procedure this morning but he refused as he got down there.  Nurse and I saw the patient after he refused the procedure together in his room, patient was adamant that he was not properly taking care of downstairs and he was claiming " they kept poking him to try and get IV lines and Moving them around" I have explained to him that they are just using standard procedure to get him prepped so he can be performed.  He then became very aggressive/verbally abusive towards the IR staff. I have explained to him if he does not get this procedure done it can lead to worsening of the infection including in his bloodstream, paraspinal muscle and his spine.  He understands, not properly treating this infection will lead to death.  For now he is agreed to reattempt this procedure.  I updated his wife.  Review of Systems Otherwise negative except as per HPI, including: General = no fevers, chills, dizziness, malaise, fatigue HEENT/EYES = negative for pain, redness, loss of vision, double vision, blurred vision, loss of hearing, sore throat, hoarseness, dysphagia Cardiovascular= negative for chest pain, palpitation, murmurs, lower extremity swelling Respiratory/lungs= negative for shortness of breath, cough, hemoptysis, wheezing, mucus production Gastrointestinal= negative for nausea, vomiting,, abdominal pain, melena, hematemesis Genitourinary= negative for Dysuria, Hematuria, Change in Urinary  Frequency MSK = Negative for arthralgia, myalgias, Back Pain, Joint swelling  Neurology= Negative for headache, seizures, numbness, tingling  Psychiatry= Negative for anxiety, depression, suicidal and homocidal ideation Allergy/Immunology= Medication/Food allergy as listed  Skin= Negative for Rash, lesions, ulcers, itching  Objective: Vitals:   04/07/19 0234 04/07/19 0610 04/07/19 0927 04/07/19 0930  BP: 96/63 109/70 112/81 111/75  Pulse: (!) 101 100 100 99  Resp: 18 19    Temp: 99.9 F (37.7 C) 99.1 F (37.3 C)    TempSrc:  Axillary    SpO2: 97%  98% 97%  Weight:      Height:        Intake/Output Summary (Last 24 hours) at 04/07/2019 1031 Last data filed at 04/07/2019 0610 Gross per 24 hour  Intake 462 ml  Output --  Net 462 ml   Filed Weights   04/03/19 1642 04/04/19 0600  Weight: 68 kg 68.9 kg    Examination:  Constitutional: Not in acute distress Eyes: PERRL, lids and conjunctivae normal ENMT: Mucous membranes are moist. Posterior pharynx clear of any exudate or lesions.Normal dentition.  Neck: normal, supple, no masses, no thyromegaly Respiratory: clear to auscultation bilaterally, no wheezing, no crackles. Normal respiratory effort. No accessory muscle use.  Cardiovascular: Regular rate and rhythm, no murmurs / rubs / gallops. No extremity edema. 2+ pedal pulses. No carotid bruits.  Abdomen: no tenderness, no masses palpated. No hepatosplenomegaly. Bowel sounds positive.  Musculoskeletal: no clubbing / cyanosis. No joint deformity upper and lower extremities. Good ROM, no contractures. Normal muscle tone.  Skin: no  rashes, lesions, ulcers. No induration Neurologic: Paraplegia, awake alert oriented X3 Psychiatric: Angry   Chronic indwelling Foley  Data Reviewed:   CBC: Recent Labs  Lab 04/03/19 1706 04/03/19 2252 04/04/19 0605 04/05/19 0726 04/06/19 0210 04/06/19 2323  WBC 15.4* 16.7* 13.8* 9.8 11.5* 8.8  NEUTROABS 13.1*  --   --   --   --   --   HGB  8.5* 7.4* 7.2* 6.7* 7.7* 6.5*  HCT 30.3* 25.5* 25.0* 23.9* 26.1* 21.1*  MCV 69.5* 67.1* 68.7* 69.5* 71.1* 67.8*  PLT 511* 499* 452* 413* 375 [id number]   Basic Metabolic Panel: Recent Labs  Lab 04/03/19 1706 04/03/19 2252 04/04/19 0257 04/05/19 0726 04/06/19 0210  NA 132*  --  133* 137 136  K 4.6  --  4.3 4.0 4.2  CL 96*  --  101 106 106  CO2 22  --  21* 22 22  GLUCOSE 175*  --  162* 184* 190*  BUN 11  --  7 8 9   CREATININE 0.89 0.75 0.72 0.72 0.88  CALCIUM 8.8*  --  8.2* 8.2* 8.0*  MG  --  1.9  --  2.1 2.0  PHOS  --  4.2  --   --   --    GFR: Estimated Creatinine Clearance: 79.9 mL/min (by C-G formula based on SCr of 0.88 mg/dL). Liver Function Tests: Recent Labs  Lab 04/03/19 1706  AST 11*  ALT 11  ALKPHOS 89  BILITOT 0.5  PROT 7.8  ALBUMIN 2.6*   No results for input(s): LIPASE, AMYLASE in the last 168 hours. No results for input(s): AMMONIA in the last 168 hours. Coagulation Profile: Recent Labs  Lab 04/03/19 1706  INR 1.2   Cardiac Enzymes: No results for input(s): CKTOTAL, CKMB, CKMBINDEX, TROPONINI in the last 168 hours. BNP (last 3 results) No results for input(s): PROBNP in the last 8760 hours. HbA1C: Recent Labs    04/04/19 1802  HGBA1C 8.0*   CBG: Recent Labs  Lab 04/05/19 2204 04/06/19 0750 04/06/19 1228 04/06/19 1701 04/06/19 2251  GLUCAP 223* 160* 165* 214* 224*   Lipid Profile: No results for input(s): CHOL, HDL, LDLCALC, TRIG, CHOLHDL, LDLDIRECT in the last 72 hours. Thyroid Function Tests: Recent Labs    04/04/19 1802  TSH 0.376   Anemia Panel: Recent Labs    04/04/19 1802  VITAMINB12 298  FERRITIN 156  TIBC 178*  IRON 6*   Sepsis Labs: Recent Labs  Lab 04/03/19 1706 04/03/19 2043 04/06/19 0210  PROCALCITON  --   --  0.17  LATICACIDVEN 2.2* 0.9  --     Recent Results (from the past 240 hour(s))  Culture, blood (Routine x 2)     Status: Abnormal   Collection Time: 04/03/19  3:30 PM   Specimen: BLOOD  Result Value  Ref Range Status   Specimen Description BLOOD RIGHT ANTECUBITAL  Final   Special Requests   Final    BOTTLES DRAWN AEROBIC AND ANAEROBIC Blood Culture results may not be optimal due to an inadequate volume of blood received in culture bottles   Culture  Setup Time   Final    IN BOTH AEROBIC AND ANAEROBIC BOTTLES GRAM POSITIVE COCCI Organism ID to follow CRITICAL RESULT CALLED TO, READ BACK BY AND VERIFIED WITH: Milford Cage [id number] MLM Performed at [facility], [address] [place], [place] [address]    Culture METHICILLIN RESISTANT STAPHYLOCOCCUS AUREUS (A)  Final   Report Status 04/06/2019 FINAL  Final  Organism ID, Bacteria METHICILLIN RESISTANT STAPHYLOCOCCUS AUREUS  Final      Susceptibility   Methicillin resistant staphylococcus aureus - MIC*    CIPROFLOXACIN >=8 RESISTANT Resistant     ERYTHROMYCIN >=8 RESISTANT Resistant     GENTAMICIN <=0.5 SENSITIVE Sensitive     OXACILLIN >=4 RESISTANT Resistant     TETRACYCLINE >=16 RESISTANT Resistant     VANCOMYCIN 1 SENSITIVE Sensitive     TRIMETH/SULFA <=10 SENSITIVE Sensitive     CLINDAMYCIN >=8 RESISTANT Resistant     RIFAMPIN <=0.5 SENSITIVE Sensitive     Inducible Clindamycin NEGATIVE Sensitive     * METHICILLIN RESISTANT STAPHYLOCOCCUS AUREUS  Blood Culture ID Panel (Reflexed)     Status: Abnormal   Collection Time: 04/03/19  3:30 PM  Result Value Ref Range Status   Enterococcus species NOT DETECTED NOT DETECTED Final   Listeria monocytogenes NOT DETECTED NOT DETECTED Final   Staphylococcus species DETECTED (A) NOT DETECTED Final    Comment: CRITICAL RESULT CALLED TO, READ BACK BY AND VERIFIED WITH: PHARMD T BAUMEISTER [id number] MLM    Staphylococcus aureus (BCID) DETECTED (A) NOT DETECTED Final    Comment: Methicillin (oxacillin)-resistant Staphylococcus aureus (MRSA). MRSA is predictably resistant to beta-lactam antibiotics (except ceftaroline). Preferred therapy is vancomycin unless clinically  contraindicated. Patient requires contact precautions if  hospitalized. CRITICAL RESULT CALLED TO, READ BACK BY AND VERIFIED WITH: PHARMD T BAUMEISTER [id number] MLM    Methicillin resistance DETECTED (A) NOT DETECTED Final    Comment: CRITICAL RESULT CALLED TO, READ BACK BY AND VERIFIED WITH: PHARMD T BAUMEISTER [id number] MLM    Streptococcus species NOT DETECTED NOT DETECTED Final   Streptococcus agalactiae NOT DETECTED NOT DETECTED Final   Streptococcus pneumoniae NOT DETECTED NOT DETECTED Final   Streptococcus pyogenes NOT DETECTED NOT DETECTED Final   Acinetobacter baumannii NOT DETECTED NOT DETECTED Final   Enterobacteriaceae species NOT DETECTED NOT DETECTED Final   Enterobacter cloacae complex NOT DETECTED NOT DETECTED Final   Escherichia coli NOT DETECTED NOT DETECTED Final   Klebsiella oxytoca NOT DETECTED NOT DETECTED Final   Klebsiella pneumoniae NOT DETECTED NOT DETECTED Final   Proteus species NOT DETECTED NOT DETECTED Final   Serratia marcescens NOT DETECTED NOT DETECTED Final   Haemophilus influenzae NOT DETECTED NOT DETECTED Final   Neisseria meningitidis NOT DETECTED NOT DETECTED Final   Pseudomonas aeruginosa NOT DETECTED NOT DETECTED Final   Candida albicans NOT DETECTED NOT DETECTED Final   Candida glabrata NOT DETECTED NOT DETECTED Final   Candida krusei NOT DETECTED NOT DETECTED Final   Candida parapsilosis NOT DETECTED NOT DETECTED Final   Candida tropicalis NOT DETECTED NOT DETECTED Final    Comment: Performed at [facility], [place]. [address] [place], [place] [address]  Culture, blood (Routine x 2)     Status: Abnormal   Collection Time: 04/03/19  5:06 PM   Specimen: BLOOD RIGHT FOREARM  Result Value Ref Range Status   Specimen Description BLOOD RIGHT FOREARM  Final   Special Requests   Final    BOTTLES DRAWN AEROBIC AND ANAEROBIC Blood Culture results may not be optimal due to an inadequate volume of blood received in culture bottles   Culture   Setup Time   Final    GRAM POSITIVE COCCI IN BOTH AEROBIC AND ANAEROBIC BOTTLES CRITICAL VALUE NOTED.  VALUE IS CONSISTENT WITH PREVIOUSLY REPORTED AND CALLED VALUE.    Culture (A)  Final    STAPHYLOCOCCUS AUREUS SUSCEPTIBILITIES PERFORMED ON PREVIOUS CULTURE WITHIN  THE LAST 5 DAYS.    Report Status 04/06/2019 FINAL  Final  Urine culture     Status: Abnormal   Collection Time: 04/03/19  5:14 PM   Specimen: Urine, Random  Result Value Ref Range Status   Specimen Description URINE, RANDOM  Final   Special Requests   Final    NONE Performed at [facility], [address], [place], [place] [address]    Culture (A)  Final    >=100,000 COLONIES/mL KLEBSIELLA PNEUMONIAE >=100,000 COLONIES/mL PROTEUS MIRABILIS    Report Status 04/06/2019 FINAL  Final   Organism ID, Bacteria KLEBSIELLA PNEUMONIAE (A)  Final   Organism ID, Bacteria PROTEUS MIRABILIS (A)  Final      Susceptibility   Klebsiella pneumoniae - MIC*    AMPICILLIN >=32 RESISTANT Resistant     CEFAZOLIN <=4 SENSITIVE Sensitive     CEFTRIAXONE <=1 SENSITIVE Sensitive     CIPROFLOXACIN 1 SENSITIVE Sensitive     GENTAMICIN <=1 SENSITIVE Sensitive     IMIPENEM <=0.25 SENSITIVE Sensitive     NITROFURANTOIN 64 INTERMEDIATE Intermediate     TRIMETH/SULFA >=320 RESISTANT Resistant     AMPICILLIN/SULBACTAM 8 SENSITIVE Sensitive     PIP/TAZO 8 SENSITIVE Sensitive     Extended ESBL POSITIVE Resistant     * >=100,000 COLONIES/mL KLEBSIELLA PNEUMONIAE   Proteus mirabilis - MIC*    AMPICILLIN <=2 SENSITIVE Sensitive     CEFAZOLIN <=4 SENSITIVE Sensitive     CEFTRIAXONE <=1 SENSITIVE Sensitive     CIPROFLOXACIN <=0.25 SENSITIVE Sensitive     GENTAMICIN <=1 SENSITIVE Sensitive     IMIPENEM 2 SENSITIVE Sensitive     NITROFURANTOIN 128 RESISTANT Resistant     TRIMETH/SULFA <=20 SENSITIVE Sensitive     AMPICILLIN/SULBACTAM <=2 SENSITIVE Sensitive     PIP/TAZO <=4 SENSITIVE Sensitive     * >=100,000 COLONIES/mL PROTEUS MIRABILIS    SARS Coronavirus 2 [facility] order, Performed in [place] hospital lab) Nasopharyngeal Nasopharyngeal Swab     Status: None   Collection Time: 04/03/19  5:34 PM   Specimen: Nasopharyngeal Swab  Result Value Ref Range Status   SARS Coronavirus 2 NEGATIVE NEGATIVE Final    Comment: (NOTE) If result is NEGATIVE SARS-CoV-2 target nucleic acids are NOT DETECTED. The SARS-CoV-2 RNA is generally detectable in upper and lower  respiratory specimens during the acute phase of infection. The lowest  concentration of SARS-CoV-2 viral copies this assay can detect is 250  copies / mL. A negative result does not preclude SARS-CoV-2 infection  and should not be used as the sole basis for treatment or other  patient management decisions.  A negative result may occur with  improper specimen collection / handling, submission of specimen other  than nasopharyngeal swab, presence of viral mutation(s) within the  areas targeted by this assay, and inadequate number of viral copies  (<250 copies / mL). A negative result must be combined with clinical  observations, patient history, and epidemiological information. If result is POSITIVE SARS-CoV-2 target nucleic acids are DETECTED. The SARS-CoV-2 RNA is generally detectable in upper and lower  respiratory specimens dur ing the acute phase of infection.  Positive  results are indicative of active infection with SARS-CoV-2.  Clinical  correlation with patient history and other diagnostic information is  necessary to determine patient infection status.  Positive results do  not rule out bacterial infection or co-infection with other viruses. If result is PRESUMPTIVE POSTIVE SARS-CoV-2 nucleic acids MAY BE PRESENT.   A presumptive  positive result was obtained on the submitted specimen  and confirmed on repeat testing.  While 2019 novel coronavirus  (SARS-CoV-2) nucleic acids may be present in the submitted sample  additional confirmatory testing may be  necessary for epidemiological  and / or clinical management purposes  to differentiate between  SARS-CoV-2 and other Sarbecovirus currently known to infect humans.  If clinically indicated additional testing with an alternate test  methodology [phone]) is advised. The SARS-CoV-2 RNA is generally  detectable in upper and lower respiratory sp ecimens during the acute  phase of infection. The expected result is Negative. Fact Sheet for Patients:  StrictlyIdeas.no Fact Sheet for Healthcare Providers: BankingDealers.co.za This test is not yet approved or cleared by the [place] FDA and has been authorized for detection and/or diagnosis of SARS-CoV-2 by FDA under an Emergency Use Authorization (EUA).  This EUA will remain in effect (meaning this test can be used) for the duration of the COVID-19 declaration under Section 564(b)(1) of the Act, 21 U.S.C. section 360bbb-3(b)(1), unless the authorization is terminated or revoked sooner. Performed at [facility], [place] [address] [place], [place] [address]   MRSA PCR Screening     Status: Abnormal   Collection Time: 04/03/19  9:45 PM   Specimen: Nasal Mucosa; Nasopharyngeal  Result Value Ref Range Status   MRSA by PCR POSITIVE (A) NEGATIVE Final    Comment:        The GeneXpert MRSA Assay (FDA approved for NASAL specimens only), is one component of a comprehensive MRSA colonization surveillance program. It is not intended to diagnose MRSA infection nor to guide or monitor treatment for MRSA infections. RESULT CALLED TO, READ BACK BY AND VERIFIED WITH: Donalda Ewings RN 04/03/19 2333 JDW Performed at [facility] [address], [place], [place] [address]   Culture, blood (routine x 2)     Status: None (Preliminary result)   Collection Time: 04/05/19  7:10 AM   Specimen: BLOOD  Result Value Ref Range Status   Specimen Description BLOOD RIGHT ANTECUBITAL  Final   Special  Requests   Final    BOTTLES DRAWN AEROBIC ONLY Blood Culture adequate volume   Culture   Final    NO GROWTH 2 DAYS Performed at [facility], [place] [address], [place], [place] [address]    Report Status PENDING  Incomplete  Culture, blood (routine x 2)     Status: None (Preliminary result)   Collection Time: 04/05/19  7:22 AM   Specimen: BLOOD  Result Value Ref Range Status   Specimen Description BLOOD RIGHT ANTECUBITAL  Final   Special Requests   Final    BOTTLES DRAWN AEROBIC ONLY Blood Culture adequate volume   Culture   Final    NO GROWTH 2 DAYS Performed at [facility], [place] [address], [place], [place] [address]    Report Status PENDING  Incomplete         Radiology Studies: Mr Brain Wo Contrast  Result Date: 04/06/2019 CLINICAL DATA:  Encephalopathy.  Seizure. EXAM: MRI HEAD WITHOUT CONTRAST TECHNIQUE: Multiplanar, multiecho pulse sequences of the brain and surrounding structures were obtained without intravenous contrast. COMPARISON:  CT head without contrast 12/16/2014. FINDINGS: Brain: The study is moderately degraded by patient motion. The diffusion-weighted images demonstrate no acute or subacute infarction. Scattered white matter changes are mildly advanced for age. The internal auditory canals are within normal limits. The brainstem and cerebellum are within normal limits. Vascular: Flow is present  in the major intracranial arteries. Skull and upper cervical spine: Degenerative changes are present in the upper cervical spine. There is slight anterolisthesis at C2-3. Leftward disc osteophyte complex is present at C3-4. Sinuses/Orbits: The paranasal sinuses and mastoid air cells are clear. The globes and orbits are within normal limits. IMPRESSION: 1. No acute or focal abnormality to explain seizures or encephalopathy. 2. Scattered white matter changes are mildly advanced for age. Electronically Signed   By: San Morelle M.D.   On: 04/06/2019 14:06   Mr  Lumbar Spine W Wo Contrast  Result Date: 04/05/2019 CLINICAL DATA:  MRSA bacteremia, paraplegia, spinal cord injury previously. Chronic low back pain. EXAM: MRI LUMBAR SPINE WITHOUT AND WITH CONTRAST TECHNIQUE: Multiplanar and multiecho pulse sequences of the lumbar spine were obtained without and with intravenous contrast. CONTRAST:  7 mL Gadavist COMPARISON:  None. FINDINGS: Segmentation:  Standard. Alignment:  Physiologic. Vertebrae:  No acute fracture.  No aggressive osseous lesion. Fluid signal within the L4-5 disc space with marrow edema on either side of the disc space within the vertebral bodies and peripheral disc enhancement most concerning for discitis-osteomyelitis. 1.7 x 4 x 3.7 cm complex peripherally enhancing fluid collection in the left paraspinal muscle with an adjacent 1.5 x 2 cm fluid collection in the left paraspinal muscle abutting the facet joint along the posterior margin. Similar 1.2 x 2.2 cm complex fluid collection with peripheral enhancement in the right paraspinal muscle adjacent to the spinous process. Epidural enhancement along the ventral and dorsal aspect with a small peripherally enhancing fluid collection along the dorsal aspect of the thecal sac measuring 1.2 x 0.8 cm at the level of L4 most concerning for a small abscess. Inflammatory changes and the abscess result in overall severe spinal stenosis. Conus medullaris and cauda equina: Conus extends to the L2 level. Conus and cauda equina appear normal. Paraspinal and other soft tissues: No other paraspinal abnormality. Disc levels: Disc spaces: Degenerative disc disease with disc height loss at L5-S1 and L1-2. T12-L1: No significant disc bulge. No evidence of neural foraminal stenosis. No central canal stenosis. L1-L2: Mild broad-based disc bulge. Moderate bilateral facet arthropathy. No evidence of neural foraminal stenosis. No central canal stenosis. L2-L3: No significant disc bulge. No evidence of neural foraminal stenosis.  No central canal stenosis. Mild bilateral facet arthropathy. L3-L4: Mild broad-based disc bulge. Moderate bilateral facet arthropathy. Moderate spinal stenosis related to the epidural phlegmonous changes. No evidence of neural foraminal stenosis. L4-L5: No disc protrusion. Severe spinal stenosis resulting from the epidural phlegmonous changes and the dorsal epidural abscess measuring 1.2 x 0.8 cm. Moderate bilateral facet arthropathy. Moderate bilateral foraminal stenosis. L5-S1: Broad-based disc osteophyte complex. Severe bilateral scratch them moderate bilateral facet arthropathy. Moderate bilateral foraminal stenosis. IMPRESSION: 1. Discitis-osteomyelitis at L4-5 with epidural phlegmonous changes and a 1.2 x 0.8 cm epidural abscess along the dorsal aspect at the level of L4. Severe spinal stenosis at L4-5 resulting from the phlegmonous changes and facet arthropathy. Infectious myositis of the multifidus muscle posteriorly with right and left intramuscular abscesses. The largest intramuscular abscess in the left multifidus muscle measures 1.7 x 4 x 3.7 cm. 2. Lumbar spine spondylosis as described above. Electronically Signed   By: Kathreen Devoid   On: 04/05/2019 15:31   Dg Chest Port 1 View  Result Date: 04/06/2019 CLINICAL DATA:  Shortness of breath EXAM: PORTABLE CHEST 1 VIEW COMPARISON:  Chest radiograph 04/03/2019 FINDINGS: Monitoring leads overlie the patient. Stable cardiac and mediastinal contours. Patchy bilateral lower lung opacities.  No pleural effusion or pneumothorax. IMPRESSION: Heterogeneous opacities right greater than left lower lungs may represent atelectasis. Infection not excluded. Electronically Signed   By: Lovey Newcomer M.D.   On: 04/06/2019 11:58        Scheduled Meds:  sodium chloride   Intravenous Once   baclofen  40 mg Oral TID   busPIRone  10 mg Oral TID   Chlorhexidine Gluconate Cloth  6 each Topical Q0600   diltiazem  60 mg Oral Q8H   [START ON 04/08/2019] enoxaparin  (LOVENOX) injection  70 mg Subcutaneous Q12H   ferrous sulfate  325 mg Oral BID WC   FLUoxetine  40 mg Oral Daily   insulin aspart  0-15 Units Subcutaneous TID WC   insulin aspart  0-5 Units Subcutaneous QHS   insulin aspart  4 Units Subcutaneous TID WC   insulin glargine  15 Units Subcutaneous Daily   linaclotide  145 mcg Oral QAC breakfast   mupirocin ointment  1 application Nasal BID   mupirocin ointment   Topical Daily   oxyCODONE-acetaminophen  1 tablet Oral Once   traZODone  150 mg Oral QHS   Continuous Infusions:  sodium chloride 125 mL/hr at 04/06/19 0600   vancomycin 1,250 mg (04/06/19 1818)     LOS: 4 days   Time spent= 35 mins    Jeannifer Drakeford Arsenio Loader, MD [facility]  If 7PM-7AM, please contact night-coverage [email or web address] 04/07/2019, 10:31 AM

## 2019-04-07 NOTE — Progress Notes (Signed)
Patient ID: Clarence Sims, male   DOB: [id number], 58 y.o.   MRN: [id number]          [facility] for Infectious Disease    Date of Admission:  04/03/2019   Day 5 vancomycin         Clarence Sims is afebrile on therapy for MRSA bacteremia.  His repeat blood cultures are negative at 48 hours.  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  I agree with PICC placement.  He has chronic pain syndrome and history of polysubstance abuse.  He also has a history of injecting drug use.  I will talk to him about this tomorrow to help determine if he is a candidate for outpatient IV antibiotic therapy.         Michel Bickers, MD [facility] [place] [facility] [phone] pager   [phone] cell 04/07/2019, 2:20 PM

## 2019-04-07 NOTE — Procedures (Signed)
  Procedure: CT aspiration L paraspinal abscess 5ml purulent for GS, C&S EBL:   minimal Complications:  none immediate  See full dictation in [employer].  Dillard Cannon MD Main # [phone] Pager  [phone]

## 2019-04-08 ENCOUNTER — Inpatient Hospital Stay (HOSPITAL_COMMUNITY): Payer: Medicaid Other

## 2019-04-08 LAB — BPAM RBC
Blood Product Expiration Date: 202009062359
Blood Product Expiration Date: 202009062359
ISSUE DATE / TIME: 202008222111
ISSUE DATE / TIME: 202008240215
Unit Type and Rh: 7300
Unit Type and Rh: 7300

## 2019-04-08 LAB — GLUCOSE, CAPILLARY
Glucose-Capillary: 232 mg/dL — ABNORMAL HIGH (ref 70–99)
Glucose-Capillary: 212 mg/dL — ABNORMAL HIGH (ref 70–99)
Glucose-Capillary: 114 mg/dL — ABNORMAL HIGH (ref 70–99)
Glucose-Capillary: 345 mg/dL — ABNORMAL HIGH (ref 70–99)

## 2019-04-08 LAB — TYPE AND SCREEN
ABO/RH(D): B POS
Antibody Screen: NEGATIVE
Unit division: 0
Unit division: 0

## 2019-04-08 LAB — BASIC METABOLIC PANEL
Anion gap: 9 (ref 5–15)
BUN: 10 mg/dL (ref 6–20)
CO2: 24 mmol/L (ref 22–32)
Calcium: 8.5 mg/dL — ABNORMAL LOW (ref 8.9–10.3)
Chloride: 104 mmol/L (ref 98–111)
Creatinine, Ser: 0.77 mg/dL (ref 0.61–1.24)
GFR calc Af Amer: >60 mL/min (ref >60)
GFR calc non Af Amer: >60 mL/min (ref >60)
Glucose, Bld: 290 mg/dL — ABNORMAL HIGH (ref 70–99)
Potassium: 3.9 mmol/L (ref 3.5–5.1)
Sodium: 137 mmol/L (ref 135–145)

## 2019-04-08 LAB — CBC
HCT: 28.4 % — ABNORMAL LOW (ref 39.0–52.0)
Hemoglobin: 8.2 g/dL — ABNORMAL LOW (ref 13.0–17.0)
MCH: 21.8 pg — ABNORMAL LOW (ref 26.0–34.0)
MCHC: 28.9 g/dL — ABNORMAL LOW (ref 30.0–36.0)
MCV: 75.5 fL — ABNORMAL LOW (ref 80.0–100.0)
Platelets: 421 10*3/uL — ABNORMAL HIGH (ref 150–400)
RBC: 3.76 MIL/uL — ABNORMAL LOW (ref 4.22–5.81)
RDW: 22.7 % — ABNORMAL HIGH (ref 11.5–15.5)
WBC: 8.8 10*3/uL (ref 4.0–10.5)
nRBC: 0 % (ref 0.0–0.2)

## 2019-04-08 LAB — MAGNESIUM: Magnesium: 2 mg/dL (ref 1.7–2.4)

## 2019-04-08 MED ORDER — JUVEN PO PACK
1.0000 | PACK | Freq: Two times a day (BID) | ORAL | Status: DC
  Administered 2019-04-09 – 2019-04-12 (×5): 1 via ORAL
  Filled 2019-04-08 (×6): qty 1

## 2019-04-08 MED ORDER — CHLORHEXIDINE GLUCONATE CLOTH 2 % EX PADS
6.0000 | MEDICATED_PAD | Freq: Every day | CUTANEOUS | Status: DC
  Administered 2019-04-09 – 2019-04-12 (×4): 6 via TOPICAL

## 2019-04-08 MED ORDER — NYSTATIN 100000 UNIT/ML MT SUSP
5.0000 mL | Freq: Four times a day (QID) | OROMUCOSAL | Status: DC
  Administered 2019-04-08 – 2019-04-12 (×16): 500000 [IU] via ORAL
  Filled 2019-04-08 (×16): qty 5

## 2019-04-08 MED ORDER — ADULT MULTIVITAMIN W/MINERALS CH
1.0000 | ORAL_TABLET | Freq: Every day | ORAL | Status: DC
  Administered 2019-04-08 – 2019-04-12 (×5): 1 via ORAL
  Filled 2019-04-08 (×5): qty 1

## 2019-04-08 MED ORDER — ORITAVANCIN DIPHOSPHATE 400 MG IV SOLR
1200.0000 mg | Freq: Once | INTRAVENOUS | Status: AC
  Administered 2019-04-08: 1200 mg via INTRAVENOUS
  Filled 2019-04-08: qty 120

## 2019-04-08 MED ORDER — ENSURE MAX PROTEIN PO LIQD
11.0000 [oz_av] | Freq: Every day | ORAL | Status: DC
  Administered 2019-04-08 – 2019-04-12 (×4): 11 [oz_av] via ORAL
  Filled 2019-04-08 (×5): qty 330

## 2019-04-08 NOTE — Progress Notes (Signed)
Inpatient Diabetes Program Recommendations  AACE/ADA: New Consensus Statement on Inpatient Glycemic Control (2015)  Target Ranges:  Prepandial:   less than 140 mg/dL      Peak postprandial:   less than 180 mg/dL (1-2 hours)      Critically ill patients:  140 - 180 mg/dL   Lab Results  Component Value Date   GLUCAP 232 (H) 04/08/2019   HGBA1C 8.0 (H) 04/04/2019    Review of Glycemic Control Results for SHAHIR, WOLAK (MRN [id number]) as of 04/08/2019 12:01  Ref. Range 04/06/2019 22:51 04/07/2019 12:10 04/07/2019 17:20 04/07/2019 21:09 04/08/2019 08:02  Glucose-Capillary Latest Ref Range: 70 - 99 mg/dL 224 (H) 143 (H) 138 (H) 314 (H) 232 (H)   Diabetes history: DM2 Outpatient Diabetes medications: Lantis 40 units + Metformin 1 gm bid Current orders for Inpatient glycemic control: Lantus 15 units + Novolog 4 units tid + Novolog moderate correction tid + hs 0-5 units  Inpatient Diabetes Program Recommendations:   -Increase Lantus to 50% home dose = 20 units  Thank you, Bethena Roys E. Mclane Arora, RN, MSN, CDE  Diabetes Coordinator Inpatient Glycemic Control Team Team Pager [phone] (8am-5pm) 04/08/2019 12:02 PM

## 2019-04-08 NOTE — Progress Notes (Signed)
Initial Nutrition Assessment  DOCUMENTATION CODES:   Not applicable  INTERVENTION:  -Ensure Max daily, each supplement provides 150 kcal and 30 grams protein --1 packet Juven BID, each packet provides 95 calories, 2.5 grams of protein (collagen), and 9.8 grams of carbohydrate (3 grams sugar); also contains 7 grams of L-arginine and L-glutamine, 300 mg vitamin C, 15 mg vitamin E, 1.2 mcg vitamin B-12, 9.5 mg zinc, 200 mg calcium, and 1.5 g  Calcium Beta-hydroxy-Beta-methylbutyrate to support wound healing -MVI with minerals daily   NUTRITION DIAGNOSIS:   Increased nutrient needs related to wound healing as evidenced by estimated needs.   GOAL:   Patient will meet greater than or equal to 90% of their needs   MONITOR:   PO intake, Weight trends, Supplement acceptance, I & O's, Skin, Labs  REASON FOR ASSESSMENT:   Low Braden    ASSESSMENT:  RD working remotely.  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 Patient is reported to have been verbally abusive to multiple staff members during admission.   8/24 PICC - concerns for outpt IV antibiotic therapy secondary to reported history of IV drug use by patient wife 8/24 - IR CT aspiration L paraspinal abscess  8/23 - IR consulted for drain biopsy - patient refusing procedure 8/22 - 1 unit PRBC; hemoglobin currently stable, iron supplements with bowel regimen 8/20MRI brain - no focal neuro deficits, no stroke; does have chronic white matter changes  Currently eating 70-100% of meals at this time. RD to  order Ensure Max and Juven to assist with increased needs and promote wound healing  Current wt 68.9 kg (151.6 lb) +1; BLE Last wt 69 kg taken in 2013  Medications reviewed and include: baclofen, buspar, cardizem, lovenox, ferrous sulfate, prozac, SSI, novolog 4 units with meals, lantus 15 units daily, linzess, nystatin, oritavancin diphosphate  Labs: [facility] [id number] Lab Results  Component Value Date   HGBA1C 8.0 (H) 04/04/2019     NUTRITION - FOCUSED PHYSICAL EXAM: Unable to complete at this time   Diet Order:   Diet Order            Diet regular Room service appropriate? Yes; Fluid consistency: Thin  Diet effective now              EDUCATION NEEDS:   No education needs have been identified at this time  Skin:  Skin Assessment: Reviewed RN Assessment(stage III; sacrum, incision; closed; mid back)  Last BM:  8/18  Height:   Ht Readings from Last 1 Encounters:  04/03/19 5\' 2"  (1.575 m)    Weight:   Wt Readings from Last 1 Encounters:  04/04/19 68.9 kg    Ideal Body Weight:  51 kg(Adjusted IBW for paraplegia)  BMI:  Body mass index is 27.78 kg/m.  Estimated Nutritional Needs:   Kcal:  [id number]  Protein:  87-96  Fluid:  >1.7L  Lajuan Lines, RD, LDN [facility] Telephone [phone] After Hours/Weekend Pager: [phone]

## 2019-04-08 NOTE — Progress Notes (Addendum)
ID Pharmacy Progress Note    Myself and Eddie Candle , a PGY1 pharmacy resident, spent some time speaking with Mr. Abair this afternoon about his home situation and previous social history.   Mr. Gunnell lives with his wife of 76 years, Diane in his home and she helps to take care of him. He states that he has never done injection drugs, only smoked crack and pot. He also stated that he has not done any illicit substances in 2 years due to concerns for his liver.   Mr. Flattery also told us about some issues that he has had with his sister recently involving her calling the police on his wife.   Mr. Kreg Shropshire stated to [place] that he would not be willing to stay in the hospital for the entirety of his therapy and is adamant about going home.   Unfortunately, he does not seem to be a good candidate for home IV therapy.   We shared this information with Dr. Megan Salon and have decided to give the patient a 1 time dose of oritavancin this evening in case he decides to leave AMA.   If he does leave AMA, he can be given a 30 day supply of Bactrim DS - 1 tablet by mouth twice daily starting 9/4 to complete his therapy/    If he decided to stay, Vancomycin can be re-started on 9/4.   Plan - Oritavancin X 1 8/25 - If patient leaves AMA, prescribe Bactrim DS - 1 tab BID X 30 days starting 9/4 (Order pended in discharge orders) - If patient decides to stay in house or goes to SNF, Vancomycin can be re-started on 9/4   Jimmy Footman, PharmD, BCPS, [facility] Clinical Pharmacist  Phone: [phone] 04/08/2019 2:05 PM

## 2019-04-08 NOTE — Plan of Care (Signed)
  Problem: Education: Goal: Knowledge of General Education information will improve Description: Including pain rating scale, medication(s)/side effects and non-pharmacologic comfort measures Outcome: Progressing   Problem: Health Behavior/Discharge Planning: Goal: Ability to manage health-related needs will improve Outcome: Not Progressing Note: Paraplegia, education on antibiotic treatment needs   Problem: Clinical Measurements: Goal: Ability to maintain clinical measurements within normal limits will improve Outcome: Progressing Goal: Will remain free from infection Outcome: Progressing Goal: Diagnostic test results will improve Outcome: Progressing   Problem: Nutrition: Goal: Adequate nutrition will be maintained Outcome: Progressing   Problem: Coping: Goal: Level of anxiety will decrease Outcome: Progressing   Problem: Elimination: Goal: Will not experience complications related to bowel motility Outcome: Not Progressing Note: Concerned about moving bowels and soiling wounds   Problem: Pain Managment: Goal: General experience of comfort will improve Outcome: Progressing   Problem: Safety: Goal: Ability to remain free from injury will improve Outcome: Progressing   Problem: Skin Integrity: Goal: Risk for impaired skin integrity will decrease Outcome: Progressing

## 2019-04-08 NOTE — Procedures (Signed)
Patient Name: Clarence Sims  MRN: [id number]  Epilepsy Attending: Lora Havens  Referring Physician/Provider: Dr Gerlean Ren Date: 04/08/2019 Duration: 24.51 mins  Patient history: 58yo M with MRSA bacteremia and seizure like episode. EEG to evaluate for seizure  Level of alertness: awake  AEDs during EEG study: None  Technical aspects: This EEG study was done with scalp electrodes positioned according to the 10-20 International system of electrode placement. Electrical activity was acquired at a sampling rate of 500Hz  and reviewed with a high frequency filter of 70Hz  and a low frequency filter of 1Hz . EEG data were recorded continuously and digitally stored.   Description: The posterior dominant rhythm consists of 8-9 Hz activity of moderate voltage (25-35 uV) seen predominantly in posterior head regions, symmetric and reactive to eye opening and eye closing.  There was also intermittent rhythmic 2 to 3 Hz generalized delta slowing.  Physiologic photic driving was not seen during photic stimulation.  Hyperventilation was not performed.  IMPRESSION: This study is suggestive of mild diffuse encephalopathy, nonspecific to etiology.  No seizures or epileptiform discharges were seen throughout the recording. However, only wakefulness was recorded. If suspicion for interictal activity remains a concern, a prolonged study including sleep should be considered.   Kristiann Noyce Barbra Sarks

## 2019-04-08 NOTE — Progress Notes (Signed)
EEG complete - results pending 

## 2019-04-08 NOTE — Progress Notes (Signed)
PROGRESS NOTE    Clarence Sims  [id number] DOB: 18-May-1961 DOA: 04/03/2019 PCP: Nicoletta Dress, MD   Brief Narrative:  58 year old with history of paraplegia following spinal cord injury/infarction, remote infection, chronic low back pain, history of osteomyelitis, pressure ulcers, prostate cancer came to the hospital with complains of overall weakness for the past week along with fevers and chills.  He is also had chronic indwelling Foley catheter which was changed about 2 weeks ago.  Upon hospitalization noted to be in urosepsis and evidence of infected sebaceous cyst on his back.  He was started on IV vancomycin and Rocephin.  Rocephin was discontinued.  MRI of the spine was positive for epidural/paraspinal abscess therefore IR consulted for drain biopsy-patient is refusing.  Echocardiogram showed ejection fraction 50-55% without any vegetation.  Case discussed with Dr. Megan Salon from infectious disease who recommends only treating with IV vancomycin.  He is aware of urine cultures.   Assessment & Plan:   Principal Problem:   MRSA bacteremia Active Problems:   Paraplegia following spinal cord injury ([facility])   Neurogenic bladder   Sacral decubitus ulcer, stage IV (HCC)   Microcytic anemia   Hx of discitis   Polysubstance abuse (HCC)   Chronic pain syndrome  MRSA bacteremia Sepsis secondary to complicated MRSA bactremia Chronic indwelling Foley catheter Mid back sebaceous cyst, infected with drainage Epidural and Paraspinal Abscess - Blood cultures are positive.  Repeat cultures for now are negative.  Has PICC line in place.  ID following. - Lactate has trended down -Foley changed this admission -Blood cultures growing MRSA/gram-positive.  Repeat cultures 8/22-no growth 2 days -Echocardiogram- ejection fraction 50 and 55% -Routine wound care for his sebaceous cyst. -Wound care team following. -MRI spine -shows vertebral osteo, epidural and paraspinal abscess.  Attempted  CT-guided drainage by IR but when patient got downstairs refused the procedure.  Episode of seizure -EEG-pending- to be done today.  -MRI brain-no focal neuro deficits or changes seen in the brain, no stroke.  Does have chronic white matter changes.  Sinus tachycardia -History of cocaine use, UDS- neg. Doing ok on Cardizem.   Hyponatremia secondary to intravascular volume depletion -Continue to monitor  History of paraplegia with neurogenic bladder with chronic indwelling Foley -Foley to be changed out in the hospital.  Supportive care  Anemia of chronic disease and microcytic -Status post 1 unit PRBC transfusion 8/22.  Hemoglobin stable, no active bleeding. - Low iron saturation, borderline low ferritin.  iron supplements with bowel regimen  History of hepatitis C - Unknown if this was treated in the past.  History of chronic pain and illicit drug use/substance use -Pain medicines with bowel regimen  Diabetes mellitus type 2 -Insulin sliding scale Accu-Chek. -Currently awaiting outpatient medication list so can be started on appropriate meds  Bipolar disorder -Awaiting outpatient med list  Given his previous history of medication noncompliance and drug use, will need to address disposition with infectious disease.  He will be high risk discharging with PICC line.   DVT prophylaxis: Therapeutic dose of Lovenox Code Status: Full code Family Communication: Will update his wife Diane Disposition Plan: Maintain hospital stay for IV antibiotics.  Consultants:   None  Procedures:   None  Antimicrobials:   Vancomycin    Subjective: Feels okay, no complaints.  Review of Systems Otherwise negative except as per HPI, including: General = no fevers, chills, dizziness, malaise, fatigue HEENT/EYES = negative for pain, redness, loss of vision, double vision, blurred vision, loss of hearing,  sore throat, hoarseness, dysphagia Cardiovascular= negative for chest pain,  palpitation, murmurs, lower extremity swelling Respiratory/lungs= negative for shortness of breath, cough, hemoptysis, wheezing, mucus production Gastrointestinal= negative for nausea, vomiting,, abdominal pain, melena, hematemesis Genitourinary= negative for Dysuria, Hematuria, Change in Urinary Frequency MSK = Negative for arthralgia, myalgias, Back Pain, Joint swelling  Neurology= Negative for headache, seizures, numbness, tingling  Psychiatry= Negative for anxiety, depression, suicidal and homocidal ideation Allergy/Immunology= Medication/Food allergy as listed  Skin= Negative for Rash, lesions, ulcers, itching   Objective: Vitals:   04/08/19 0553 04/08/19 0900 04/08/19 1329 04/08/19 1335  BP: 105/66 127/81 111/79 111/79  Pulse: 94 72  (!) 102  Resp: 20     Temp: 97.8 F (36.6 C)     TempSrc: Oral     SpO2: 99% 100%    Weight:      Height:        Intake/Output Summary (Last 24 hours) at 04/08/2019 1447 Last data filed at 04/08/2019 1400 Gross per 24 hour  Intake 720 ml  Output --  Net 720 ml   Filed Weights   04/03/19 1642 04/04/19 0600  Weight: 68 kg 68.9 kg    Examination:  Constitutional: NAD, calm, comfortable Eyes: PERRL, lids and conjunctivae normal ENMT: Mucous membranes are moist. Posterior pharynx clear of any exudate or lesions.Normal dentition.  Neck: normal, supple, no masses, no thyromegaly Respiratory: clear to auscultation bilaterally, no wheezing, no crackles. Normal respiratory effort. No accessory muscle use.  Cardiovascular: Regular rate and rhythm, no murmurs / rubs / gallops. No extremity edema. 2+ pedal pulses. No carotid bruits.  Abdomen: no tenderness, no masses palpated. No hepatosplenomegaly. Bowel sounds positive.  Musculoskeletal: no clubbing / cyanosis. No joint deformity upper and lower extremities. Good ROM, no contractures. Normal muscle tone.  Skin: no rashes, lesions, ulcers. No induration Neurologic: CN 2-XII Psychiatric: Normal  judgment and insight. Alert and oriented x 3.   Chronic indwelling Foley  Data Reviewed:   CBC: Recent Labs  Lab 04/03/19 1706  04/05/19 0726 04/06/19 0210 04/06/19 2323 04/07/19 1111 04/08/19 0328  WBC 15.4*   < > 9.8 11.5* 8.8 10.4 8.8  NEUTROABS 13.1*  --   --   --   --   --   --   HGB 8.5*   < > 6.7* 7.7* 6.5* 8.8* 8.2*  HCT 30.3*   < > 23.9* 26.1* 21.1* 28.9* 28.4*  MCV 69.5*   < > 69.5* 71.1* 67.8* 72.8* 75.5*  PLT 511*   < > 413* 375 327 409* 421*   < > = values in this interval not displayed.   Basic Metabolic Panel: Recent Labs  Lab 04/03/19 2252 04/04/19 0257 04/05/19 0726 04/06/19 0210 04/07/19 1111 04/08/19 0328  NA  --  133* 137 136 138 137  K  --  4.3 4.0 4.2 4.3 3.9  CL  --  101 106 106 105 104  CO2  --  21* 22 22 24 24   GLUCOSE  --  162* 184* 190* 164* 290*  BUN  --  7 8 9 8 10   CREATININE 0.75 0.72 0.72 0.88 0.72 0.77  CALCIUM  --  8.2* 8.2* 8.0* 8.6* 8.5*  MG 1.9  --  2.1 2.0 1.9 2.0  PHOS 4.2  --   --   --   --   --    GFR: Estimated Creatinine Clearance: 87.9 mL/min (by C-G formula based on SCr of 0.77 mg/dL). Liver Function Tests: Recent Labs  Lab 04/03/19 1706  AST 11*  ALT 11  ALKPHOS 89  BILITOT 0.5  PROT 7.8  ALBUMIN 2.6*   No results for input(s): LIPASE, AMYLASE in the last 168 hours. No results for input(s): AMMONIA in the last 168 hours. Coagulation Profile: Recent Labs  Lab 04/03/19 1706  INR 1.2   Cardiac Enzymes: No results for input(s): CKTOTAL, CKMB, CKMBINDEX, TROPONINI in the last 168 hours. BNP (last 3 results) No results for input(s): PROBNP in the last 8760 hours. HbA1C: No results for input(s): HGBA1C in the last 72 hours. CBG: Recent Labs  Lab 04/07/19 1210 04/07/19 1720 04/07/19 2109 04/08/19 0802 04/08/19 1256  GLUCAP 143* 138* 314* 232* 212*   Lipid Profile: No results for input(s): CHOL, HDL, LDLCALC, TRIG, CHOLHDL, LDLDIRECT in the last 72 hours. Thyroid Function Tests: No results for  input(s): TSH, T4TOTAL, FREET4, T3FREE, THYROIDAB in the last 72 hours. Anemia Panel: No results for input(s): VITAMINB12, FOLATE, FERRITIN, TIBC, IRON, RETICCTPCT in the last 72 hours. Sepsis Labs: Recent Labs  Lab 04/03/19 1706 04/03/19 2043 04/06/19 0210  PROCALCITON  --   --  0.17  LATICACIDVEN 2.2* 0.9  --     Recent Results (from the past 240 hour(s))  Culture, blood (Routine x 2)     Status: Abnormal   Collection Time: 04/03/19  3:30 PM   Specimen: BLOOD  Result Value Ref Range Status   Specimen Description BLOOD RIGHT ANTECUBITAL  Final   Special Requests   Final    BOTTLES DRAWN AEROBIC AND ANAEROBIC Blood Culture results may not be optimal due to an inadequate volume of blood received in culture bottles   Culture  Setup Time   Final    IN BOTH AEROBIC AND ANAEROBIC BOTTLES GRAM POSITIVE COCCI Organism ID to follow CRITICAL RESULT CALLED TO, READ BACK BY AND VERIFIED WITH: Milford Cage [id number] MLM Performed at [facility], [address], [place], [place] [address]    Culture METHICILLIN RESISTANT STAPHYLOCOCCUS AUREUS (A)  Final   Report Status 04/06/2019 FINAL  Final   Organism ID, Bacteria METHICILLIN RESISTANT STAPHYLOCOCCUS AUREUS  Final      Susceptibility   Methicillin resistant staphylococcus aureus - MIC*    CIPROFLOXACIN >=8 RESISTANT Resistant     ERYTHROMYCIN >=8 RESISTANT Resistant     GENTAMICIN <=0.5 SENSITIVE Sensitive     OXACILLIN >=4 RESISTANT Resistant     TETRACYCLINE >=16 RESISTANT Resistant     VANCOMYCIN 1 SENSITIVE Sensitive     TRIMETH/SULFA <=10 SENSITIVE Sensitive     CLINDAMYCIN >=8 RESISTANT Resistant     RIFAMPIN <=0.5 SENSITIVE Sensitive     Inducible Clindamycin NEGATIVE Sensitive     * METHICILLIN RESISTANT STAPHYLOCOCCUS AUREUS  Blood Culture ID Panel (Reflexed)     Status: Abnormal   Collection Time: 04/03/19  3:30 PM  Result Value Ref Range Status   Enterococcus species NOT DETECTED NOT DETECTED Final    Listeria monocytogenes NOT DETECTED NOT DETECTED Final   Staphylococcus species DETECTED (A) NOT DETECTED Final    Comment: CRITICAL RESULT CALLED TO, READ BACK BY AND VERIFIED WITH: PHARMD T BAUMEISTER [id number] MLM    Staphylococcus aureus (BCID) DETECTED (A) NOT DETECTED Final    Comment: Methicillin (oxacillin)-resistant Staphylococcus aureus (MRSA). MRSA is predictably resistant to beta-lactam antibiotics (except ceftaroline). Preferred therapy is vancomycin unless clinically contraindicated. Patient requires contact precautions if  hospitalized. CRITICAL RESULT CALLED TO, READ BACK BY AND VERIFIED WITH: [place] [id number]  Methicillin resistance DETECTED (A) NOT DETECTED Final    Comment: CRITICAL RESULT CALLED TO, READ BACK BY AND VERIFIED WITH: PHARMD T BAUMEISTER [id number] MLM    Streptococcus species NOT DETECTED NOT DETECTED Final   Streptococcus agalactiae NOT DETECTED NOT DETECTED Final   Streptococcus pneumoniae NOT DETECTED NOT DETECTED Final   Streptococcus pyogenes NOT DETECTED NOT DETECTED Final   Acinetobacter baumannii NOT DETECTED NOT DETECTED Final   Enterobacteriaceae species NOT DETECTED NOT DETECTED Final   Enterobacter cloacae complex NOT DETECTED NOT DETECTED Final   Escherichia coli NOT DETECTED NOT DETECTED Final   Klebsiella oxytoca NOT DETECTED NOT DETECTED Final   Klebsiella pneumoniae NOT DETECTED NOT DETECTED Final   Proteus species NOT DETECTED NOT DETECTED Final   Serratia marcescens NOT DETECTED NOT DETECTED Final   Haemophilus influenzae NOT DETECTED NOT DETECTED Final   Neisseria meningitidis NOT DETECTED NOT DETECTED Final   Pseudomonas aeruginosa NOT DETECTED NOT DETECTED Final   Candida albicans NOT DETECTED NOT DETECTED Final   Candida glabrata NOT DETECTED NOT DETECTED Final   Candida krusei NOT DETECTED NOT DETECTED Final   Candida parapsilosis NOT DETECTED NOT DETECTED Final   Candida tropicalis NOT DETECTED NOT  DETECTED Final    Comment: Performed at [facility], [place] [address] [place], [place] [address]  Culture, blood (Routine x 2)     Status: Abnormal   Collection Time: 04/03/19  5:06 PM   Specimen: BLOOD RIGHT FOREARM  Result Value Ref Range Status   Specimen Description BLOOD RIGHT FOREARM  Final   Special Requests   Final    BOTTLES DRAWN AEROBIC AND ANAEROBIC Blood Culture results may not be optimal due to an inadequate volume of blood received in culture bottles   Culture  Setup Time   Final    GRAM POSITIVE COCCI IN BOTH AEROBIC AND ANAEROBIC BOTTLES CRITICAL VALUE NOTED.  VALUE IS CONSISTENT WITH PREVIOUSLY REPORTED AND CALLED VALUE.    Culture (A)  Final    STAPHYLOCOCCUS AUREUS SUSCEPTIBILITIES PERFORMED ON PREVIOUS CULTURE WITHIN THE LAST 5 DAYS.    Report Status 04/06/2019 FINAL  Final  Urine culture     Status: Abnormal   Collection Time: 04/03/19  5:14 PM   Specimen: Urine, Random  Result Value Ref Range Status   Specimen Description URINE, RANDOM  Final   Special Requests   Final    NONE Performed at [facility], [address] [place], [place] [address]    Culture (A)  Final    >=100,000 COLONIES/mL KLEBSIELLA PNEUMONIAE >=100,000 COLONIES/mL PROTEUS MIRABILIS    Report Status 04/06/2019 FINAL  Final   Organism ID, Bacteria KLEBSIELLA PNEUMONIAE (A)  Final   Organism ID, Bacteria PROTEUS MIRABILIS (A)  Final      Susceptibility   Klebsiella pneumoniae - MIC*    AMPICILLIN >=32 RESISTANT Resistant     CEFAZOLIN <=4 SENSITIVE Sensitive     CEFTRIAXONE <=1 SENSITIVE Sensitive     CIPROFLOXACIN 1 SENSITIVE Sensitive     GENTAMICIN <=1 SENSITIVE Sensitive     IMIPENEM <=0.25 SENSITIVE Sensitive     NITROFURANTOIN 64 INTERMEDIATE Intermediate     TRIMETH/SULFA >=320 RESISTANT Resistant     AMPICILLIN/SULBACTAM 8 SENSITIVE Sensitive     PIP/TAZO 8 SENSITIVE Sensitive     Extended ESBL POSITIVE Resistant     * >=100,000 COLONIES/mL KLEBSIELLA  PNEUMONIAE   Proteus mirabilis - MIC*    AMPICILLIN <=2 SENSITIVE Sensitive     CEFAZOLIN <=  4 SENSITIVE Sensitive     CEFTRIAXONE <=1 SENSITIVE Sensitive     CIPROFLOXACIN <=0.25 SENSITIVE Sensitive     GENTAMICIN <=1 SENSITIVE Sensitive     IMIPENEM 2 SENSITIVE Sensitive     NITROFURANTOIN 128 RESISTANT Resistant     TRIMETH/SULFA <=20 SENSITIVE Sensitive     AMPICILLIN/SULBACTAM <=2 SENSITIVE Sensitive     PIP/TAZO <=4 SENSITIVE Sensitive     * >=100,000 COLONIES/mL PROTEUS MIRABILIS  SARS Coronavirus 2 [facility] order, Performed in [facility] hospital lab) Nasopharyngeal Nasopharyngeal Swab     Status: None   Collection Time: 04/03/19  5:34 PM   Specimen: Nasopharyngeal Swab  Result Value Ref Range Status   SARS Coronavirus 2 NEGATIVE NEGATIVE Final    Comment: (NOTE) If result is NEGATIVE SARS-CoV-2 target nucleic acids are NOT DETECTED. The SARS-CoV-2 RNA is generally detectable in upper and lower  respiratory specimens during the acute phase of infection. The lowest  concentration of SARS-CoV-2 viral copies this assay can detect is 250  copies / mL. A negative result does not preclude SARS-CoV-2 infection  and should not be used as the sole basis for treatment or other  patient management decisions.  A negative result may occur with  improper specimen collection / handling, submission of specimen other  than nasopharyngeal swab, presence of viral mutation(s) within the  areas targeted by this assay, and inadequate number of viral copies  (<250 copies / mL). A negative result must be combined with clinical  observations, patient history, and epidemiological information. If result is POSITIVE SARS-CoV-2 target nucleic acids are DETECTED. The SARS-CoV-2 RNA is generally detectable in upper and lower  respiratory specimens dur ing the acute phase of infection.  Positive  results are indicative of active infection with SARS-CoV-2.  Clinical  correlation with patient history  and other diagnostic information is  necessary to determine patient infection status.  Positive results do  not rule out bacterial infection or co-infection with other viruses. If result is PRESUMPTIVE POSTIVE SARS-CoV-2 nucleic acids MAY BE PRESENT.   A presumptive positive result was obtained on the submitted specimen  and confirmed on repeat testing.  While 2019 novel coronavirus  (SARS-CoV-2) nucleic acids may be present in the submitted sample  additional confirmatory testing may be necessary for epidemiological  and / or clinical management purposes  to differentiate between  SARS-CoV-2 and other Sarbecovirus currently known to infect humans.  If clinically indicated additional testing with an alternate test  methodology [phone]) is advised. The SARS-CoV-2 RNA is generally  detectable in upper and lower respiratory sp ecimens during the acute  phase of infection. The expected result is Negative. Fact Sheet for Patients:  StrictlyIdeas.no Fact Sheet for Healthcare Providers: BankingDealers.co.za This test is not yet approved or cleared by the [place] FDA and has been authorized for detection and/or diagnosis of SARS-CoV-2 by FDA under an Emergency Use Authorization (EUA).  This EUA will remain in effect (meaning this test can be used) for the duration of the COVID-19 declaration under Section 564(b)(1) of the Act, 21 U.S.C. section 360bbb-3(b)(1), unless the authorization is terminated or revoked sooner. Performed at [facility], [place] [address], [place], [place] [address]   MRSA PCR Screening     Status: Abnormal   Collection Time: 04/03/19  9:45 PM   Specimen: Nasal Mucosa; Nasopharyngeal  Result Value Ref Range Status   MRSA by PCR POSITIVE (A) NEGATIVE Final    Comment:        The  GeneXpert MRSA Assay (FDA approved for NASAL specimens only), is one component of a comprehensive MRSA colonization surveillance  program. It is not intended to diagnose MRSA infection nor to guide or monitor treatment for MRSA infections. RESULT CALLED TO, READ BACK BY AND VERIFIED WITH: Donalda Ewings RN 04/03/19 2333 JDW Performed at [facility] [address], [place], [place] [address]   Culture, blood (routine x 2)     Status: None (Preliminary result)   Collection Time: 04/05/19  7:10 AM   Specimen: BLOOD  Result Value Ref Range Status   Specimen Description BLOOD RIGHT ANTECUBITAL  Final   Special Requests   Final    BOTTLES DRAWN AEROBIC ONLY Blood Culture adequate volume   Culture   Final    NO GROWTH 3 DAYS Performed at [facility], [place] [address] [place], [place] [address]    Report Status PENDING  Incomplete  Culture, blood (routine x 2)     Status: None (Preliminary result)   Collection Time: 04/05/19  7:22 AM   Specimen: BLOOD  Result Value Ref Range Status   Specimen Description BLOOD RIGHT ANTECUBITAL  Final   Special Requests   Final    BOTTLES DRAWN AEROBIC ONLY Blood Culture adequate volume   Culture   Final    NO GROWTH 3 DAYS Performed at [facility], [place] [address], [place], [place] [address]    Report Status PENDING  Incomplete  Aerobic/Anaerobic Culture (surgical/deep wound)     Status: None (Preliminary result)   Collection Time: 04/07/19  2:24 PM   Specimen: Abscess  Result Value Ref Range Status   Specimen Description ABSCESS  Final   Special Requests PARASPINAL ASPIRATE  Final   Gram Stain   Final    ABUNDANT WBC PRESENT, PREDOMINANTLY PMN ABUNDANT GRAM POSITIVE COCCI    Culture   Final    CULTURE REINCUBATED FOR BETTER GROWTH Performed at [facility], [place] [address], [place], [place] [address]    Report Status PENDING  Incomplete         Radiology Studies: Ct Aspiration  Result Date: 04/07/2019 CLINICAL DATA:  Bacteremia. History of spinal cord injury and paraplegia. Discitis-osteomyelitis at L4-5 with epidural phlegmonous changes and a  1.2 x 0.8 cm epidural abscess along the dorsal aspect at the level of L4. Severe spinal stenosis at L4-5 resulting from the phlegmonous changes and facet arthropathy. Infectious myositis of the multifidus muscle posteriorly with right and left intramuscular abscesses. The largest intramuscular abscess in the left multifidus muscle measures 1.7 x 4 x 3.7 cm. EXAM: CT GUIDED NEEDLE ASPIRATE BIOPSY OF PARASPINAL ABSCESS ANESTHESIA/SEDATION: Intravenous Fentanyl 70mcg and Versed 1mg  were administered as conscious sedation during continuous monitoring of the patient's level of consciousness and physiological / cardiorespiratory status by the radiology RN, with a total moderate sedation time of 10 minutes. PROCEDURE: The procedure risks, benefits, and alternatives were explained to the patient. Questions regarding the procedure were encouraged and answered. The patient understands and consents to the procedure. Patient placed prone. Select axial scans through the lumbar region obtained. The dominant left paraspinal collection was localized and an appropriate skin entry site determined and marked. The operative field was prepped with chlorhexidinein a sterile fashion, and a sterile drape was applied covering the operative field. A sterile gown and sterile gloves were used for the procedure. Local anesthesia was provided with 1% Lidocaine. Under CT fluoroscopic guidance, 18 gauge percutaneous entry needle advanced into the  collection. Approximately 16 mL of purulent material were aspirated, sent for Gram stain and culture. Postprocedure scans show no hemorrhage or other apparent complication. The patient tolerated the procedure well. COMPLICATIONS: None immediate FINDINGS: Left paraspinal abscess was again localized. 16 mL purulent material aspirated under CT guidance. IMPRESSION: 1. Technically successful CT-guided paraspinal abscess aspiration; 16 mL aspirate sent for Gram stain and culture. Electronically Signed   By: Lucrezia Europe M.D.   On: 04/07/2019 15:07   [place] Ekg Site Rite  Result Date: 04/07/2019 If Site Rite image not attached, placement could not be confirmed due to current cardiac rhythm.       Scheduled Meds:  sodium chloride   Intravenous Once   baclofen  40 mg Oral TID   busPIRone  10 mg Oral TID   Chlorhexidine Gluconate Cloth  6 each Topical Q0600   diltiazem  60 mg Oral Q8H   enoxaparin (LOVENOX) injection  70 mg Subcutaneous Q12H   ferrous sulfate  325 mg Oral BID WC   FLUoxetine  40 mg Oral Daily   insulin aspart  0-15 Units Subcutaneous TID WC   insulin aspart  0-5 Units Subcutaneous QHS   insulin aspart  4 Units Subcutaneous TID WC   insulin glargine  15 Units Subcutaneous Daily   linaclotide  145 mcg Oral QAC breakfast   mupirocin ointment   Topical Daily   traZODone  150 mg Oral QHS   Continuous Infusions:  sodium chloride 125 mL/hr at 04/08/19 [id number]   vancomycin 1,250 mg (04/07/19 1815)     LOS: 5 days   Time spent= 35 mins    Marlisha Vanwyk Arsenio Loader, MD [facility]  If 7PM-7AM, please contact night-coverage [email or web address] 04/08/2019, 2:47 PM

## 2019-04-08 NOTE — Progress Notes (Signed)
   04/08/19 2208  MEWS Score  Resp 18  Pulse Rate 79  BP 99/63  Temp 98 F (36.7 C)  SpO2 94 %  MEWS Score  MEWS RR 0  MEWS Pulse 0  MEWS Systolic 1  MEWS LOC 0  MEWS Temp 0  MEWS Score 1  MEWS Score Color Green  MEWS Assessment  Is this an acute change? No  MEWS Guidelines - (patients age 56 and over)  Red - At High Risk for Deterioration Yellow - At risk for Deterioration  1. Go to room and assess patient 2. Validate data. Is this patient's baseline? If data confirmed: 3. Is this an acute change? 4. Administer prn meds/treatments as ordered. 5. Note Sepsis score 6. Review goals of care 7. Sports coach, RRT nurse and Provider. 8. Ask Provider to come to bedside.  9. Document patient condition/interventions/response. 10. Increase frequency of vital signs and focused assessments to at least q15 minutes x 4, then q30 minutes x2. - If stable, then q1h x3, then q4h x3 and then q8h or dept. routine. - If unstable, contact Provider & RRT nurse. Prepare for possible transfer. 11. Add entry in progress notes using the smart phrase ".MEWS". 1. Go to room and assess patient 2. Validate data. Is this patient's baseline? If data confirmed: 3. Is this an acute change? 4. Administer prn meds/treatments as ordered? 5. Note Sepsis score 6. Review goals of care 7. Sports coach and Provider 8. Call RRT nurse as needed. 9. Document patient condition/interventions/response. 10. Increase frequency of vital signs and focused assessments to at least q2h x2. - If stable, then q4h x2 and then q8h or dept. routine. - If unstable, contact Provider & RRT nurse. Prepare for possible transfer. 11. Add entry in progress notes using the smart phrase ".MEWS".  Green - Likely stable Lavender - Comfort Care Only  1. Continue routine/ordered monitoring.  2. Review goals of care. 1. Continue routine/ordered monitoring. 2. Review goals of care.

## 2019-04-08 NOTE — Progress Notes (Signed)
Baclofen and trazodone held earlier due to pt being very sleepy. Pt easy to arouse and he stays awake, but goes to sleep right after conversation is over.  At 0010 pt fully awake, alert and oriented, c/o back pain. Baclofen and percocet given to pt.  Will continue to monitor pt.

## 2019-04-08 NOTE — Progress Notes (Signed)
IV RN went to change patient PICC dressing per IV consult.  IV RN introduced self and explained procedure for dressing change. Patient stated "aint shit wrong with my dressing lady" IV RN attempted to educate patient on the importance of dressing change. Patient biopatch dressing saturated with blood.  Patient replied " look you black bitch you aint touching me and get the fuck away from me"  IV RN asked patient not to call her names and that she was here to help him. Patient replied "you nigger get the hell away from me" Charge RN called to the room and attempted to educate patient on the importance of dressing change.  Patient replied to Charge Nurse "aint nobody been in here to clean me up and wash my hair, I will just go to another hospital"  Charge RN told patient he can not talk to staff disrespectfully.  Patient looked at IV RN and stated " I apologize, I have a antisocial disorder but you still aint touching nor changing my dressing.  IV RN left patient room with Charge RN still at patient bedside.

## 2019-04-09 DIAGNOSIS — F319 Bipolar disorder, unspecified: Secondary | ICD-10-CM

## 2019-04-09 DIAGNOSIS — M4620 Osteomyelitis of vertebra, site unspecified: Secondary | ICD-10-CM

## 2019-04-09 DIAGNOSIS — G894 Chronic pain syndrome: Secondary | ICD-10-CM

## 2019-04-09 DIAGNOSIS — F4010 Social phobia, unspecified: Secondary | ICD-10-CM

## 2019-04-09 DIAGNOSIS — L02212 Cutaneous abscess of back [any part, except buttock]: Secondary | ICD-10-CM

## 2019-04-09 DIAGNOSIS — N319 Neuromuscular dysfunction of bladder, unspecified: Secondary | ICD-10-CM

## 2019-04-09 DIAGNOSIS — Z884 Allergy status to anesthetic agent status: Secondary | ICD-10-CM

## 2019-04-09 LAB — CBC WITH DIFFERENTIAL/PLATELET
Abs Immature Granulocytes: 0.21 10*3/uL — ABNORMAL HIGH (ref 0.00–0.07)
Basophils Absolute: 0 10*3/uL (ref 0.0–0.1)
Basophils Relative: 0 %
Eosinophils Absolute: 0.1 10*3/uL (ref 0.0–0.5)
Eosinophils Relative: 1 %
HCT: 27.7 % — ABNORMAL LOW (ref 39.0–52.0)
Hemoglobin: 8 g/dL — ABNORMAL LOW (ref 13.0–17.0)
Immature Granulocytes: 2 %
Lymphocytes Relative: 10 %
Lymphs Abs: 1.1 10*3/uL (ref 0.7–4.0)
MCH: 22 pg — ABNORMAL LOW (ref 26.0–34.0)
MCHC: 28.9 g/dL — ABNORMAL LOW (ref 30.0–36.0)
MCV: 76.1 fL — ABNORMAL LOW (ref 80.0–100.0)
Monocytes Absolute: 0.7 10*3/uL (ref 0.1–1.0)
Monocytes Relative: 7 %
Neutro Abs: 8.4 10*3/uL — ABNORMAL HIGH (ref 1.7–7.7)
Neutrophils Relative %: 80 %
Platelets: 407 10*3/uL — ABNORMAL HIGH (ref 150–400)
RBC: 3.64 MIL/uL — ABNORMAL LOW (ref 4.22–5.81)
RDW: 23.6 % — ABNORMAL HIGH (ref 11.5–15.5)
WBC: 10.6 10*3/uL — ABNORMAL HIGH (ref 4.0–10.5)
nRBC: 0 % (ref 0.0–0.2)

## 2019-04-09 LAB — BASIC METABOLIC PANEL
Anion gap: 6 (ref 5–15)
BUN: 9 mg/dL (ref 6–20)
CO2: 24 mmol/L (ref 22–32)
Calcium: 8.1 mg/dL — ABNORMAL LOW (ref 8.9–10.3)
Chloride: 104 mmol/L (ref 98–111)
Creatinine, Ser: 0.65 mg/dL (ref 0.61–1.24)
GFR calc Af Amer: >60 mL/min (ref >60)
GFR calc non Af Amer: >60 mL/min (ref >60)
Glucose, Bld: 225 mg/dL — ABNORMAL HIGH (ref 70–99)
Potassium: 3.9 mmol/L (ref 3.5–5.1)
Sodium: 134 mmol/L — ABNORMAL LOW (ref 135–145)

## 2019-04-09 LAB — GLUCOSE, CAPILLARY
Glucose-Capillary: 226 mg/dL — ABNORMAL HIGH (ref 70–99)
Glucose-Capillary: 191 mg/dL — ABNORMAL HIGH (ref 70–99)
Glucose-Capillary: 215 mg/dL — ABNORMAL HIGH (ref 70–99)
Glucose-Capillary: 254 mg/dL — ABNORMAL HIGH (ref 70–99)

## 2019-04-09 LAB — MAGNESIUM: Magnesium: 1.8 mg/dL (ref 1.7–2.4)

## 2019-04-09 MED ORDER — NICOTINE 14 MG/24HR TD PT24
14.0000 mg | MEDICATED_PATCH | Freq: Every day | TRANSDERMAL | Status: DC
  Administered 2019-04-09 – 2019-04-12 (×4): 14 mg via TRANSDERMAL
  Filled 2019-04-09 (×4): qty 1

## 2019-04-09 MED ORDER — INSULIN GLARGINE 100 UNIT/ML ~~LOC~~ SOLN
20.0000 [IU] | Freq: Every day | SUBCUTANEOUS | Status: DC
  Administered 2019-04-10: 20 [IU] via SUBCUTANEOUS
  Filled 2019-04-09 (×2): qty 0.2

## 2019-04-09 NOTE — Progress Notes (Signed)
Patient ID: Clarence Sims, male   DOB: [id number], 58 y.o.   MRN: [id number]         [facility] for Infectious Disease  Date of Admission:  04/03/2019   Total days of antibiotics 7        Day 2 oritavancin         ASSESSMENT: He has recurrent MRSA bacteremia, most likely coming from his upper back abscess.  I would recommend asking general surgery to evaluate for bedside I&D.  He also has recurrent vertebral infection.  I have talked to him again about our recommendation that he stay in a supervised setting to complete at least 6 weeks of IV antibiotic therapy but he says he will not do that.  If he does leave AMA we will prescribe oral trimethoprim sulfamethoxazole to complete therapy.  PLAN: 1. He is currently on long-acting oritavancin 2. Recommend general surgery evaluation  Principal Problem:   MRSA bacteremia Active Problems:   Paraplegia following spinal cord injury ([place])   Neurogenic bladder   Sacral decubitus ulcer, stage IV (HCC)   Microcytic anemia   Hx of discitis   Polysubstance abuse (HCC)   Chronic pain syndrome   Scheduled Meds: . sodium chloride   Intravenous Once  . baclofen  40 mg Oral TID  . busPIRone  10 mg Oral TID  . Chlorhexidine Gluconate Cloth  6 each Topical Q0600  . diltiazem  60 mg Oral Q8H  . enoxaparin (LOVENOX) injection  70 mg Subcutaneous Q12H  . ferrous sulfate  325 mg Oral BID WC  . FLUoxetine  40 mg Oral Daily  . insulin aspart  0-15 Units Subcutaneous TID WC  . insulin aspart  0-5 Units Subcutaneous QHS  . insulin aspart  4 Units Subcutaneous TID WC  . insulin glargine  15 Units Subcutaneous Daily  . linaclotide  145 mcg Oral QAC breakfast  . multivitamin with minerals  1 tablet Oral Daily  . mupirocin ointment   Topical Daily  . nutrition supplement (JUVEN)  1 packet Oral BID BM  . nystatin  5 mL Oral QID  . Ensure Max Protein  11 oz Oral Daily  . traZODone  150 mg Oral QHS   Continuous Infusions: . sodium chloride 125  mL/hr at 04/09/19 0630   PRN Meds:.acetaminophen, LORazepam, morphine injection, ondansetron (ZOFRAN) IV, oxyCODONE-acetaminophen, polyethylene glycol, promethazine, senna-docusate, sodium chloride flush   SUBJECTIVE: He states that his back pain is unchanged.  He tells me that because of his antisocial disorder and bipolar disorder staying in the hospital.  He said that he would stay until Friday and then he would be leaving [place].  Review of Systems: Review of Systems  Constitutional: Positive for diaphoresis. Negative for chills and fever.  Gastrointestinal: Negative for abdominal pain, diarrhea, nausea and vomiting.  Musculoskeletal: Positive for back pain.    Allergies  Allergen Reactions  . Novocain [Procaine] Other (See Comments)    Unknown reaction    OBJECTIVE: Vitals:   04/08/19 2208 04/09/19 0328 04/09/19 0707 04/09/19 0951  BP: 99/63 121/76 (!) 86/60 98/67  Pulse: 79 99 75 89  Resp: 18 16 19 19   Temp: 98 F (36.7 C) 98.5 F (36.9 C) (!) 97.5 F (36.4 C) 97.7 F (36.5 C)  TempSrc:    Oral  SpO2: 94% 100% 97% 96%  Weight:      Height:       Body mass index is 27.78 kg/m.  Physical Exam Constitutional:  Comments: He is groggy with slightly slurred speech.  Cardiovascular:     Rate and Rhythm: Normal rate and regular rhythm.     Heart sounds: No murmur.  Pulmonary:     Effort: Pulmonary effort is normal.     Breath sounds: Normal breath sounds.  Skin:    Comments: He is still having a large amount of purulent drainage from his upper back abscess.  His sacral wound is covered.     Lab Results Lab Results  Component Value Date   WBC 10.6 (H) 04/09/2019   HGB 8.0 (L) 04/09/2019   HCT 27.7 (L) 04/09/2019   MCV 76.1 (L) 04/09/2019   PLT 407 (H) 04/09/2019    Lab Results  Component Value Date   CREATININE 0.65 04/09/2019   BUN 9 04/09/2019   NA 134 (L) 04/09/2019   K 3.9 04/09/2019   CL 104 04/09/2019   CO2 24 04/09/2019     Lab Results  Component Value Date   ALT 11 04/03/2019   AST 11 (L) 04/03/2019   ALKPHOS 89 04/03/2019   BILITOT 0.5 04/03/2019     Microbiology: Recent Results (from the past 240 hour(s))  Culture, blood (Routine x 2)     Status: Abnormal   Collection Time: 04/03/19  3:30 PM   Specimen: BLOOD  Result Value Ref Range Status   Specimen Description BLOOD RIGHT ANTECUBITAL  Final   Special Requests   Final    BOTTLES DRAWN AEROBIC AND ANAEROBIC Blood Culture results may not be optimal due to an inadequate volume of blood received in culture bottles   Culture  Setup Time   Final    IN BOTH AEROBIC AND ANAEROBIC BOTTLES GRAM POSITIVE COCCI Organism ID to follow CRITICAL RESULT CALLED TO, READ BACK BY AND VERIFIED WITH: Milford Cage [id number] MLM Performed at [facility], [address], [place], [place] [address]    Culture METHICILLIN RESISTANT STAPHYLOCOCCUS AUREUS (A)  Final   Report Status 04/06/2019 FINAL  Final   Organism ID, Bacteria METHICILLIN RESISTANT STAPHYLOCOCCUS AUREUS  Final      Susceptibility   Methicillin resistant staphylococcus aureus - MIC*    CIPROFLOXACIN >=8 RESISTANT Resistant     ERYTHROMYCIN >=8 RESISTANT Resistant     GENTAMICIN <=0.5 SENSITIVE Sensitive     OXACILLIN >=4 RESISTANT Resistant     TETRACYCLINE >=16 RESISTANT Resistant     VANCOMYCIN 1 SENSITIVE Sensitive     TRIMETH/SULFA <=10 SENSITIVE Sensitive     CLINDAMYCIN >=8 RESISTANT Resistant     RIFAMPIN <=0.5 SENSITIVE Sensitive     Inducible Clindamycin NEGATIVE Sensitive     * METHICILLIN RESISTANT STAPHYLOCOCCUS AUREUS  Blood Culture ID Panel (Reflexed)     Status: Abnormal   Collection Time: 04/03/19  3:30 PM  Result Value Ref Range Status   Enterococcus species NOT DETECTED NOT DETECTED Final   Listeria monocytogenes NOT DETECTED NOT DETECTED Final   Staphylococcus species DETECTED (A) NOT DETECTED Final    Comment: CRITICAL RESULT CALLED TO, READ BACK BY AND  VERIFIED WITH: PHARMD T BAUMEISTER [id number] MLM    Staphylococcus aureus (BCID) DETECTED (A) NOT DETECTED Final    Comment: Methicillin (oxacillin)-resistant Staphylococcus aureus (MRSA). MRSA is predictably resistant to beta-lactam antibiotics (except ceftaroline). Preferred therapy is vancomycin unless clinically contraindicated. Patient requires contact precautions if  hospitalized. CRITICAL RESULT CALLED TO, READ BACK BY AND VERIFIED WITH: PHARMD T BAUMEISTER [id number] MLM    Methicillin resistance DETECTED (A)  NOT DETECTED Final    Comment: CRITICAL RESULT CALLED TO, READ BACK BY AND VERIFIED WITH: PHARMD T BAUMEISTER [id number] MLM    Streptococcus species NOT DETECTED NOT DETECTED Final   Streptococcus agalactiae NOT DETECTED NOT DETECTED Final   Streptococcus pneumoniae NOT DETECTED NOT DETECTED Final   Streptococcus pyogenes NOT DETECTED NOT DETECTED Final   Acinetobacter baumannii NOT DETECTED NOT DETECTED Final   Enterobacteriaceae species NOT DETECTED NOT DETECTED Final   Enterobacter cloacae complex NOT DETECTED NOT DETECTED Final   Escherichia coli NOT DETECTED NOT DETECTED Final   Klebsiella oxytoca NOT DETECTED NOT DETECTED Final   Klebsiella pneumoniae NOT DETECTED NOT DETECTED Final   Proteus species NOT DETECTED NOT DETECTED Final   Serratia marcescens NOT DETECTED NOT DETECTED Final   Haemophilus influenzae NOT DETECTED NOT DETECTED Final   Neisseria meningitidis NOT DETECTED NOT DETECTED Final   Pseudomonas aeruginosa NOT DETECTED NOT DETECTED Final   Candida albicans NOT DETECTED NOT DETECTED Final   Candida glabrata NOT DETECTED NOT DETECTED Final   Candida krusei NOT DETECTED NOT DETECTED Final   Candida parapsilosis NOT DETECTED NOT DETECTED Final   Candida tropicalis NOT DETECTED NOT DETECTED Final    Comment: Performed at [facility], [place]. [address] [place], [place] [address]  Culture, blood (Routine x 2)     Status: Abnormal   Collection  Time: 04/03/19  5:06 PM   Specimen: BLOOD RIGHT FOREARM  Result Value Ref Range Status   Specimen Description BLOOD RIGHT FOREARM  Final   Special Requests   Final    BOTTLES DRAWN AEROBIC AND ANAEROBIC Blood Culture results may not be optimal due to an inadequate volume of blood received in culture bottles   Culture  Setup Time   Final    GRAM POSITIVE COCCI IN BOTH AEROBIC AND ANAEROBIC BOTTLES CRITICAL VALUE NOTED.  VALUE IS CONSISTENT WITH PREVIOUSLY REPORTED AND CALLED VALUE.    Culture (A)  Final    STAPHYLOCOCCUS AUREUS SUSCEPTIBILITIES PERFORMED ON PREVIOUS CULTURE WITHIN THE LAST 5 DAYS.    Report Status 04/06/2019 FINAL  Final  Urine culture     Status: Abnormal   Collection Time: 04/03/19  5:14 PM   Specimen: Urine, Random  Result Value Ref Range Status   Specimen Description URINE, RANDOM  Final   Special Requests   Final    NONE Performed at [facility], [address], [place], [place] [address]    Culture (A)  Final    >=100,000 COLONIES/mL KLEBSIELLA PNEUMONIAE >=100,000 COLONIES/mL PROTEUS MIRABILIS    Report Status 04/06/2019 FINAL  Final   Organism ID, Bacteria KLEBSIELLA PNEUMONIAE (A)  Final   Organism ID, Bacteria PROTEUS MIRABILIS (A)  Final      Susceptibility   Klebsiella pneumoniae - MIC*    AMPICILLIN >=32 RESISTANT Resistant     CEFAZOLIN <=4 SENSITIVE Sensitive     CEFTRIAXONE <=1 SENSITIVE Sensitive     CIPROFLOXACIN 1 SENSITIVE Sensitive     GENTAMICIN <=1 SENSITIVE Sensitive     IMIPENEM <=0.25 SENSITIVE Sensitive     NITROFURANTOIN 64 INTERMEDIATE Intermediate     TRIMETH/SULFA >=320 RESISTANT Resistant     AMPICILLIN/SULBACTAM 8 SENSITIVE Sensitive     PIP/TAZO 8 SENSITIVE Sensitive     Extended ESBL POSITIVE Resistant     * >=100,000 COLONIES/mL KLEBSIELLA PNEUMONIAE   Proteus mirabilis - MIC*    AMPICILLIN <=2 SENSITIVE Sensitive     CEFAZOLIN <=4 SENSITIVE Sensitive  CEFTRIAXONE <=1 SENSITIVE Sensitive     CIPROFLOXACIN  <=0.25 SENSITIVE Sensitive     GENTAMICIN <=1 SENSITIVE Sensitive     IMIPENEM 2 SENSITIVE Sensitive     NITROFURANTOIN 128 RESISTANT Resistant     TRIMETH/SULFA <=20 SENSITIVE Sensitive     AMPICILLIN/SULBACTAM <=2 SENSITIVE Sensitive     PIP/TAZO <=4 SENSITIVE Sensitive     * >=100,000 COLONIES/mL PROTEUS MIRABILIS  SARS Coronavirus 2 [facility] order, Performed in [facility] hospital lab) Nasopharyngeal Nasopharyngeal Swab     Status: None   Collection Time: 04/03/19  5:34 PM   Specimen: Nasopharyngeal Swab  Result Value Ref Range Status   SARS Coronavirus 2 NEGATIVE NEGATIVE Final    Comment: (NOTE) If result is NEGATIVE SARS-CoV-2 target nucleic acids are NOT DETECTED. The SARS-CoV-2 RNA is generally detectable in upper and lower  respiratory specimens during the acute phase of infection. The lowest  concentration of SARS-CoV-2 viral copies this assay can detect is 250  copies / mL. A negative result does not preclude SARS-CoV-2 infection  and should not be used as the sole basis for treatment or other  patient management decisions.  A negative result may occur with  improper specimen collection / handling, submission of specimen other  than nasopharyngeal swab, presence of viral mutation(s) within the  areas targeted by this assay, and inadequate number of viral copies  (<250 copies / mL). A negative result must be combined with clinical  observations, patient history, and epidemiological information. If result is POSITIVE SARS-CoV-2 target nucleic acids are DETECTED. The SARS-CoV-2 RNA is generally detectable in upper and lower  respiratory specimens dur ing the acute phase of infection.  Positive  results are indicative of active infection with SARS-CoV-2.  Clinical  correlation with patient history and other diagnostic information is  necessary to determine patient infection status.  Positive results do  not rule out bacterial infection or co-infection with other viruses.  If result is PRESUMPTIVE POSTIVE SARS-CoV-2 nucleic acids MAY BE PRESENT.   A presumptive positive result was obtained on the submitted specimen  and confirmed on repeat testing.  While 2019 novel coronavirus  (SARS-CoV-2) nucleic acids may be present in the submitted sample  additional confirmatory testing may be necessary for epidemiological  and / or clinical management purposes  to differentiate between  SARS-CoV-2 and other Sarbecovirus currently known to infect humans.  If clinically indicated additional testing with an alternate test  methodology [phone]) is advised. The SARS-CoV-2 RNA is generally  detectable in upper and lower respiratory sp ecimens during the acute  phase of infection. The expected result is Negative. Fact Sheet for Patients:  StrictlyIdeas.no Fact Sheet for Healthcare Providers: BankingDealers.co.za This test is not yet approved or cleared by the [place] FDA and has been authorized for detection and/or diagnosis of SARS-CoV-2 by FDA under an Emergency Use Authorization (EUA).  This EUA will remain in effect (meaning this test can be used) for the duration of the COVID-19 declaration under Section 564(b)(1) of the Act, 21 U.S.C. section 360bbb-3(b)(1), unless the authorization is terminated or revoked sooner. Performed at [facility], [place] [address], [place], [place] [address]   MRSA PCR Screening     Status: Abnormal   Collection Time: 04/03/19  9:45 PM   Specimen: Nasal Mucosa; Nasopharyngeal  Result Value Ref Range Status   MRSA by PCR POSITIVE (A) NEGATIVE Final    Comment:        The GeneXpert MRSA Assay (FDA approved for  NASAL specimens only), is one component of a comprehensive MRSA colonization surveillance program. It is not intended to diagnose MRSA infection nor to guide or monitor treatment for MRSA infections. RESULT CALLED TO, READ BACK BY AND VERIFIED WITH: Donalda Ewings RN  04/03/19 2333 JDW Performed at [facility] [address], [place], [place] [address]   Culture, blood (routine x 2)     Status: None (Preliminary result)   Collection Time: 04/05/19  7:10 AM   Specimen: BLOOD  Result Value Ref Range Status   Specimen Description BLOOD RIGHT ANTECUBITAL  Final   Special Requests   Final    BOTTLES DRAWN AEROBIC ONLY Blood Culture adequate volume   Culture   Final    NO GROWTH 3 DAYS Performed at [facility], [place] [address] [place], [place] [address]    Report Status PENDING  Incomplete  Culture, blood (routine x 2)     Status: None (Preliminary result)   Collection Time: 04/05/19  7:22 AM   Specimen: BLOOD  Result Value Ref Range Status   Specimen Description BLOOD RIGHT ANTECUBITAL  Final   Special Requests   Final    BOTTLES DRAWN AEROBIC ONLY Blood Culture adequate volume   Culture   Final    NO GROWTH 3 DAYS Performed at [facility], [place] [address] [place], [place] [address]    Report Status PENDING  Incomplete  Aerobic/Anaerobic Culture (surgical/deep wound)     Status: None (Preliminary result)   Collection Time: 04/07/19  2:24 PM   Specimen: Abscess  Result Value Ref Range Status   Specimen Description ABSCESS  Final   Special Requests PARASPINAL ASPIRATE  Final   Gram Stain   Final    ABUNDANT WBC PRESENT, PREDOMINANTLY PMN ABUNDANT GRAM POSITIVE COCCI    Culture   Final    CULTURE REINCUBATED FOR BETTER GROWTH Performed at [facility], [place] [address] [place], [place] [address]    Report Status PENDING  Incomplete    Michel Bickers, MD [facility] [place] [facility] [phone] pager   [phone] cell 04/09/2019, 11:10 AM

## 2019-04-09 NOTE — Progress Notes (Signed)
Responded to call from patient that "I can't catch my breath."  Pt found to be in acute respiratory distress.  Raised patient to a sitting position, placed nasal cannula at 2L.  Vitals BP 121/76 (89), T 98.5, P 99, RR 16, SpO2 100% on 2L [place].  Paged [facility] provider Tylene Fantasia, page acknowledged.  While speaking with Baltazar Najjar, pt's respiratory status improved.  No new orders received at this time, will continue to monitor.

## 2019-04-09 NOTE — Progress Notes (Signed)
Inpatient Diabetes Program Recommendations  AACE/ADA: New Consensus Statement on Inpatient Glycemic Control (2015)  Target Ranges:  Prepandial:   less than 140 mg/dL      Peak postprandial:   less than 180 mg/dL (1-2 hours)      Critically ill patients:  140 - 180 mg/dL   Lab Results  Component Value Date   GLUCAP 226 (H) 04/09/2019   HGBA1C 8.0 (H) 04/04/2019    Review of Glycemic Control Results for Clarence Sims, Clarence Sims (MRN [id number]) as of 04/09/2019 10:56  Ref. Range 04/08/2019 08:02 04/08/2019 12:56 04/08/2019 16:34 04/08/2019 21:06 04/09/2019 08:22  Glucose-Capillary Latest Ref Range: 70 - 99 mg/dL 232 (H) 212 (H) 114 (H) 345 (H) 226 (H)   Diabetes history: DM2 Outpatient Diabetes medications: Lantis 40 units + Metformin 1 gm bid Current orders for Inpatient glycemic control: Lantus 15 units + Novolog 4 units tid + Novolog moderate correction tid + hs 0-5 units  Inpatient Diabetes Program Recommendations:   -Increase Lantus to 50% home dose = 20 units  Thank you, Bethena Roys E. Dwayne Begay, RN, MSN, CDE  Diabetes Coordinator Inpatient Glycemic Control Team Team Pager [phone] (8am-5pm) 04/09/2019 10:57 AM

## 2019-04-09 NOTE — Progress Notes (Signed)
Changed sacral dressing and dressings on wounds on legs and abdomen.
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
## 2019-04-09 NOTE — Progress Notes (Signed)
Patient ID: Clarence Sims, male   DOB: [id number], 58 y.o.   MRN: [id number]  PROGRESS NOTE    Clarence Sims  [id number] DOB: [birth year] DOA: 04/03/2019 PCP: Nicoletta Dress, MD   Brief Narrative:  58 year old male with history of paraplegia following spinal cord injury/infarction, chronic low back pain, history of osteomyelitis, pressure ulcers, prostate cancer, chronic indwelling Foley catheter which was changed about 2 weeks ago presented on 04/03/2019 with fevers and chills.  He was admitted for sepsis with possible urosepsis and infected sebaceous cyst on the back.  He was started on IV antibiotics Rocephin and vancomycin.  He was found to have MRSA bacteremia.  ID was consulted.  Rocephin was subsequently discontinued.  MRI of the spine was positive for epidural/paraspinal abscess for which IR was consulted for drain/biopsy which patient initially refused but subsequently underwent CT-guided aspiration.  Echo showed EF of 50 to 55% without any vegetation.  Assessment & Plan:   MRSA bacteremia Sepsis: Present on admission Epidural and paraspinal abscess Upper back abscess/infected sebaceous cyst -Blood cultures on admission is positive for MRSA.  Repeat blood cultures from 04/05/2019 is negative so far. -MRI of the spine showed vertebral osteo-, epidural and paraspinal abscess. Status post CT-guided paraspinal abscess aspiration by IR on 04/07/2019.  Cultures pending so far. -ID following.  He received oritavancin on 04/08/2019.  Was on IV vancomycin till 04/07/2019.  Recommend general surgery consultation for possible I&D of back abscess.  I have called and consulted general surgery.  ID is recommending at least 6 weeks of IV antibiotic therapy.  Patient threatening to leave AMA. -2D echo showed EF of 50 to 55% with no vegetation. -PICC line placed on 04/07/2019.  Episode of seizure  -EEG on 04/08/2019 showed no evidence of seizures. -MRI brain-no focal neuro deficits or changes  seen in the brain, no stroke.  Does have chronic white matter changes.  Hyponatremia -Secondary to intravascular volume depletion.  Improving.  DC IV fluids.  History of paraplegia with neurogenic bladder with chronic indwelling Foley catheter -Foley catheter was changed during this admission.  Outpatient follow-up.  Anemia of chronic disease and microcytic anemia -Status post 1 unit packed red cells transfusion on 04/05/2019.  Hemoglobin stable.  Monitor  History of hepatitis C  -Unknown if this was treated in the past  History of chronic pain and illicit drug use/substance abuse -Continue pain management along with bowel regimen  Diabetes mellitus type 2 uncontrolled with hyperglycemia -Increase Lantus to 20 units daily along with NovoLog and CBGs with SSI.  Bipolar disorder -Continue BuSpar, fluoxetine and trazodone.  Outpatient follow-up  DVT prophylaxis: Therapeutic dose of Lovenox Code Status: Full Family Communication: None at bedside Disposition Plan: Might have to remain inpatient for 6 weeks for IV antibiotics  Consultants: ID/general surgery/IR  Procedures: CT-guided paraspinal abscess drainage on 04/07/2019 Echo on 04/05/2019 IMPRESSIONS    1. The left ventricle has low normal systolic function, with an ejection fraction of 50-55%. The cavity size was normal. Left ventricular diastolic Doppler parameters are consistent with impaired relaxation.  2. The right ventricle has normal systolic function. The cavity was normal. There is no increase in right ventricular wall thickness. Right ventricular systolic pressure is moderately elevated with an estimated pressure of 50.6 mmHg.  3. There is mild mitral annular calcification present. No evidence of mitral valve stenosis.  4. Tricuspid valve regurgitation is moderate.  5. No stenosis of the aortic valve.  6. The aorta is normal unless otherwise noted.  SUMMARY   No evidence for a vegetation.  Antimicrobials:    Anti-infectives (From admission, onward)   Start     Dose/Rate Route Frequency Ordered Stop   04/08/19 1600  Oritavancin Diphosphate (ORBACTIV) 1,200 mg in dextrose 5 % IVPB     1,200 mg 333.3 mL/hr over 180 Minutes Intravenous Once 04/08/19 1500 04/08/19 2121   04/04/19 1830  vancomycin (VANCOCIN) 1,250 mg in sodium chloride 0.9 % 250 mL IVPB  Status:  Discontinued     1,250 mg 166.7 mL/hr over 90 Minutes Intravenous Every 24 hours 04/03/19 2206 04/08/19 1453   04/03/19 2245  vancomycin (VANCOCIN) 500 mg in sodium chloride 0.9 % 100 mL IVPB     500 mg 100 mL/hr over 60 Minutes Intravenous NOW 04/03/19 2205 04/04/19 0119   04/03/19 2145  cefTRIAXone (ROCEPHIN) 2 g in sodium chloride 0.9 % 100 mL IVPB  Status:  Discontinued     2 g 200 mL/hr over 30 Minutes Intravenous Every 24 hours 04/03/19 2142 04/04/19 1236   04/03/19 1715  ceFEPIme (MAXIPIME) 2 g in sodium chloride 0.9 % 100 mL IVPB     2 g 200 mL/hr over 30 Minutes Intravenous  Once 04/03/19 1714 04/03/19 1845   04/03/19 1715  vancomycin (VANCOCIN) IVPB 1000 mg/200 mL premix     1,000 mg 200 mL/hr over 60 Minutes Intravenous  Once 04/03/19 1714 04/03/19 2046       Subjective: Patient seen and examined at bedside.  He denies any overnight fever, nausea or vomiting.  He wants to go home.  Poor historian Objective: Vitals:   04/09/19 0328 04/09/19 0707 04/09/19 0951 04/09/19 1212  BP: 121/76 (!) 86/60 98/67 116/73  Pulse: 99 75 89 90  Resp: 16 19 19 20   Temp: 98.5 F (36.9 C) (!) 97.5 F (36.4 C) 97.7 F (36.5 C) 97.6 F (36.4 C)  TempSrc:   Oral Oral  SpO2: 100% 97% 96% 96%  Weight:      Height:        Intake/Output Summary (Last 24 hours) at 04/09/2019 1328 Last data filed at 04/09/2019 0700 Gross per 24 hour  Intake 2259.08 ml  Output 2750 ml  Net -490.92 ml   Filed Weights   04/03/19 1642 04/04/19 0600  Weight: 68 kg 68.9 kg    Examination:  General exam: Appears calm and comfortable.  Looks older than  stated age.  Chronically ill. Respiratory system: Bilateral decreased breath sounds at bases Cardiovascular system: S1 & S2 heard, Rate controlled Gastrointestinal system: Abdomen is nondistended, soft and nontender. Normal bowel sounds heard. Extremities: No cyanosis, clubbing, edema    Data Reviewed: I have personally reviewed following labs and imaging studies  CBC: Recent Labs  Lab 04/03/19 1706  04/06/19 0210 04/06/19 2323 04/07/19 1111 04/08/19 0328 04/09/19 0540  WBC 15.4*   < > 11.5* 8.8 10.4 8.8 10.6*  NEUTROABS 13.1*  --   --   --   --   --  8.4*  HGB 8.5*   < > 7.7* 6.5* 8.8* 8.2* 8.0*  HCT 30.3*   < > 26.1* 21.1* 28.9* 28.4* 27.7*  MCV 69.5*   < > 71.1* 67.8* 72.8* 75.5* 76.1*  PLT 511*   < > 375 327 409* 421* 407*   < > = values in this interval not displayed.   Basic Metabolic Panel: Recent Labs  Lab 04/03/19 2252  04/05/19 0726 04/06/19 0210 04/07/19 1111 04/08/19 0328 04/09/19 0540  NA  --    < >  137 136 138 137 134*  K  --    < > 4.0 4.2 4.3 3.9 3.9  CL  --    < > 106 106 105 104 104  CO2  --    < > 22 22 24 24 24   GLUCOSE  --    < > 184* 190* 164* 290* 225*  BUN  --    < > 8 9 8 10 9   CREATININE 0.75   < > 0.72 0.88 0.72 0.77 0.65  CALCIUM  --    < > 8.2* 8.0* 8.6* 8.5* 8.1*  MG 1.9  --  2.1 2.0 1.9 2.0 1.8  PHOS 4.2  --   --   --   --   --   --    < > = values in this interval not displayed.   GFR: Estimated Creatinine Clearance: 87.9 mL/min (by C-G formula based on SCr of 0.65 mg/dL). Liver Function Tests: Recent Labs  Lab 04/03/19 1706  AST 11*  ALT 11  ALKPHOS 89  BILITOT 0.5  PROT 7.8  ALBUMIN 2.6*   No results for input(s): LIPASE, AMYLASE in the last 168 hours. No results for input(s): AMMONIA in the last 168 hours. Coagulation Profile: Recent Labs  Lab 04/03/19 1706  INR 1.2   Cardiac Enzymes: No results for input(s): CKTOTAL, CKMB, CKMBINDEX, TROPONINI in the last 168 hours. BNP (last 3 results) No results for input(s):  PROBNP in the last 8760 hours. HbA1C: No results for input(s): HGBA1C in the last 72 hours. CBG: Recent Labs  Lab 04/08/19 1256 04/08/19 1634 04/08/19 2106 04/09/19 0822 04/09/19 1210  GLUCAP 212* 114* 345* 226* 191*   Lipid Profile: No results for input(s): CHOL, HDL, LDLCALC, TRIG, CHOLHDL, LDLDIRECT in the last 72 hours. Thyroid Function Tests: No results for input(s): TSH, T4TOTAL, FREET4, T3FREE, THYROIDAB in the last 72 hours. Anemia Panel: No results for input(s): VITAMINB12, FOLATE, FERRITIN, TIBC, IRON, RETICCTPCT in the last 72 hours. Sepsis Labs: Recent Labs  Lab 04/03/19 1706 04/03/19 2043 04/06/19 0210  PROCALCITON  --   --  0.17  LATICACIDVEN 2.2* 0.9  --     Recent Results (from the past 240 hour(s))  Culture, blood (Routine x 2)     Status: Abnormal   Collection Time: 04/03/19  3:30 PM   Specimen: BLOOD  Result Value Ref Range Status   Specimen Description BLOOD RIGHT ANTECUBITAL  Final   Special Requests   Final    BOTTLES DRAWN AEROBIC AND ANAEROBIC Blood Culture results may not be optimal due to an inadequate volume of blood received in culture bottles   Culture  Setup Time   Final    IN BOTH AEROBIC AND ANAEROBIC BOTTLES GRAM POSITIVE COCCI Organism ID to follow CRITICAL RESULT CALLED TO, READ BACK BY AND VERIFIED WITH: Milford Cage [id number] MLM Performed at [facility], [address], [place], [place] [address]    Culture METHICILLIN RESISTANT STAPHYLOCOCCUS AUREUS (A)  Final   Report Status 04/06/2019 FINAL  Final   Organism ID, Bacteria METHICILLIN RESISTANT STAPHYLOCOCCUS AUREUS  Final      Susceptibility   Methicillin resistant staphylococcus aureus - MIC*    CIPROFLOXACIN >=8 RESISTANT Resistant     ERYTHROMYCIN >=8 RESISTANT Resistant     GENTAMICIN <=0.5 SENSITIVE Sensitive     OXACILLIN >=4 RESISTANT Resistant     TETRACYCLINE >=16 RESISTANT Resistant     VANCOMYCIN 1 SENSITIVE Sensitive     TRIMETH/SULFA <=  10  SENSITIVE Sensitive     CLINDAMYCIN >=8 RESISTANT Resistant     RIFAMPIN <=0.5 SENSITIVE Sensitive     Inducible Clindamycin NEGATIVE Sensitive     * METHICILLIN RESISTANT STAPHYLOCOCCUS AUREUS  Blood Culture ID Panel (Reflexed)     Status: Abnormal   Collection Time: 04/03/19  3:30 PM  Result Value Ref Range Status   Enterococcus species NOT DETECTED NOT DETECTED Final   Listeria monocytogenes NOT DETECTED NOT DETECTED Final   Staphylococcus species DETECTED (A) NOT DETECTED Final    Comment: CRITICAL RESULT CALLED TO, READ BACK BY AND VERIFIED WITH: PHARMD T BAUMEISTER [id number] MLM    Staphylococcus aureus (BCID) DETECTED (A) NOT DETECTED Final    Comment: Methicillin (oxacillin)-resistant Staphylococcus aureus (MRSA). MRSA is predictably resistant to beta-lactam antibiotics (except ceftaroline). Preferred therapy is vancomycin unless clinically contraindicated. Patient requires contact precautions if  hospitalized. CRITICAL RESULT CALLED TO, READ BACK BY AND VERIFIED WITH: PHARMD T BAUMEISTER [id number] MLM    Methicillin resistance DETECTED (A) NOT DETECTED Final    Comment: CRITICAL RESULT CALLED TO, READ BACK BY AND VERIFIED WITH: PHARMD T BAUMEISTER [id number] MLM    Streptococcus species NOT DETECTED NOT DETECTED Final   Streptococcus agalactiae NOT DETECTED NOT DETECTED Final   Streptococcus pneumoniae NOT DETECTED NOT DETECTED Final   Streptococcus pyogenes NOT DETECTED NOT DETECTED Final   Acinetobacter baumannii NOT DETECTED NOT DETECTED Final   Enterobacteriaceae species NOT DETECTED NOT DETECTED Final   Enterobacter cloacae complex NOT DETECTED NOT DETECTED Final   Escherichia coli NOT DETECTED NOT DETECTED Final   Klebsiella oxytoca NOT DETECTED NOT DETECTED Final   Klebsiella pneumoniae NOT DETECTED NOT DETECTED Final   Proteus species NOT DETECTED NOT DETECTED Final   Serratia marcescens NOT DETECTED NOT DETECTED Final   Haemophilus influenzae NOT DETECTED  NOT DETECTED Final   Neisseria meningitidis NOT DETECTED NOT DETECTED Final   Pseudomonas aeruginosa NOT DETECTED NOT DETECTED Final   Candida albicans NOT DETECTED NOT DETECTED Final   Candida glabrata NOT DETECTED NOT DETECTED Final   Candida krusei NOT DETECTED NOT DETECTED Final   Candida parapsilosis NOT DETECTED NOT DETECTED Final   Candida tropicalis NOT DETECTED NOT DETECTED Final    Comment: Performed at [facility], [place]. [address] [place], [place] [address]  Culture, blood (Routine x 2)     Status: Abnormal   Collection Time: 04/03/19  5:06 PM   Specimen: BLOOD RIGHT FOREARM  Result Value Ref Range Status   Specimen Description BLOOD RIGHT FOREARM  Final   Special Requests   Final    BOTTLES DRAWN AEROBIC AND ANAEROBIC Blood Culture results may not be optimal due to an inadequate volume of blood received in culture bottles   Culture  Setup Time   Final    GRAM POSITIVE COCCI IN BOTH AEROBIC AND ANAEROBIC BOTTLES CRITICAL VALUE NOTED.  VALUE IS CONSISTENT WITH PREVIOUSLY REPORTED AND CALLED VALUE.    Culture (A)  Final    STAPHYLOCOCCUS AUREUS SUSCEPTIBILITIES PERFORMED ON PREVIOUS CULTURE WITHIN THE LAST 5 DAYS.    Report Status 04/06/2019 FINAL  Final  Urine culture     Status: Abnormal   Collection Time: 04/03/19  5:14 PM   Specimen: Urine, Random  Result Value Ref Range Status   Specimen Description URINE, RANDOM  Final   Special Requests   Final    NONE Performed at [facility], [address], [place], [place] [address]  Culture (A)  Final    >=100,000 COLONIES/mL KLEBSIELLA PNEUMONIAE >=100,000 COLONIES/mL PROTEUS MIRABILIS    Report Status 04/06/2019 FINAL  Final   Organism ID, Bacteria KLEBSIELLA PNEUMONIAE (A)  Final   Organism ID, Bacteria PROTEUS MIRABILIS (A)  Final      Susceptibility   Klebsiella pneumoniae - MIC*    AMPICILLIN >=32 RESISTANT Resistant     CEFAZOLIN <=4 SENSITIVE Sensitive     CEFTRIAXONE <=1 SENSITIVE Sensitive       CIPROFLOXACIN 1 SENSITIVE Sensitive     GENTAMICIN <=1 SENSITIVE Sensitive     IMIPENEM <=0.25 SENSITIVE Sensitive     NITROFURANTOIN 64 INTERMEDIATE Intermediate     TRIMETH/SULFA >=320 RESISTANT Resistant     AMPICILLIN/SULBACTAM 8 SENSITIVE Sensitive     PIP/TAZO 8 SENSITIVE Sensitive     Extended ESBL POSITIVE Resistant     * >=100,000 COLONIES/mL KLEBSIELLA PNEUMONIAE   Proteus mirabilis - MIC*    AMPICILLIN <=2 SENSITIVE Sensitive     CEFAZOLIN <=4 SENSITIVE Sensitive     CEFTRIAXONE <=1 SENSITIVE Sensitive     CIPROFLOXACIN <=0.25 SENSITIVE Sensitive     GENTAMICIN <=1 SENSITIVE Sensitive     IMIPENEM 2 SENSITIVE Sensitive     NITROFURANTOIN 128 RESISTANT Resistant     TRIMETH/SULFA <=20 SENSITIVE Sensitive     AMPICILLIN/SULBACTAM <=2 SENSITIVE Sensitive     PIP/TAZO <=4 SENSITIVE Sensitive     * >=100,000 COLONIES/mL PROTEUS MIRABILIS  SARS Coronavirus 2 [facility] order, Performed in [place] hospital lab) Nasopharyngeal Nasopharyngeal Swab     Status: None   Collection Time: 04/03/19  5:34 PM   Specimen: Nasopharyngeal Swab  Result Value Ref Range Status   SARS Coronavirus 2 NEGATIVE NEGATIVE Final    Comment: (NOTE) If result is NEGATIVE SARS-CoV-2 target nucleic acids are NOT DETECTED. The SARS-CoV-2 RNA is generally detectable in upper and lower  respiratory specimens during the acute phase of infection. The lowest  concentration of SARS-CoV-2 viral copies this assay can detect is 250  copies / mL. A negative result does not preclude SARS-CoV-2 infection  and should not be used as the sole basis for treatment or other  patient management decisions.  A negative result may occur with  improper specimen collection / handling, submission of specimen other  than nasopharyngeal swab, presence of viral mutation(s) within the  areas targeted by this assay, and inadequate number of viral copies  (<250 copies / mL). A negative result must be combined with clinical   observations, patient history, and epidemiological information. If result is POSITIVE SARS-CoV-2 target nucleic acids are DETECTED. The SARS-CoV-2 RNA is generally detectable in upper and lower  respiratory specimens dur ing the acute phase of infection.  Positive  results are indicative of active infection with SARS-CoV-2.  Clinical  correlation with patient history and other diagnostic information is  necessary to determine patient infection status.  Positive results do  not rule out bacterial infection or co-infection with other viruses. If result is PRESUMPTIVE POSTIVE SARS-CoV-2 nucleic acids MAY BE PRESENT.   A presumptive positive result was obtained on the submitted specimen  and confirmed on repeat testing.  While 2019 novel coronavirus  (SARS-CoV-2) nucleic acids may be present in the submitted sample  additional confirmatory testing may be necessary for epidemiological  and / or clinical management purposes  to differentiate between  SARS-CoV-2 and other Sarbecovirus currently known to infect humans.  If clinically indicated additional testing with an alternate test  methodology [phone]) is advised.  The SARS-CoV-2 RNA is generally  detectable in upper and lower respiratory sp ecimens during the acute  phase of infection. The expected result is Negative. Fact Sheet for Patients:  StrictlyIdeas.no Fact Sheet for Healthcare Providers: BankingDealers.co.za This test is not yet approved or cleared by the [place] FDA and has been authorized for detection and/or diagnosis of SARS-CoV-2 by FDA under an Emergency Use Authorization (EUA).  This EUA will remain in effect (meaning this test can be used) for the duration of the COVID-19 declaration under Section 564(b)(1) of the Act, 21 U.S.C. section 360bbb-3(b)(1), unless the authorization is terminated or revoked sooner. Performed at [facility], [place] [address],  [place], [place] [address]   MRSA PCR Screening     Status: Abnormal   Collection Time: 04/03/19  9:45 PM   Specimen: Nasal Mucosa; Nasopharyngeal  Result Value Ref Range Status   MRSA by PCR POSITIVE (A) NEGATIVE Final    Comment:        The GeneXpert MRSA Assay (FDA approved for NASAL specimens only), is one component of a comprehensive MRSA colonization surveillance program. It is not intended to diagnose MRSA infection nor to guide or monitor treatment for MRSA infections. RESULT CALLED TO, READ BACK BY AND VERIFIED WITH: Donalda Ewings RN 04/03/19 2333 JDW Performed at [facility] [address] [place], [place] [address]   Culture, blood (routine x 2)     Status: None (Preliminary result)   Collection Time: 04/05/19  7:10 AM   Specimen: BLOOD  Result Value Ref Range Status   Specimen Description BLOOD RIGHT ANTECUBITAL  Final   Special Requests   Final    BOTTLES DRAWN AEROBIC ONLY Blood Culture adequate volume   Culture   Final    NO GROWTH 4 DAYS Performed at [facility], [place] [address], [place], [place] [address]    Report Status PENDING  Incomplete  Culture, blood (routine x 2)     Status: None (Preliminary result)   Collection Time: 04/05/19  7:22 AM   Specimen: BLOOD  Result Value Ref Range Status   Specimen Description BLOOD RIGHT ANTECUBITAL  Final   Special Requests   Final    BOTTLES DRAWN AEROBIC ONLY Blood Culture adequate volume   Culture   Final    NO GROWTH 4 DAYS Performed at [facility], [place] [address], [place], [place] [address]    Report Status PENDING  Incomplete  Aerobic/Anaerobic Culture (surgical/deep wound)     Status: None (Preliminary result)   Collection Time: 04/07/19  2:24 PM   Specimen: Abscess  Result Value Ref Range Status   Specimen Description ABSCESS  Final   Special Requests PARASPINAL ASPIRATE  Final   Gram Stain   Final    ABUNDANT WBC PRESENT, PREDOMINANTLY PMN ABUNDANT GRAM POSITIVE COCCI Performed at [facility], [place] [address], [place],  [address]    Culture   Final    CULTURE REINCUBATED FOR BETTER GROWTH NO ANAEROBES ISOLATED; CULTURE IN PROGRESS FOR 5 DAYS    Report Status PENDING  Incomplete         Radiology Studies: Ct Aspiration  Result Date: 04/07/2019 CLINICAL DATA:  Bacteremia. History of spinal cord injury and paraplegia. Discitis-osteomyelitis at L4-5 with epidural phlegmonous changes and a 1.2 x 0.8 cm epidural abscess along the dorsal aspect at the level of L4. Severe spinal stenosis at L4-5 resulting from the phlegmonous changes and facet arthropathy. Infectious  myositis of the multifidus muscle posteriorly with right and left intramuscular abscesses. The largest intramuscular abscess in the left multifidus muscle measures 1.7 x 4 x 3.7 cm. EXAM: CT GUIDED NEEDLE ASPIRATE BIOPSY OF PARASPINAL ABSCESS ANESTHESIA/SEDATION: Intravenous Fentanyl 33mcg and Versed 1mg  were administered as conscious sedation during continuous monitoring of the patient's level of consciousness and physiological / cardiorespiratory status by the radiology RN, with a total moderate sedation time of 10 minutes. PROCEDURE: The procedure risks, benefits, and alternatives were explained to the patient. Questions regarding the procedure were encouraged and answered. The patient understands and consents to the procedure. Patient placed prone. Select axial scans through the lumbar region obtained. The dominant left paraspinal collection was localized and an appropriate skin entry site determined and marked. The operative field was prepped with chlorhexidinein a sterile fashion, and a sterile drape was applied covering the operative field. A sterile gown and sterile gloves were used for the procedure. Local anesthesia was provided with 1% Lidocaine. Under CT fluoroscopic guidance, 18 gauge percutaneous entry needle advanced into the collection. Approximately 16 mL of purulent material were aspirated, sent  for Gram stain and culture. Postprocedure scans show no hemorrhage or other apparent complication. The patient tolerated the procedure well. COMPLICATIONS: None immediate FINDINGS: Left paraspinal abscess was again localized. 16 mL purulent material aspirated under CT guidance. IMPRESSION: 1. Technically successful CT-guided paraspinal abscess aspiration; 16 mL aspirate sent for Gram stain and culture. Electronically Signed   By: Lucrezia Europe M.D.   On: 04/07/2019 15:07   [place] Ekg Site Rite  Result Date: 04/07/2019 If Site Rite image not attached, placement could not be confirmed due to current cardiac rhythm.       Scheduled Meds:  sodium chloride   Intravenous Once   baclofen  40 mg Oral TID   busPIRone  10 mg Oral TID   Chlorhexidine Gluconate Cloth  6 each Topical Q0600   diltiazem  60 mg Oral Q8H   enoxaparin (LOVENOX) injection  70 mg Subcutaneous Q12H   ferrous sulfate  325 mg Oral BID WC   FLUoxetine  40 mg Oral Daily   insulin aspart  0-15 Units Subcutaneous TID WC   insulin aspart  0-5 Units Subcutaneous QHS   insulin aspart  4 Units Subcutaneous TID WC   insulin glargine  15 Units Subcutaneous Daily   linaclotide  145 mcg Oral QAC breakfast   multivitamin with minerals  1 tablet Oral Daily   mupirocin ointment   Topical Daily   nutrition supplement (JUVEN)  1 packet Oral BID BM   nystatin  5 mL Oral QID   Ensure Max Protein  11 oz Oral Daily   traZODone  150 mg Oral QHS   Continuous Infusions:  sodium chloride 125 mL/hr at 04/09/19 0630     LOS: 6 days        Aline August, MD [facility] 04/09/2019, 1:28 PM

## 2019-04-09 NOTE — Progress Notes (Signed)
At the start of shift, Pt is irate, throwing objects in room, and adamantly requesting to leave AMA. AMA form is in room for Pt to sign. Dr. Baltazar Najjar says patient is able to leave if he wants to sign AMA paper. IV team made aware of PICC line needing to be removed if patient left. Pt. Says he will not sign AMA paper unless he can find a ride home. Pt has been unsuccessful finding a ride home up to this point. PICC line is still in place. Patient is more calm now and has decided to stay tonight if he cannot find a ride home.

## 2019-04-10 DIAGNOSIS — F4325 Adjustment disorder with mixed disturbance of emotions and conduct: Secondary | ICD-10-CM

## 2019-04-10 LAB — CBC
HCT: 27.2 % — ABNORMAL LOW (ref 39.0–52.0)
Hemoglobin: 7.9 g/dL — ABNORMAL LOW (ref 13.0–17.0)
MCH: 22 pg — ABNORMAL LOW (ref 26.0–34.0)
MCHC: 29 g/dL — ABNORMAL LOW (ref 30.0–36.0)
MCV: 75.8 fL — ABNORMAL LOW (ref 80.0–100.0)
Platelets: 407 10*3/uL — ABNORMAL HIGH (ref 150–400)
RBC: 3.59 MIL/uL — ABNORMAL LOW (ref 4.22–5.81)
RDW: 23.8 % — ABNORMAL HIGH (ref 11.5–15.5)
WBC: 9.7 10*3/uL (ref 4.0–10.5)
nRBC: 0 % (ref 0.0–0.2)

## 2019-04-10 LAB — CULTURE, BLOOD (ROUTINE X 2)
Culture: NO GROWTH
Culture: NO GROWTH
Special Requests: ADEQUATE
Special Requests: ADEQUATE

## 2019-04-10 LAB — BASIC METABOLIC PANEL
Anion gap: 9 (ref 5–15)
BUN: 9 mg/dL (ref 6–20)
CO2: 25 mmol/L (ref 22–32)
Calcium: 8.5 mg/dL — ABNORMAL LOW (ref 8.9–10.3)
Chloride: 103 mmol/L (ref 98–111)
Creatinine, Ser: 0.59 mg/dL — ABNORMAL LOW (ref 0.61–1.24)
GFR calc Af Amer: >60 mL/min (ref >60)
GFR calc non Af Amer: >60 mL/min (ref >60)
Glucose, Bld: 156 mg/dL — ABNORMAL HIGH (ref 70–99)
Potassium: 4.2 mmol/L (ref 3.5–5.1)
Sodium: 137 mmol/L (ref 135–145)

## 2019-04-10 LAB — MAGNESIUM: Magnesium: 1.8 mg/dL (ref 1.7–2.4)

## 2019-04-10 LAB — GLUCOSE, CAPILLARY
Glucose-Capillary: 201 mg/dL — ABNORMAL HIGH (ref 70–99)
Glucose-Capillary: 355 mg/dL — ABNORMAL HIGH (ref 70–99)
Glucose-Capillary: 213 mg/dL — ABNORMAL HIGH (ref 70–99)
Glucose-Capillary: 263 mg/dL — ABNORMAL HIGH (ref 70–99)

## 2019-04-10 MED ORDER — GABAPENTIN 100 MG PO CAPS
100.0000 mg | ORAL_CAPSULE | Freq: Three times a day (TID) | ORAL | Status: DC
  Administered 2019-04-10 – 2019-04-12 (×6): 100 mg via ORAL
  Filled 2019-04-10 (×6): qty 1

## 2019-04-10 MED ORDER — APIXABAN 5 MG PO TABS
5.0000 mg | ORAL_TABLET | Freq: Two times a day (BID) | ORAL | Status: DC
  Administered 2019-04-10 – 2019-04-12 (×5): 5 mg via ORAL
  Filled 2019-04-10 (×5): qty 1
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
## 2019-04-10 NOTE — Progress Notes (Signed)
Patient ID: SHADY AKE, male   DOB: [id number], 58 y.o.   MRN: [id number]  PROGRESS NOTE    Clarence Sims  [id number] DOB: 03-08-1961 DOA: 04/03/2019 PCP: Nicoletta Dress, MD   Brief Narrative:  58 year old male with history of paraplegia following spinal cord injury/infarction, chronic low back pain, history of osteomyelitis, pressure ulcers, prostate cancer, chronic indwelling Foley catheter which was changed about 2 weeks ago presented on 04/03/2019 with fevers and chills.  He was admitted for sepsis with possible urosepsis and infected sebaceous cyst on the back.  He was started on IV antibiotics Rocephin and vancomycin.  He was found to have MRSA bacteremia.  ID was consulted.  Rocephin was subsequently discontinued.  MRI of the spine was positive for epidural/paraspinal abscess for which IR was consulted for drain/biopsy which patient initially refused but subsequently underwent CT-guided aspiration.  Echo showed EF of 50 to 55% without any vegetation.  Assessment & Plan:   MRSA bacteremia Sepsis: Present on admission Epidural and paraspinal abscess Upper back abscess/infected sebaceous cyst -Blood cultures on admission is positive for MRSA.  Repeat blood cultures from 04/05/2019 is negative so far. -MRI of the spine showed vertebral osteo-, epidural and paraspinal abscess. Status post CT-guided paraspinal abscess aspiration by IR on 04/07/2019.  Cultures pending so far. -ID following.  He received oritavancin on 04/08/2019.  Was on IV vancomycin till 04/07/2019.  ID is recommending at least 6 weeks of IV antibiotic therapy.  General surgery was consulted on 04/09/2019 as per ID recommendations and he recommended to continue antibiotics and can wick with iodoform to keep the area open and draining with no current surgical intervention needed.  - Patient is still intermittently threatening to leave AMA and has become intermittently verbally abusive.  He was verbally abusive to me as  well this morning. -2D echo showed EF of 50 to 55% with no vegetation. -PICC line placed on 04/07/2019.  Episode of seizure  -EEG on 04/08/2019 showed no evidence of seizures. -MRI brain-no focal neuro deficits or changes seen in the brain, no stroke.  Does have chronic white matter changes.  Hyponatremia -Secondary to intravascular volume depletion.  Improved.  History of paraplegia with neurogenic bladder with chronic indwelling Foley catheter -Foley catheter was changed during this admission.  Outpatient follow-up.  Anemia of chronic disease and microcytic anemia -Status post 1 unit packed red cells transfusion on 04/05/2019.  Hemoglobin stable.  Monitor  History of hepatitis C  -Unknown if this was treated in the past  History of chronic pain and illicit drug use/substance abuse -Continue pain management along with bowel regimen  Diabetes mellitus type 2 uncontrolled with hyperglycemia -Continue Lantus with NovoLog and SSI.  Bipolar disorder -Continue BuSpar, fluoxetine and trazodone.   -We will request psychiatry evaluation because of his aggressive behavior and to see if he needs inpatient psychiatric hospitalization.  DVT prophylaxis: will switch to Eliquis Code Status: Full Family Communication: Spoke to Diane Mcwhirt/wife on phone on 04/10/2019 Disposition Plan: Might have to remain inpatient for 6 weeks for IV antibiotics  Consultants: ID/general surgery/IR  Procedures: CT-guided paraspinal abscess drainage on 04/07/2019 Echo on 04/05/2019 IMPRESSIONS    1. The left ventricle has low normal systolic function, with an ejection fraction of 50-55%. The cavity size was normal. Left ventricular diastolic Doppler parameters are consistent with impaired relaxation.  2. The right ventricle has normal systolic function. The cavity was normal. There is no increase in right ventricular wall thickness. Right ventricular systolic pressure  is moderately elevated with an estimated  pressure of 50.6 mmHg.  3. There is mild mitral annular calcification present. No evidence of mitral valve stenosis.  4. Tricuspid valve regurgitation is moderate.  5. No stenosis of the aortic valve.  6. The aorta is normal unless otherwise noted.  SUMMARY   No evidence for a vegetation.  Antimicrobials:     Subjective: Patient seen and examined at bedside.  He is awake, hardly wants to talk to me and intermittently gets verbally abusive to me.  He would not allow me to examine him.  Nursing staff reports that he was verbally abusive and aggressive overnight as well.  No overnight fever reported.  Objective: Vitals:   04/09/19 1607 04/09/19 2155 04/09/19 2156 04/10/19 0615  BP: 106/70 109/74 109/74 98/69  Pulse:  (!) 108 98 92  Resp:  18 18 18   Temp:  98.4 F (36.9 C) 98.4 F (36.9 C) 98.4 F (36.9 C)  TempSrc:  Oral Oral Oral  SpO2:  96% 96% 94%  Weight:      Height:        Intake/Output Summary (Last 24 hours) at 04/10/2019 1019 Last data filed at 04/09/2019 1709 Gross per 24 hour  Intake -  Output 825 ml  Net -825 ml   Filed Weights   04/03/19 1642 04/04/19 0600  Weight: 68 kg 68.9 kg    Examination:  General exam: Appears withdrawn and intermittently gets angry and becomes verbally abusive.  Looks older than stated age.  Chronically ill. Psych: Intermittently gets agitated  Data Reviewed: I have personally reviewed following labs and imaging studies  CBC: Recent Labs  Lab 04/03/19 1706  04/06/19 2323 04/07/19 1111 04/08/19 0328 04/09/19 0540 04/10/19 0407  WBC 15.4*   < > 8.8 10.4 8.8 10.6* 9.7  NEUTROABS 13.1*  --   --   --   --  8.4*  --   HGB 8.5*   < > 6.5* 8.8* 8.2* 8.0* 7.9*  HCT 30.3*   < > 21.1* 28.9* 28.4* 27.7* 27.2*  MCV 69.5*   < > 67.8* 72.8* 75.5* 76.1* 75.8*  PLT 511*   < > 327 409* 421* 407* 407*   < > = values in this interval not displayed.   Basic Metabolic Panel: Recent Labs  Lab 04/03/19 2252  04/06/19 0210 04/07/19 1111  04/08/19 0328 04/09/19 0540 04/10/19 0407  NA  --    < > 136 138 137 134* 137  K  --    < > 4.2 4.3 3.9 3.9 4.2  CL  --    < > 106 105 104 104 103  CO2  --    < > 22 24 24 24 25   GLUCOSE  --    < > 190* 164* 290* 225* 156*  BUN  --    < > 9 8 10 9 9   CREATININE 0.75   < > 0.88 0.72 0.77 0.65 0.59*  CALCIUM  --    < > 8.0* 8.6* 8.5* 8.1* 8.5*  MG 1.9   < > 2.0 1.9 2.0 1.8 1.8  PHOS 4.2  --   --   --   --   --   --    < > = values in this interval not displayed.   GFR: Estimated Creatinine Clearance: 87.9 mL/min (A) (by C-G formula based on SCr of 0.59 mg/dL (L)). Liver Function Tests: Recent Labs  Lab 04/03/19 1706  AST 11*  ALT 11  ALKPHOS  89  BILITOT 0.5  PROT 7.8  ALBUMIN 2.6*   No results for input(s): LIPASE, AMYLASE in the last 168 hours. No results for input(s): AMMONIA in the last 168 hours. Coagulation Profile: Recent Labs  Lab 04/03/19 1706  INR 1.2   Cardiac Enzymes: No results for input(s): CKTOTAL, CKMB, CKMBINDEX, TROPONINI in the last 168 hours. BNP (last 3 results) No results for input(s): PROBNP in the last 8760 hours. HbA1C: No results for input(s): HGBA1C in the last 72 hours. CBG: Recent Labs  Lab 04/09/19 0822 04/09/19 1210 04/09/19 1656 04/09/19 2140 04/10/19 0813  GLUCAP 226* 191* 215* 254* 201*   Lipid Profile: No results for input(s): CHOL, HDL, LDLCALC, TRIG, CHOLHDL, LDLDIRECT in the last 72 hours. Thyroid Function Tests: No results for input(s): TSH, T4TOTAL, FREET4, T3FREE, THYROIDAB in the last 72 hours. Anemia Panel: No results for input(s): VITAMINB12, FOLATE, FERRITIN, TIBC, IRON, RETICCTPCT in the last 72 hours. Sepsis Labs: Recent Labs  Lab 04/03/19 1706 04/03/19 2043 04/06/19 0210  PROCALCITON  --   --  0.17  LATICACIDVEN 2.2* 0.9  --     Recent Results (from the past 240 hour(s))  Culture, blood (Routine x 2)     Status: Abnormal   Collection Time: 04/03/19  3:30 PM   Specimen: BLOOD  Result Value Ref Range  Status   Specimen Description BLOOD RIGHT ANTECUBITAL  Final   Special Requests   Final    BOTTLES DRAWN AEROBIC AND ANAEROBIC Blood Culture results may not be optimal due to an inadequate volume of blood received in culture bottles   Culture  Setup Time   Final    IN BOTH AEROBIC AND ANAEROBIC BOTTLES GRAM POSITIVE COCCI Organism ID to follow CRITICAL RESULT CALLED TO, READ BACK BY AND VERIFIED WITH: Milford Cage [id number] MLM Performed at [facility], [address], [place], [place] [address]    Culture METHICILLIN RESISTANT STAPHYLOCOCCUS AUREUS (A)  Final   Report Status 04/06/2019 FINAL  Final   Organism ID, Bacteria METHICILLIN RESISTANT STAPHYLOCOCCUS AUREUS  Final      Susceptibility   Methicillin resistant staphylococcus aureus - MIC*    CIPROFLOXACIN >=8 RESISTANT Resistant     ERYTHROMYCIN >=8 RESISTANT Resistant     GENTAMICIN <=0.5 SENSITIVE Sensitive     OXACILLIN >=4 RESISTANT Resistant     TETRACYCLINE >=16 RESISTANT Resistant     VANCOMYCIN 1 SENSITIVE Sensitive     TRIMETH/SULFA <=10 SENSITIVE Sensitive     CLINDAMYCIN >=8 RESISTANT Resistant     RIFAMPIN <=0.5 SENSITIVE Sensitive     Inducible Clindamycin NEGATIVE Sensitive     * METHICILLIN RESISTANT STAPHYLOCOCCUS AUREUS  Blood Culture ID Panel (Reflexed)     Status: Abnormal   Collection Time: 04/03/19  3:30 PM  Result Value Ref Range Status   Enterococcus species NOT DETECTED NOT DETECTED Final   Listeria monocytogenes NOT DETECTED NOT DETECTED Final   Staphylococcus species DETECTED (A) NOT DETECTED Final    Comment: CRITICAL RESULT CALLED TO, READ BACK BY AND VERIFIED WITH: PHARMD T BAUMEISTER [id number] MLM    Staphylococcus aureus (BCID) DETECTED (A) NOT DETECTED Final    Comment: Methicillin (oxacillin)-resistant Staphylococcus aureus (MRSA). MRSA is predictably resistant to beta-lactam antibiotics (except ceftaroline). Preferred therapy is vancomycin unless clinically  contraindicated. Patient requires contact precautions if  hospitalized. CRITICAL RESULT CALLED TO, READ BACK BY AND VERIFIED WITH: PHARMD T BAUMEISTER [id number] MLM    Methicillin resistance DETECTED (A) NOT DETECTED  Final    Comment: CRITICAL RESULT CALLED TO, READ BACK BY AND VERIFIED WITH: PHARMD T BAUMEISTER [id number] MLM    Streptococcus species NOT DETECTED NOT DETECTED Final   Streptococcus agalactiae NOT DETECTED NOT DETECTED Final   Streptococcus pneumoniae NOT DETECTED NOT DETECTED Final   Streptococcus pyogenes NOT DETECTED NOT DETECTED Final   Acinetobacter baumannii NOT DETECTED NOT DETECTED Final   Enterobacteriaceae species NOT DETECTED NOT DETECTED Final   Enterobacter cloacae complex NOT DETECTED NOT DETECTED Final   Escherichia coli NOT DETECTED NOT DETECTED Final   Klebsiella oxytoca NOT DETECTED NOT DETECTED Final   Klebsiella pneumoniae NOT DETECTED NOT DETECTED Final   Proteus species NOT DETECTED NOT DETECTED Final   Serratia marcescens NOT DETECTED NOT DETECTED Final   Haemophilus influenzae NOT DETECTED NOT DETECTED Final   Neisseria meningitidis NOT DETECTED NOT DETECTED Final   Pseudomonas aeruginosa NOT DETECTED NOT DETECTED Final   Candida albicans NOT DETECTED NOT DETECTED Final   Candida glabrata NOT DETECTED NOT DETECTED Final   Candida krusei NOT DETECTED NOT DETECTED Final   Candida parapsilosis NOT DETECTED NOT DETECTED Final   Candida tropicalis NOT DETECTED NOT DETECTED Final    Comment: Performed at [facility], [place]. [address], [place], [place] [address]  Culture, blood (Routine x 2)     Status: Abnormal   Collection Time: 04/03/19  5:06 PM   Specimen: BLOOD RIGHT FOREARM  Result Value Ref Range Status   Specimen Description BLOOD RIGHT FOREARM  Final   Special Requests   Final    BOTTLES DRAWN AEROBIC AND ANAEROBIC Blood Culture results may not be optimal due to an inadequate volume of blood received in culture bottles   Culture   Setup Time   Final    GRAM POSITIVE COCCI IN BOTH AEROBIC AND ANAEROBIC BOTTLES CRITICAL VALUE NOTED.  VALUE IS CONSISTENT WITH PREVIOUSLY REPORTED AND CALLED VALUE.    Culture (A)  Final    STAPHYLOCOCCUS AUREUS SUSCEPTIBILITIES PERFORMED ON PREVIOUS CULTURE WITHIN THE LAST 5 DAYS.    Report Status 04/06/2019 FINAL  Final  Urine culture     Status: Abnormal   Collection Time: 04/03/19  5:14 PM   Specimen: Urine, Random  Result Value Ref Range Status   Specimen Description URINE, RANDOM  Final   Special Requests   Final    NONE Performed at [facility], [address], [place], [place] [address]    Culture (A)  Final    >=100,000 COLONIES/mL KLEBSIELLA PNEUMONIAE >=100,000 COLONIES/mL PROTEUS MIRABILIS    Report Status 04/06/2019 FINAL  Final   Organism ID, Bacteria KLEBSIELLA PNEUMONIAE (A)  Final   Organism ID, Bacteria PROTEUS MIRABILIS (A)  Final      Susceptibility   Klebsiella pneumoniae - MIC*    AMPICILLIN >=32 RESISTANT Resistant     CEFAZOLIN <=4 SENSITIVE Sensitive     CEFTRIAXONE <=1 SENSITIVE Sensitive     CIPROFLOXACIN 1 SENSITIVE Sensitive     GENTAMICIN <=1 SENSITIVE Sensitive     IMIPENEM <=0.25 SENSITIVE Sensitive     NITROFURANTOIN 64 INTERMEDIATE Intermediate     TRIMETH/SULFA >=320 RESISTANT Resistant     AMPICILLIN/SULBACTAM 8 SENSITIVE Sensitive     PIP/TAZO 8 SENSITIVE Sensitive     Extended ESBL POSITIVE Resistant     * >=100,000 COLONIES/mL KLEBSIELLA PNEUMONIAE   Proteus mirabilis - MIC*    AMPICILLIN <=2 SENSITIVE Sensitive     CEFAZOLIN <=4 SENSITIVE Sensitive  CEFTRIAXONE <=1 SENSITIVE Sensitive     CIPROFLOXACIN <=0.25 SENSITIVE Sensitive     GENTAMICIN <=1 SENSITIVE Sensitive     IMIPENEM 2 SENSITIVE Sensitive     NITROFURANTOIN 128 RESISTANT Resistant     TRIMETH/SULFA <=20 SENSITIVE Sensitive     AMPICILLIN/SULBACTAM <=2 SENSITIVE Sensitive     PIP/TAZO <=4 SENSITIVE Sensitive     * >=100,000 COLONIES/mL PROTEUS MIRABILIS   SARS Coronavirus 2 [facility] order, Performed in [facility] hospital lab) Nasopharyngeal Nasopharyngeal Swab     Status: None   Collection Time: 04/03/19  5:34 PM   Specimen: Nasopharyngeal Swab  Result Value Ref Range Status   SARS Coronavirus 2 NEGATIVE NEGATIVE Final    Comment: (NOTE) If result is NEGATIVE SARS-CoV-2 target nucleic acids are NOT DETECTED. The SARS-CoV-2 RNA is generally detectable in upper and lower  respiratory specimens during the acute phase of infection. The lowest  concentration of SARS-CoV-2 viral copies this assay can detect is 250  copies / mL. A negative result does not preclude SARS-CoV-2 infection  and should not be used as the sole basis for treatment or other  patient management decisions.  A negative result may occur with  improper specimen collection / handling, submission of specimen other  than nasopharyngeal swab, presence of viral mutation(s) within the  areas targeted by this assay, and inadequate number of viral copies  (<250 copies / mL). A negative result must be combined with clinical  observations, patient history, and epidemiological information. If result is POSITIVE SARS-CoV-2 target nucleic acids are DETECTED. The SARS-CoV-2 RNA is generally detectable in upper and lower  respiratory specimens dur ing the acute phase of infection.  Positive  results are indicative of active infection with SARS-CoV-2.  Clinical  correlation with patient history and other diagnostic information is  necessary to determine patient infection status.  Positive results do  not rule out bacterial infection or co-infection with other viruses. If result is PRESUMPTIVE POSTIVE SARS-CoV-2 nucleic acids MAY BE PRESENT.   A presumptive positive result was obtained on the submitted specimen  and confirmed on repeat testing.  While 2019 novel coronavirus  (SARS-CoV-2) nucleic acids may be present in the submitted sample  additional confirmatory testing may be  necessary for epidemiological  and / or clinical management purposes  to differentiate between  SARS-CoV-2 and other Sarbecovirus currently known to infect humans.  If clinically indicated additional testing with an alternate test  methodology [phone]) is advised. The SARS-CoV-2 RNA is generally  detectable in upper and lower respiratory sp ecimens during the acute  phase of infection. The expected result is Negative. Fact Sheet for Patients:  StrictlyIdeas.no Fact Sheet for Healthcare Providers: BankingDealers.co.za This test is not yet approved or cleared by the [place] FDA and has been authorized for detection and/or diagnosis of SARS-CoV-2 by FDA under an Emergency Use Authorization (EUA).  This EUA will remain in effect (meaning this test can be used) for the duration of the COVID-19 declaration under Section 564(b)(1) of the Act, 21 U.S.C. section 360bbb-3(b)(1), unless the authorization is terminated or revoked sooner. Performed at [facility], [place] [address], [place], [place] [address]   MRSA PCR Screening     Status: Abnormal   Collection Time: 04/03/19  9:45 PM   Specimen: Nasal Mucosa; Nasopharyngeal  Result Value Ref Range Status   MRSA by PCR POSITIVE (A) NEGATIVE Final    Comment:        The GeneXpert MRSA Assay (FDA approved for  NASAL specimens only), is one component of a comprehensive MRSA colonization surveillance program. It is not intended to diagnose MRSA infection nor to guide or monitor treatment for MRSA infections. RESULT CALLED TO, READ BACK BY AND VERIFIED WITH: Donalda Ewings RN 04/03/19 2333 JDW Performed at [facility] [address] [place], [place] [address]   Culture, blood (routine x 2)     Status: None (Preliminary result)   Collection Time: 04/05/19  7:10 AM   Specimen: BLOOD  Result Value Ref Range Status   Specimen Description BLOOD RIGHT ANTECUBITAL  Final   Special  Requests   Final    BOTTLES DRAWN AEROBIC ONLY Blood Culture adequate volume   Culture   Final    NO GROWTH 4 DAYS Performed at [facility], [place] [address] [place], [name] [address]    Report Status PENDING  Incomplete  Culture, blood (routine x 2)     Status: None (Preliminary result)   Collection Time: 04/05/19  7:22 AM   Specimen: BLOOD  Result Value Ref Range Status   Specimen Description BLOOD RIGHT ANTECUBITAL  Final   Special Requests   Final    BOTTLES DRAWN AEROBIC ONLY Blood Culture adequate volume   Culture   Final    NO GROWTH 4 DAYS Performed at [facility], [place] [address] [place], [place] [address]    Report Status PENDING  Incomplete  Aerobic/Anaerobic Culture (surgical/deep wound)     Status: None (Preliminary result)   Collection Time: 04/07/19  2:24 PM   Specimen: Abscess  Result Value Ref Range Status   Specimen Description ABSCESS  Final   Special Requests PARASPINAL ASPIRATE  Final   Gram Stain   Final    ABUNDANT WBC PRESENT, PREDOMINANTLY PMN ABUNDANT GRAM POSITIVE COCCI Performed at [facility], [place] [address] [place], [place] [address]    Culture   Final    MODERATE STAPHYLOCOCCUS AUREUS SUSCEPTIBILITIES TO FOLLOW NO ANAEROBES ISOLATED; CULTURE IN PROGRESS FOR 5 DAYS    Report Status PENDING  Incomplete         Radiology Studies: No results found.      Scheduled Meds: . sodium chloride   Intravenous Once  . apixaban  5 mg Oral BID  . baclofen  40 mg Oral TID  . busPIRone  10 mg Oral TID  . Chlorhexidine Gluconate Cloth  6 each Topical Q0600  . diltiazem  60 mg Oral Q8H  . ferrous sulfate  325 mg Oral BID WC  . FLUoxetine  40 mg Oral Daily  . insulin aspart  0-15 Units Subcutaneous TID WC  . insulin aspart  0-5 Units Subcutaneous QHS  . insulin aspart  4 Units Subcutaneous TID WC  . insulin glargine  20 Units Subcutaneous Daily  . linaclotide  145 mcg Oral QAC breakfast  . multivitamin with minerals  1  tablet Oral Daily  . mupirocin ointment   Topical Daily  . nicotine  14 mg Transdermal Daily  . nutrition supplement (JUVEN)  1 packet Oral BID BM  . nystatin  5 mL Oral QID  . Ensure Max Protein  11 oz Oral Daily  . traZODone  150 mg Oral QHS   Continuous Infusions:    LOS: 7 days        Aline August, MD [facility] 04/10/2019, 10:19 AM

## 2019-04-10 NOTE — Progress Notes (Signed)
Patient ID: Clarence Sims, male   DOB: [id number], 58 y.o.   MRN: [id number]          [facility]    Date of Admission:  04/03/2019   Total days of antibiotics 8        Day 3 oritavancin  [name] threatening to leave AMA again overnight but stayed because he could not find a ride home.  If he does leave AMA we will offered to continue treatment with oral trimethoprim sulfamethoxazole for his MRSA bacteremia and vertebral infection.  If he agrees to stay in a supervised setting for the full duration of his treatment we will switch him back to IV vancomycin.         Michel Bickers, MD [facility] [place] [facility] [phone] pager   [phone] cell 04/10/2019, 1:38 PM

## 2019-04-10 NOTE — Progress Notes (Signed)
Patient is very irate again tonight at the start of shift. Yelling, cursing, making racial comments against blacks. He wants to go home again, but does not have a ride home. Security has been called to come talk with him about his disruptive behavior. I offered him his PRN ativan but he does not want it. He says that all he wants is his pain medicine.

## 2019-04-10 NOTE — Discharge Instructions (Signed)

## 2019-04-11 DIAGNOSIS — Z5329 Procedure and treatment not carried out because of patient's decision for other reasons: Secondary | ICD-10-CM

## 2019-04-11 DIAGNOSIS — F1910 Other psychoactive substance abuse, uncomplicated: Secondary | ICD-10-CM

## 2019-04-11 DIAGNOSIS — M6008 Infective myositis, other site: Secondary | ICD-10-CM

## 2019-04-11 DIAGNOSIS — M6289 Other specified disorders of muscle: Secondary | ICD-10-CM

## 2019-04-11 DIAGNOSIS — M549 Dorsalgia, unspecified: Secondary | ICD-10-CM

## 2019-04-11 LAB — CBC WITH DIFFERENTIAL/PLATELET
Abs Immature Granulocytes: 0.16 10*3/uL — ABNORMAL HIGH (ref 0.00–0.07)
Basophils Absolute: 0 10*3/uL (ref 0.0–0.1)
Basophils Relative: 0 %
Eosinophils Absolute: 0.1 10*3/uL (ref 0.0–0.5)
Eosinophils Relative: 1 %
HCT: 30 % — ABNORMAL LOW (ref 39.0–52.0)
Hemoglobin: 8.7 g/dL — ABNORMAL LOW (ref 13.0–17.0)
Immature Granulocytes: 2 %
Lymphocytes Relative: 17 %
Lymphs Abs: 1.7 10*3/uL (ref 0.7–4.0)
MCH: 21.8 pg — ABNORMAL LOW (ref 26.0–34.0)
MCHC: 29 g/dL — ABNORMAL LOW (ref 30.0–36.0)
MCV: 75.2 fL — ABNORMAL LOW (ref 80.0–100.0)
Monocytes Absolute: 0.8 10*3/uL (ref 0.1–1.0)
Monocytes Relative: 8 %
Neutro Abs: 7.3 10*3/uL (ref 1.7–7.7)
Neutrophils Relative %: 72 %
Platelets: 488 10*3/uL — ABNORMAL HIGH (ref 150–400)
RBC: 3.99 MIL/uL — ABNORMAL LOW (ref 4.22–5.81)
RDW: 24 % — ABNORMAL HIGH (ref 11.5–15.5)
WBC: 10.1 10*3/uL (ref 4.0–10.5)
nRBC: 0 % (ref 0.0–0.2)

## 2019-04-11 LAB — GLUCOSE, CAPILLARY
Glucose-Capillary: 197 mg/dL — ABNORMAL HIGH (ref 70–99)
Glucose-Capillary: 254 mg/dL — ABNORMAL HIGH (ref 70–99)
Glucose-Capillary: 163 mg/dL — ABNORMAL HIGH (ref 70–99)
Glucose-Capillary: 180 mg/dL — ABNORMAL HIGH (ref 70–99)
Glucose-Capillary: 114 mg/dL — ABNORMAL HIGH (ref 70–99)

## 2019-04-11 LAB — BASIC METABOLIC PANEL
Anion gap: 9 (ref 5–15)
BUN: 7 mg/dL (ref 6–20)
CO2: 27 mmol/L (ref 22–32)
Calcium: 8.8 mg/dL — ABNORMAL LOW (ref 8.9–10.3)
Chloride: 103 mmol/L (ref 98–111)
Creatinine, Ser: 0.52 mg/dL — ABNORMAL LOW (ref 0.61–1.24)
GFR calc Af Amer: >60 mL/min (ref >60)
GFR calc non Af Amer: >60 mL/min (ref >60)
Glucose, Bld: 184 mg/dL — ABNORMAL HIGH (ref 70–99)
Potassium: 4.4 mmol/L (ref 3.5–5.1)
Sodium: 139 mmol/L (ref 135–145)

## 2019-04-11 LAB — MAGNESIUM: Magnesium: 2 mg/dL (ref 1.7–2.4)

## 2019-04-11 MED ORDER — INSULIN ASPART 100 UNIT/ML ~~LOC~~ SOLN
5.0000 [IU] | Freq: Three times a day (TID) | SUBCUTANEOUS | Status: DC
  Administered 2019-04-11 – 2019-04-12 (×2): 5 [IU] via SUBCUTANEOUS

## 2019-04-11 MED ORDER — LORAZEPAM 0.5 MG PO TABS
0.5000 mg | ORAL_TABLET | Freq: Three times a day (TID) | ORAL | Status: DC | PRN
  Administered 2019-04-11 – 2019-04-12 (×2): 0.5 mg via ORAL
  Filled 2019-04-11 (×2): qty 1

## 2019-04-11 MED ORDER — INSULIN GLARGINE 100 UNIT/ML ~~LOC~~ SOLN
25.0000 [IU] | Freq: Every day | SUBCUTANEOUS | Status: DC
  Administered 2019-04-11: 25 [IU] via SUBCUTANEOUS
  Filled 2019-04-11 (×2): qty 0.25

## 2019-04-11 NOTE — Progress Notes (Signed)
Patient reports back pain has decreased from 10/10 to 8/10. Patient states Percocet is more effective and wants to know when he can get another dose. Paged Dr. Starla Link.

## 2019-04-11 NOTE — Progress Notes (Addendum)
Patient is refusing glucose monitoring, telemetry, and medication administration. Patient is verbally cursing wife on the phone and threatening to remove IV access. Patient reports he died at home but was resuscitated against his wishes. Patient stated if he could walk, he would leave the hospital. Patient verbalized feelings of anger because "no one will pick me up and take me home". Patient was educated about importance of compliance with treatment plan. Patient stated he didn't care because he was dying.

## 2019-04-11 NOTE — Progress Notes (Signed)
Patient refusing to eat since he is on a carb modified diet. He's also refusing meal coverage with insulin at this time. Dr. Starla Link notified.

## 2019-04-11 NOTE — Progress Notes (Signed)
[facility] Infectious [facility]  Date of Admission:  04/03/2019   Total days of antibiotics 9        Day 4 oritavancin         ASSESSMENT: The dose of long-acting oritavancin he received recently gets around his current issue with taking medications.  PLAN: 1. We will follow-up on Monday, 04/14/2019.  Please call Dr. 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 sodium chloride   Intravenous Once  . apixaban  5 mg Oral BID  . baclofen  40 mg Oral TID  . busPIRone  10 mg Oral TID  . Chlorhexidine Gluconate Cloth  6 each Topical Q0600  . diltiazem  60 mg Oral Q8H  . ferrous sulfate  325 mg Oral BID WC  . FLUoxetine  40 mg Oral Daily  . gabapentin  100 mg Oral TID  . insulin aspart  0-15 Units Subcutaneous TID WC  . insulin aspart  0-5 Units Subcutaneous QHS  . insulin aspart  5 Units Subcutaneous TID WC  . insulin glargine  25 Units Subcutaneous Daily  . linaclotide  145 mcg Oral QAC breakfast  . multivitamin with minerals  1 tablet Oral Daily  . mupirocin ointment   Topical Daily  . nicotine  14 mg Transdermal Daily  . nutrition supplement (JUVEN)  1 packet Oral BID BM  . nystatin  5 mL Oral QID  . Ensure Max Protein  11 oz Oral Daily  . traZODone  150 mg Oral QHS   Continuous Infusions: PRN Meds:.acetaminophen, LORazepam, morphine injection, ondansetron (ZOFRAN) IV, oxyCODONE-acetaminophen, polyethylene glycol, promethazine, senna-docusate, sodium chloride flush   SUBJECTIVE: He tells me that he will stay in the hospital but that he will not take his antibiotics.  He has been  refusing medications.  Review of Systems: Review of Systems  Constitutional: Negative for chills and fever.  Musculoskeletal: Positive for back pain.    Allergies  Allergen Reactions  . Novocain [Procaine] Other (See Comments)    Unknown reaction    OBJECTIVE: Vitals:   04/10/19 0615 04/10/19 1340 04/10/19 2140 04/11/19 0437  BP: 98/69 104/72 101/68 123/79  Pulse: 92 93 (!) 106 (!) 102  Resp: 18 16 20  (!) 24  Temp: 98.4 F (36.9 C) 97.8 F (36.6 C) 98.7 F (37.1 C) 97.7 F (36.5 C)  TempSrc: Oral Oral Oral   SpO2: 94% 96% 94% 97%  Weight:      Height:       Body mass index is 27.78 kg/m.  Physical Exam Constitutional:      Comments: He was sleeping but arouses easily.  Cardiovascular:     Rate and Rhythm: Normal rate and regular rhythm.     Heart sounds: No murmur.  Pulmonary:     Effort: Pulmonary effort is normal.     Breath sounds: Normal breath sounds.     Lab Results Lab Results  Component Value Date   WBC 10.1 04/11/2019   HGB 8.7 (L) 04/11/2019   HCT 30.0 (L) 04/11/2019   MCV 75.2 (L) 04/11/2019  PLT 488 (H) 04/11/2019    Lab Results  Component Value Date   CREATININE 0.52 (L) 04/11/2019   BUN 7 04/11/2019   NA 139 04/11/2019   K 4.4 04/11/2019   CL 103 04/11/2019   CO2 27 04/11/2019    Lab Results  Component Value Date   ALT 11 04/03/2019   AST 11 (L) 04/03/2019   ALKPHOS 89 04/03/2019   BILITOT 0.5 04/03/2019     Microbiology: Recent Results (from the past 240 hour(s))  Culture, blood (Routine x 2)     Status: Abnormal   Collection Time: 04/03/19  3:30 PM   Specimen: BLOOD  Result Value Ref Range Status   Specimen Description BLOOD RIGHT ANTECUBITAL  Final   Special Requests   Final    BOTTLES DRAWN AEROBIC AND ANAEROBIC Blood Culture results may not be optimal due to an inadequate volume of blood received in culture bottles   Culture  Setup Time   Final    IN BOTH AEROBIC AND ANAEROBIC BOTTLES GRAM POSITIVE COCCI Organism  ID to follow CRITICAL RESULT CALLED TO, READ BACK BY AND VERIFIED WITH: Milford Cage [id number] Performed at [facility], [address] [place], [place] [address]    Culture METHICILLIN RESISTANT STAPHYLOCOCCUS AUREUS (A)  Final   Report Status 04/06/2019 FINAL  Final   Organism ID, Bacteria METHICILLIN RESISTANT STAPHYLOCOCCUS AUREUS  Final      Susceptibility   Methicillin resistant staphylococcus aureus - MIC*    CIPROFLOXACIN >=8 RESISTANT Resistant     ERYTHROMYCIN >=8 RESISTANT Resistant     GENTAMICIN <=0.5 SENSITIVE Sensitive     OXACILLIN >=4 RESISTANT Resistant     TETRACYCLINE >=16 RESISTANT Resistant     VANCOMYCIN 1 SENSITIVE Sensitive     TRIMETH/SULFA <=10 SENSITIVE Sensitive     CLINDAMYCIN >=8 RESISTANT Resistant     RIFAMPIN <=0.5 SENSITIVE Sensitive     Inducible Clindamycin NEGATIVE Sensitive     * METHICILLIN RESISTANT STAPHYLOCOCCUS AUREUS  Blood Culture ID Panel (Reflexed)     Status: Abnormal   Collection Time: 04/03/19  3:30 PM  Result Value Ref Range Status   Enterococcus species NOT DETECTED NOT DETECTED Final   Listeria monocytogenes NOT DETECTED NOT DETECTED Final   Staphylococcus species DETECTED (A) NOT DETECTED Final    Comment: CRITICAL RESULT CALLED TO, READ BACK BY AND VERIFIED WITH: PHARMD T BAUMEISTER [id number] MLM    Staphylococcus aureus (BCID) DETECTED (A) NOT DETECTED Final    Comment: Methicillin (oxacillin)-resistant Staphylococcus aureus (MRSA). MRSA is predictably resistant to beta-lactam antibiotics (except ceftaroline). Preferred therapy is vancomycin unless clinically contraindicated. Patient requires contact precautions if  hospitalized. CRITICAL RESULT CALLED TO, READ BACK BY AND VERIFIED WITH: PHARMD T BAUMEISTER [id number] MLM    Methicillin resistance DETECTED (A) NOT DETECTED Final    Comment: CRITICAL RESULT CALLED TO, READ BACK BY AND VERIFIED WITH: PHARMD T BAUMEISTER [id number] MLM     Streptococcus species NOT DETECTED NOT DETECTED Final   Streptococcus agalactiae NOT DETECTED NOT DETECTED Final   Streptococcus pneumoniae NOT DETECTED NOT DETECTED Final   Streptococcus pyogenes NOT DETECTED NOT DETECTED Final   Acinetobacter baumannii NOT DETECTED NOT DETECTED Final   Enterobacteriaceae species NOT DETECTED NOT DETECTED Final   Enterobacter cloacae complex NOT DETECTED NOT DETECTED Final   Escherichia coli NOT DETECTED NOT DETECTED Final   Klebsiella oxytoca NOT DETECTED NOT DETECTED Final   Klebsiella pneumoniae NOT DETECTED NOT DETECTED Final  Proteus species NOT DETECTED NOT DETECTED Final   Serratia marcescens NOT DETECTED NOT DETECTED Final   Haemophilus influenzae NOT DETECTED NOT DETECTED Final   Neisseria meningitidis NOT DETECTED NOT DETECTED Final   Pseudomonas aeruginosa NOT DETECTED NOT DETECTED Final   Candida albicans NOT DETECTED NOT DETECTED Final   Candida glabrata NOT DETECTED NOT DETECTED Final   Candida krusei NOT DETECTED NOT DETECTED Final   Candida parapsilosis NOT DETECTED NOT DETECTED Final   Candida tropicalis NOT DETECTED NOT DETECTED Final    Comment: Performed at [facility], [place] [address], [place], [place] [address]  Culture, blood (Routine x 2)     Status: Abnormal   Collection Time: 04/03/19  5:06 PM   Specimen: BLOOD RIGHT FOREARM  Result Value Ref Range Status   Specimen Description BLOOD RIGHT FOREARM  Final   Special Requests   Final    BOTTLES DRAWN AEROBIC AND ANAEROBIC Blood Culture results may not be optimal due to an inadequate volume of blood received in culture bottles   Culture  Setup Time   Final    GRAM POSITIVE COCCI IN BOTH AEROBIC AND ANAEROBIC BOTTLES CRITICAL VALUE NOTED.  VALUE IS CONSISTENT WITH PREVIOUSLY REPORTED AND CALLED VALUE.    Culture (A)  Final    STAPHYLOCOCCUS AUREUS SUSCEPTIBILITIES PERFORMED ON PREVIOUS CULTURE WITHIN THE LAST 5 DAYS.    Report Status 04/06/2019 FINAL  Final  Urine  culture     Status: Abnormal   Collection Time: 04/03/19  5:14 PM   Specimen: Urine, Random  Result Value Ref Range Status   Specimen Description URINE, RANDOM  Final   Special Requests   Final    NONE Performed at [facility], [address] [place], [place] [address]    Culture (A)  Final    >=100,000 COLONIES/mL KLEBSIELLA PNEUMONIAE >=100,000 COLONIES/mL PROTEUS MIRABILIS    Report Status 04/06/2019 FINAL  Final   Organism ID, Bacteria KLEBSIELLA PNEUMONIAE (A)  Final   Organism ID, Bacteria PROTEUS MIRABILIS (A)  Final      Susceptibility   Klebsiella pneumoniae - MIC*    AMPICILLIN >=32 RESISTANT Resistant     CEFAZOLIN <=4 SENSITIVE Sensitive     CEFTRIAXONE <=1 SENSITIVE Sensitive     CIPROFLOXACIN 1 SENSITIVE Sensitive     GENTAMICIN <=1 SENSITIVE Sensitive     IMIPENEM <=0.25 SENSITIVE Sensitive     NITROFURANTOIN 64 INTERMEDIATE Intermediate     TRIMETH/SULFA >=320 RESISTANT Resistant     AMPICILLIN/SULBACTAM 8 SENSITIVE Sensitive     PIP/TAZO 8 SENSITIVE Sensitive     Extended ESBL POSITIVE Resistant     * >=100,000 COLONIES/mL KLEBSIELLA PNEUMONIAE   Proteus mirabilis - MIC*    AMPICILLIN <=2 SENSITIVE Sensitive     CEFAZOLIN <=4 SENSITIVE Sensitive     CEFTRIAXONE <=1 SENSITIVE Sensitive     CIPROFLOXACIN <=0.25 SENSITIVE Sensitive     GENTAMICIN <=1 SENSITIVE Sensitive     IMIPENEM 2 SENSITIVE Sensitive     NITROFURANTOIN 128 RESISTANT Resistant     TRIMETH/SULFA <=20 SENSITIVE Sensitive     AMPICILLIN/SULBACTAM <=2 SENSITIVE Sensitive     PIP/TAZO <=4 SENSITIVE Sensitive     * >=100,000 COLONIES/mL PROTEUS MIRABILIS  SARS Coronavirus 2 [facility] order, Performed in [facility] hospital lab) Nasopharyngeal Nasopharyngeal Swab     Status: None   Collection Time: 04/03/19  5:34 PM   Specimen: Nasopharyngeal Swab  Result Value Ref Range Status   SARS Coronavirus 2 NEGATIVE  NEGATIVE Final    Comment: (NOTE) If result is NEGATIVE SARS-CoV-2 target  nucleic acids are NOT DETECTED. The SARS-CoV-2 RNA is generally detectable in upper and lower  respiratory specimens during the acute phase of infection. The lowest  concentration of SARS-CoV-2 viral copies this assay can detect is 250  copies / mL. A negative result does not preclude SARS-CoV-2 infection  and should not be used as the sole basis for treatment or other  patient management decisions.  A negative result may occur with  improper specimen collection / handling, submission of specimen other  than nasopharyngeal swab, presence of viral mutation(s) within the  areas targeted by this assay, and inadequate number of viral copies  (<250 copies / mL). A negative result must be combined with clinical  observations, patient history, and epidemiological information. If result is POSITIVE SARS-CoV-2 target nucleic acids are DETECTED. The SARS-CoV-2 RNA is generally detectable in upper and lower  respiratory specimens dur ing the acute phase of infection.  Positive  results are indicative of active infection with SARS-CoV-2.  Clinical  correlation with patient history and other diagnostic information is  necessary to determine patient infection status.  Positive results do  not rule out bacterial infection or co-infection with other viruses. If result is PRESUMPTIVE POSTIVE SARS-CoV-2 nucleic acids MAY BE PRESENT.   A presumptive positive result was obtained on the submitted specimen  and confirmed on repeat testing.  While 2019 novel coronavirus  (SARS-CoV-2) nucleic acids may be present in the submitted sample  additional confirmatory testing may be necessary for epidemiological  and / or clinical management purposes  to differentiate between  SARS-CoV-2 and other Sarbecovirus currently known to infect humans.  If clinically indicated additional testing with an alternate test  methodology [phone]) is advised. The SARS-CoV-2 RNA is generally  detectable in upper and lower  respiratory sp ecimens during the acute  phase of infection. The expected result is Negative. Fact Sheet for Patients:  StrictlyIdeas.no Fact Sheet for Healthcare Providers: BankingDealers.co.za This test is not yet approved or cleared by the [place] FDA and has been authorized for detection and/or diagnosis of SARS-CoV-2 by FDA under an Emergency Use Authorization (EUA).  This EUA will remain in effect (meaning this test can be used) for the duration of the COVID-19 declaration under Section 564(b)(1) of the Act, 21 U.S.C. section 360bbb-3(b)(1), unless the authorization is terminated or revoked sooner. Performed at [facility], [place] [address], [place], [place] [address]   MRSA PCR Screening     Status: Abnormal   Collection Time: 04/03/19  9:45 PM   Specimen: Nasal Mucosa; Nasopharyngeal  Result Value Ref Range Status   MRSA by PCR POSITIVE (A) NEGATIVE Final    Comment:        The GeneXpert MRSA Assay (FDA approved for NASAL specimens only), is one component of a comprehensive MRSA colonization surveillance program. It is not intended to diagnose MRSA infection nor to guide or monitor treatment for MRSA infections. RESULT CALLED TO, READ BACK BY AND VERIFIED WITH: Donalda Ewings RN 04/03/19 2333 JDW Performed at [facility] [address] [place], [place] [address]   Culture, blood (routine x 2)     Status: None   Collection Time: 04/05/19  7:10 AM   Specimen: BLOOD  Result Value Ref Range Status   Specimen Description BLOOD RIGHT ANTECUBITAL  Final   Special Requests   Final    BOTTLES DRAWN AEROBIC ONLY Blood Culture adequate  volume   Culture   Final    NO GROWTH 5 DAYS Performed at [facility], [place] [address] [place], [place] [address]    Report Status 04/10/2019 FINAL  Final  Culture, blood (routine x 2)     Status: None   Collection Time: 04/05/19  7:22 AM   Specimen: BLOOD  Result Value Ref  Range Status   Specimen Description BLOOD RIGHT ANTECUBITAL  Final   Special Requests   Final    BOTTLES DRAWN AEROBIC ONLY Blood Culture adequate volume   Culture   Final    NO GROWTH 5 DAYS Performed at [facility], [place] [address] [place], [place] [address]    Report Status 04/10/2019 FINAL  Final  Aerobic/Anaerobic Culture (surgical/deep wound)     Status: None (Preliminary result)   Collection Time: 04/07/19  2:24 PM   Specimen: Abscess  Result Value Ref Range Status   Specimen Description ABSCESS  Final   Special Requests PARASPINAL ASPIRATE  Final   Gram Stain   Final    ABUNDANT WBC PRESENT, PREDOMINANTLY PMN ABUNDANT GRAM POSITIVE COCCI Performed at [facility], [place] [address] [place] [address] [place], [place] [address]    Culture   Final    MODERATE METHICILLIN RESISTANT STAPHYLOCOCCUS AUREUS NO ANAEROBES ISOLATED; CULTURE IN PROGRESS FOR 5 DAYS    Report Status PENDING  Incomplete   Organism ID, Bacteria METHICILLIN RESISTANT STAPHYLOCOCCUS AUREUS  Final      Susceptibility   Methicillin resistant staphylococcus aureus - MIC*    CIPROFLOXACIN >=8 RESISTANT Resistant     ERYTHROMYCIN >=8 RESISTANT Resistant     GENTAMICIN <=0.5 SENSITIVE Sensitive     OXACILLIN >=4 RESISTANT Resistant     TETRACYCLINE >=16 RESISTANT Resistant     VANCOMYCIN 1 SENSITIVE Sensitive     TRIMETH/SULFA <=10 SENSITIVE Sensitive     CLINDAMYCIN >=8 RESISTANT Resistant     RIFAMPIN <=0.5 SENSITIVE Sensitive     Inducible Clindamycin NEGATIVE Sensitive     * MODERATE METHICILLIN RESISTANT STAPHYLOCOCCUS AUREUS    Michel Bickers, MD [facility] [phone] pager   [phone] cell 04/11/2019, 1:41 PM

## 2019-04-11 NOTE — Progress Notes (Signed)
Patient ID: Clarence Sims, male   DOB: [id number], 58 y.o.   MRN: [id number]  PROGRESS NOTE    Clarence Sims  [id number] DOB: 1960/10/06 DOA: 04/03/2019 PCP: Nicoletta Dress, MD   Brief Narrative:  58 year old male with history of paraplegia following spinal cord injury/infarction, chronic low back pain, history of osteomyelitis, pressure ulcers, prostate cancer, chronic indwelling Foley catheter which was changed about 2 weeks ago presented on 04/03/2019 with fevers and chills.  He was admitted for sepsis with possible urosepsis and infected sebaceous cyst on the back.  He was started on IV antibiotics Rocephin and vancomycin.  He was found to have MRSA bacteremia.  ID was consulted.  Rocephin was subsequently discontinued.  MRI of the spine was positive for epidural/paraspinal abscess for which IR was consulted for drain/biopsy which patient initially refused but subsequently underwent CT-guided aspiration.  Echo showed EF of 50 to 55% without any vegetation.  Assessment & Plan:   MRSA bacteremia Sepsis: Present on admission Epidural and paraspinal abscess Upper back abscess/infected sebaceous cyst -Blood cultures on admission is positive for MRSA.  Repeat blood cultures from 04/05/2019 is negative so far. -MRI of the spine showed vertebral osteo-, epidural and paraspinal abscess. Status post CT-guided paraspinal abscess aspiration by IR on 04/07/2019.  Cultures pending so far. -ID following.  He received oritavancin on 04/08/2019.  Was on IV vancomycin till 04/07/2019.  ID is recommending at least 6 weeks of IV vancomycin antibiotic therapy.  General surgery was consulted on 04/09/2019 as per ID recommendations and recommended to continue antibiotics and can wick with iodoform to keep the area open and draining with no current surgical intervention needed.  -Patient is more calm this morning but was aggressive and agitated overnight.  This morning he states that he is okay to stay in the  hospital for antibiotic treatment. -2D echo showed EF of 50 to 55% with no vegetation. -PICC line placed on 04/07/2019.  Episode of seizure  -EEG on 04/08/2019 showed no evidence of seizures. -MRI brain-no focal neuro deficits or changes seen in the brain, no stroke.  Does have chronic white matter changes.  Hyponatremia -Secondary to intravascular volume depletion.  Improved.  History of paraplegia with neurogenic bladder with chronic indwelling Foley catheter -Foley catheter was changed during this admission.  Outpatient follow-up.  Anemia of chronic disease and microcytic anemia -Status post 1 unit packed red cells transfusion on 04/05/2019.  Hemoglobin stable.  Monitor  History of hepatitis C  -Unknown if this was treated in the past  History of chronic pain and illicit drug use/substance abuse -Continue pain management along with bowel regimen  Diabetes mellitus type 2 uncontrolled with hyperglycemia -Increase Lantus to 25 units daily and NovoLog to 5 units 3 times daily with meals along with CBGs with SSI  Bipolar disorder -Continue BuSpar, fluoxetine and trazodone.   -Psychiatry evaluation appreciated.  Gabapentin has been started as per psychiatry recommendations.  Patient does not need inpatient psychiatric hospitalization as per psychiatry.  DVT prophylaxis:  Eliquis Code Status: Full Family Communication: Spoke to Diane Lutter/wife on phone on 04/10/2019 Disposition Plan: Might have to remain inpatient for 6 weeks for IV antibiotics  Consultants: ID/general surgery/IR  Procedures: CT-guided paraspinal abscess drainage on 04/07/2019 Echo on 04/05/2019 IMPRESSIONS    1. The left ventricle has low normal systolic function, with an ejection fraction of 50-55%. The cavity size was normal. Left ventricular diastolic Doppler parameters are consistent with impaired relaxation.  2. The right ventricle has  normal systolic function. The cavity was normal. There is no  increase in right ventricular wall thickness. Right ventricular systolic pressure is moderately elevated with an estimated pressure of 50.6 mmHg.  3. There is mild mitral annular calcification present. No evidence of mitral valve stenosis.  4. Tricuspid valve regurgitation is moderate.  5. No stenosis of the aortic valve.  6. The aorta is normal unless otherwise noted.  SUMMARY   No evidence for a vegetation.  Antimicrobials:  Anti-infectives (From admission, onward)   Start     Dose/Rate Route Frequency Ordered Stop   04/08/19 1600  Oritavancin Diphosphate (ORBACTIV) 1,200 mg in dextrose 5 % IVPB     1,200 mg 333.3 mL/hr over 180 Minutes Intravenous Once 04/08/19 1500 04/08/19 2121   04/04/19 1830  vancomycin (VANCOCIN) 1,250 mg in sodium chloride 0.9 % 250 mL IVPB  Status:  Discontinued     1,250 mg 166.7 mL/hr over 90 Minutes Intravenous Every 24 hours 04/03/19 2206 04/08/19 1453   04/03/19 2245  vancomycin (VANCOCIN) 500 mg in sodium chloride 0.9 % 100 mL IVPB     500 mg 100 mL/hr over 60 Minutes Intravenous NOW 04/03/19 2205 04/04/19 0119   04/03/19 2145  cefTRIAXone (ROCEPHIN) 2 g in sodium chloride 0.9 % 100 mL IVPB  Status:  Discontinued     2 g 200 mL/hr over 30 Minutes Intravenous Every 24 hours 04/03/19 2142 04/04/19 1236   04/03/19 1715  ceFEPIme (MAXIPIME) 2 g in sodium chloride 0.9 % 100 mL IVPB     2 g 200 mL/hr over 30 Minutes Intravenous  Once 04/03/19 1714 04/03/19 1845   04/03/19 1715  vancomycin (VANCOCIN) IVPB 1000 mg/200 mL premix     1,000 mg 200 mL/hr over 60 Minutes Intravenous  Once 04/03/19 1714 04/03/19 2046        Subjective: Patient seen and examined at bedside.  He is awake, does not want to talk much.  He is more calm and states that he is okay to stay in the hospital for antibiotic treatment.  As per nursing staff, he was agitated overnight and wanted to leave against medical advice again.  No overnight fever or vomiting reported.  Poor historian  and is intermittently drowsy.  Objective: Vitals:   04/10/19 0615 04/10/19 1340 04/10/19 2140 04/11/19 0437  BP: 98/69 104/72 101/68 123/79  Pulse: 92 93 (!) 106 (!) 102  Resp: 18 16 20  (!) 24  Temp: 98.4 F (36.9 C) 97.8 F (36.6 C) 98.7 F (37.1 C) 97.7 F (36.5 C)  TempSrc: Oral Oral Oral   SpO2: 94% 96% 94% 97%  Weight:      Height:        Intake/Output Summary (Last 24 hours) at 04/11/2019 0757 Last data filed at 04/10/2019 2231 Gross per 24 hour  Intake 970 ml  Output 1500 ml  Net -530 ml   Filed Weights   04/03/19 1642 04/04/19 0600  Weight: 68 kg 68.9 kg    Examination:  General exam:  No acute distress. Looks older than stated age.  Chronically ill.  Poor historian.  Looks intermittently drowsy. Respiratory system: Bilateral decreased breath sounds at bases, no wheezing Cardiovascular system:  Rate controlled, S1-S2 heard Gastrointestinal system: Abdomen is nondistended, soft and nontender. Normal bowel sounds heard. Extremities: No cyanosis, edema   Data Reviewed: I have personally reviewed following labs and imaging studies  CBC: Recent Labs  Lab 04/07/19 1111 04/08/19 0328 04/09/19 0540 04/10/19 0407 04/11/19 0444  WBC 10.4  8.8 10.6* 9.7 10.1  NEUTROABS  --   --  8.4*  --  PENDING  HGB 8.8* 8.2* 8.0* 7.9* 8.7*  HCT 28.9* 28.4* 27.7* 27.2* 30.0*  MCV 72.8* 75.5* 76.1* 75.8* 75.2*  PLT 409* 421* 407* 407* [id number]*   Basic Metabolic Panel: Recent Labs  Lab 04/07/19 1111 04/08/19 0328 04/09/19 0540 04/10/19 0407 04/11/19 0444  NA 138 137 134* 137 139  K 4.3 3.9 3.9 4.2 4.4  CL 105 104 104 103 103  CO2 24 24 24 25 27   GLUCOSE 164* 290* 225* 156* 184*  BUN 8 10 9 9 7   CREATININE 0.72 0.77 0.65 0.59* 0.52*  CALCIUM 8.6* 8.5* 8.1* 8.5* 8.8*  MG 1.9 2.0 1.8 1.8 2.0   GFR: Estimated Creatinine Clearance: 87.9 mL/min (A) (by C-G formula based on SCr of 0.52 mg/dL (L)). Liver Function Tests: No results for input(s): AST, ALT, ALKPHOS, BILITOT,  PROT, ALBUMIN in the last 168 hours. No results for input(s): LIPASE, AMYLASE in the last 168 hours. No results for input(s): AMMONIA in the last 168 hours. Coagulation Profile: No results for input(s): INR, PROTIME in the last 168 hours. Cardiac Enzymes: No results for input(s): CKTOTAL, CKMB, CKMBINDEX, TROPONINI in the last 168 hours. BNP (last 3 results) No results for input(s): PROBNP in the last 8760 hours. HbA1C: No results for input(s): HGBA1C in the last 72 hours. CBG: Recent Labs  Lab 04/09/19 2140 04/10/19 0813 04/10/19 1154 04/10/19 1656 04/10/19 2143  GLUCAP 254* 201* 355* 213* 263*   Lipid Profile: No results for input(s): CHOL, HDL, LDLCALC, TRIG, CHOLHDL, LDLDIRECT in the last 72 hours. Thyroid Function Tests: No results for input(s): TSH, T4TOTAL, FREET4, T3FREE, THYROIDAB in the last 72 hours. Anemia Panel: No results for input(s): VITAMINB12, FOLATE, FERRITIN, TIBC, IRON, RETICCTPCT in the last 72 hours. Sepsis Labs: Recent Labs  Lab 04/06/19 0210  PROCALCITON 0.17    Recent Results (from the past 240 hour(s))  Culture, blood (Routine x 2)     Status: Abnormal   Collection Time: 04/03/19  3:30 PM   Specimen: BLOOD  Result Value Ref Range Status   Specimen Description BLOOD RIGHT ANTECUBITAL  Final   Special Requests   Final    BOTTLES DRAWN AEROBIC AND ANAEROBIC Blood Culture results may not be optimal due to an inadequate volume of blood received in culture bottles   Culture  Setup Time   Final    IN BOTH AEROBIC AND ANAEROBIC BOTTLES GRAM POSITIVE COCCI Organism ID to follow CRITICAL RESULT CALLED TO, READ BACK BY AND VERIFIED WITH: Milford Cage [id number] Performed at [facility], [address], [place], [place] [address]    Culture METHICILLIN RESISTANT STAPHYLOCOCCUS AUREUS (A)  Final   Report Status 04/06/2019 FINAL  Final   Organism ID, Bacteria METHICILLIN RESISTANT STAPHYLOCOCCUS AUREUS  Final      Susceptibility    Methicillin resistant staphylococcus aureus - MIC*    CIPROFLOXACIN >=8 RESISTANT Resistant     ERYTHROMYCIN >=8 RESISTANT Resistant     GENTAMICIN <=0.5 SENSITIVE Sensitive     OXACILLIN >=4 RESISTANT Resistant     TETRACYCLINE >=16 RESISTANT Resistant     VANCOMYCIN 1 SENSITIVE Sensitive     TRIMETH/SULFA <=10 SENSITIVE Sensitive     CLINDAMYCIN >=8 RESISTANT Resistant     RIFAMPIN <=0.5 SENSITIVE Sensitive     Inducible Clindamycin NEGATIVE Sensitive     * METHICILLIN RESISTANT STAPHYLOCOCCUS AUREUS  Blood Culture ID Panel (Reflexed)  Status: Abnormal   Collection Time: 04/03/19  3:30 PM  Result Value Ref Range Status   Enterococcus species NOT DETECTED NOT DETECTED Final   Listeria monocytogenes NOT DETECTED NOT DETECTED Final   Staphylococcus species DETECTED (A) NOT DETECTED Final    Comment: CRITICAL RESULT CALLED TO, READ BACK BY AND VERIFIED WITH: PHARMD T BAUMEISTER [id number] MLM    Staphylococcus aureus (BCID) DETECTED (A) NOT DETECTED Final    Comment: Methicillin (oxacillin)-resistant Staphylococcus aureus (MRSA). MRSA is predictably resistant to beta-lactam antibiotics (except ceftaroline). Preferred therapy is vancomycin unless clinically contraindicated. Patient requires contact precautions if  hospitalized. CRITICAL RESULT CALLED TO, READ BACK BY AND VERIFIED WITH: PHARMD T BAUMEISTER [id number] MLM    Methicillin resistance DETECTED (A) NOT DETECTED Final    Comment: CRITICAL RESULT CALLED TO, READ BACK BY AND VERIFIED WITH: PHARMD T BAUMEISTER [id number] MLM    Streptococcus species NOT DETECTED NOT DETECTED Final   Streptococcus agalactiae NOT DETECTED NOT DETECTED Final   Streptococcus pneumoniae NOT DETECTED NOT DETECTED Final   Streptococcus pyogenes NOT DETECTED NOT DETECTED Final   Acinetobacter baumannii NOT DETECTED NOT DETECTED Final   Enterobacteriaceae species NOT DETECTED NOT DETECTED Final   Enterobacter cloacae complex NOT DETECTED NOT  DETECTED Final   Escherichia coli NOT DETECTED NOT DETECTED Final   Klebsiella oxytoca NOT DETECTED NOT DETECTED Final   Klebsiella pneumoniae NOT DETECTED NOT DETECTED Final   Proteus species NOT DETECTED NOT DETECTED Final   Serratia marcescens NOT DETECTED NOT DETECTED Final   Haemophilus influenzae NOT DETECTED NOT DETECTED Final   Neisseria meningitidis NOT DETECTED NOT DETECTED Final   Pseudomonas aeruginosa NOT DETECTED NOT DETECTED Final   Candida albicans NOT DETECTED NOT DETECTED Final   Candida glabrata NOT DETECTED NOT DETECTED Final   Candida krusei NOT DETECTED NOT DETECTED Final   Candida parapsilosis NOT DETECTED NOT DETECTED Final   Candida tropicalis NOT DETECTED NOT DETECTED Final    Comment: Performed at [facility], [place], [place], [place] [address]  Culture, blood (Routine x 2)     Status: Abnormal   Collection Time: 04/03/19  5:06 PM   Specimen: BLOOD RIGHT FOREARM  Result Value Ref Range Status   Specimen Description BLOOD RIGHT FOREARM  Final   Special Requests   Final    BOTTLES DRAWN AEROBIC AND ANAEROBIC Blood Culture results may not be optimal due to an inadequate volume of blood received in culture bottles   Culture  Setup Time   Final    GRAM POSITIVE COCCI IN BOTH AEROBIC AND ANAEROBIC BOTTLES CRITICAL VALUE NOTED.  VALUE IS CONSISTENT WITH PREVIOUSLY REPORTED AND CALLED VALUE.    Culture (A)  Final    STAPHYLOCOCCUS AUREUS SUSCEPTIBILITIES PERFORMED ON PREVIOUS CULTURE WITHIN THE LAST 5 DAYS.    Report Status 04/06/2019 FINAL  Final  Urine culture     Status: Abnormal   Collection Time: 04/03/19  5:14 PM   Specimen: Urine, Random  Result Value Ref Range Status   Specimen Description URINE, RANDOM  Final   Special Requests   Final    NONE Performed at [facility], [address], [place], [place] [address]    Culture (A)  Final    >=100,000 COLONIES/mL KLEBSIELLA PNEUMONIAE >=100,000 COLONIES/mL PROTEUS MIRABILIS     Report Status 04/06/2019 FINAL  Final   Organism ID, Bacteria KLEBSIELLA PNEUMONIAE (A)  Final   Organism ID, Bacteria PROTEUS MIRABILIS (A)  Final  Susceptibility   Klebsiella pneumoniae - MIC*    AMPICILLIN >=32 RESISTANT Resistant     CEFAZOLIN <=4 SENSITIVE Sensitive     CEFTRIAXONE <=1 SENSITIVE Sensitive     CIPROFLOXACIN 1 SENSITIVE Sensitive     GENTAMICIN <=1 SENSITIVE Sensitive     IMIPENEM <=0.25 SENSITIVE Sensitive     NITROFURANTOIN 64 INTERMEDIATE Intermediate     TRIMETH/SULFA >=320 RESISTANT Resistant     AMPICILLIN/SULBACTAM 8 SENSITIVE Sensitive     PIP/TAZO 8 SENSITIVE Sensitive     Extended ESBL POSITIVE Resistant     * >=100,000 COLONIES/mL KLEBSIELLA PNEUMONIAE   Proteus mirabilis - MIC*    AMPICILLIN <=2 SENSITIVE Sensitive     CEFAZOLIN <=4 SENSITIVE Sensitive     CEFTRIAXONE <=1 SENSITIVE Sensitive     CIPROFLOXACIN <=0.25 SENSITIVE Sensitive     GENTAMICIN <=1 SENSITIVE Sensitive     IMIPENEM 2 SENSITIVE Sensitive     NITROFURANTOIN 128 RESISTANT Resistant     TRIMETH/SULFA <=20 SENSITIVE Sensitive     AMPICILLIN/SULBACTAM <=2 SENSITIVE Sensitive     PIP/TAZO <=4 SENSITIVE Sensitive     * >=100,000 COLONIES/mL PROTEUS MIRABILIS  SARS Coronavirus 2 [facility] order, Performed in [place] [facility] lab) Nasopharyngeal Nasopharyngeal Swab     Status: None   Collection Time: 04/03/19  5:34 PM   Specimen: Nasopharyngeal Swab  Result Value Ref Range Status   SARS Coronavirus 2 NEGATIVE NEGATIVE Final    Comment: (NOTE) If result is NEGATIVE SARS-CoV-2 target nucleic acids are NOT DETECTED. The SARS-CoV-2 RNA is generally detectable in upper and lower  respiratory specimens during the acute phase of infection. The lowest  concentration of SARS-CoV-2 viral copies this assay can detect is 250  copies / mL. A negative result does not preclude SARS-CoV-2 infection  and should not be used as the sole basis for treatment or other  patient management  decisions.  A negative result may occur with  improper specimen collection / handling, submission of specimen other  than nasopharyngeal swab, presence of viral mutation(s) within the  areas targeted by this assay, and inadequate number of viral copies  (<250 copies / mL). A negative result must be combined with clinical  observations, patient history, and epidemiological information. If result is POSITIVE SARS-CoV-2 target nucleic acids are DETECTED. The SARS-CoV-2 RNA is generally detectable in upper and lower  respiratory specimens dur ing the acute phase of infection.  Positive  results are indicative of active infection with SARS-CoV-2.  Clinical  correlation with patient history and other diagnostic information is  necessary to determine patient infection status.  Positive results do  not rule out bacterial infection or co-infection with other viruses. If result is PRESUMPTIVE POSTIVE SARS-CoV-2 nucleic acids MAY BE PRESENT.   A presumptive positive result was obtained on the submitted specimen  and confirmed on repeat testing.  While 2019 novel coronavirus  (SARS-CoV-2) nucleic acids may be present in the submitted sample  additional confirmatory testing may be necessary for epidemiological  and / or clinical management purposes  to differentiate between  SARS-CoV-2 and other Sarbecovirus currently known to infect humans.  If clinically indicated additional testing with an alternate test  methodology [phone]) is advised. The SARS-CoV-2 RNA is generally  detectable in upper and lower respiratory sp ecimens during the acute  phase of infection. The expected result is Negative. Fact Sheet for Patients:  StrictlyIdeas.no Fact Sheet for Healthcare Providers: BankingDealers.co.za This test is not yet approved or cleared by the [place] FDA  and has been authorized for detection and/or diagnosis of SARS-CoV-2 by FDA under an  Emergency Use Authorization (EUA).  This EUA will remain in effect (meaning this test can be used) for the duration of the COVID-19 declaration under Section 564(b)(1) of the Act, 21 U.S.C. section 360bbb-3(b)(1), unless the authorization is terminated or revoked sooner. Performed at [facility], [place] [address], [place], [place] [address]   MRSA PCR Screening     Status: Abnormal   Collection Time: 04/03/19  9:45 PM   Specimen: Nasal Mucosa; Nasopharyngeal  Result Value Ref Range Status   MRSA by PCR POSITIVE (A) NEGATIVE Final    Comment:        The GeneXpert MRSA Assay (FDA approved for NASAL specimens only), is one component of a comprehensive MRSA colonization surveillance program. It is not intended to diagnose MRSA infection nor to guide or monitor treatment for MRSA infections. RESULT CALLED TO, READ BACK BY AND VERIFIED WITH: Donalda Ewings RN 04/03/19 2333 JDW Performed at [place] [address], [place], [place] [address]   Culture, blood (routine x 2)     Status: None   Collection Time: 04/05/19  7:10 AM   Specimen: BLOOD  Result Value Ref Range Status   Specimen Description BLOOD RIGHT ANTECUBITAL  Final   Special Requests   Final    BOTTLES DRAWN AEROBIC ONLY Blood Culture adequate volume   Culture   Final    NO GROWTH 5 DAYS Performed at [facility], [place] [address] [place], [place] [address]    Report Status 04/10/2019 FINAL  Final  Culture, blood (routine x 2)     Status: None   Collection Time: 04/05/19  7:22 AM   Specimen: BLOOD  Result Value Ref Range Status   Specimen Description BLOOD RIGHT ANTECUBITAL  Final   Special Requests   Final    BOTTLES DRAWN AEROBIC ONLY Blood Culture adequate volume   Culture   Final    NO GROWTH 5 DAYS Performed at [facility], [place] [address], [place], [place] [address]    Report Status 04/10/2019 FINAL  Final  Aerobic/Anaerobic Culture (surgical/deep wound)     Status: None (Preliminary  result)   Collection Time: 04/07/19  2:24 PM   Specimen: Abscess  Result Value Ref Range Status   Specimen Description ABSCESS  Final   Special Requests PARASPINAL ASPIRATE  Final   Gram Stain   Final    ABUNDANT WBC PRESENT, PREDOMINANTLY PMN ABUNDANT GRAM POSITIVE COCCI Performed at [facility], [place] [address], [place], [place] [address]    Culture   Final    MODERATE METHICILLIN RESISTANT STAPHYLOCOCCUS AUREUS NO ANAEROBES ISOLATED; CULTURE IN PROGRESS FOR 5 DAYS    Report Status PENDING  Incomplete   Organism ID, Bacteria METHICILLIN RESISTANT STAPHYLOCOCCUS AUREUS  Final      Susceptibility   Methicillin resistant staphylococcus aureus - MIC*    CIPROFLOXACIN >=8 RESISTANT Resistant     ERYTHROMYCIN >=8 RESISTANT Resistant     GENTAMICIN <=0.5 SENSITIVE Sensitive     OXACILLIN >=4 RESISTANT Resistant     TETRACYCLINE >=16 RESISTANT Resistant     VANCOMYCIN 1 SENSITIVE Sensitive     TRIMETH/SULFA <=10 SENSITIVE Sensitive     CLINDAMYCIN >=8 RESISTANT Resistant     RIFAMPIN <=0.5 SENSITIVE Sensitive     Inducible Clindamycin NEGATIVE Sensitive     * MODERATE METHICILLIN RESISTANT STAPHYLOCOCCUS AUREUS  Radiology Studies: No results found.      Scheduled Meds: . sodium chloride   Intravenous Once  . apixaban  5 mg Oral BID  . baclofen  40 mg Oral TID  . busPIRone  10 mg Oral TID  . Chlorhexidine Gluconate Cloth  6 each Topical Q0600  . diltiazem  60 mg Oral Q8H  . ferrous sulfate  325 mg Oral BID WC  . FLUoxetine  40 mg Oral Daily  . gabapentin  100 mg Oral TID  . insulin aspart  0-15 Units Subcutaneous TID WC  . insulin aspart  0-5 Units Subcutaneous QHS  . insulin aspart  4 Units Subcutaneous TID WC  . insulin glargine  20 Units Subcutaneous Daily  . linaclotide  145 mcg Oral QAC breakfast  . multivitamin with minerals  1 tablet Oral Daily  . mupirocin ointment   Topical Daily  . nicotine  14 mg Transdermal Daily  . nutrition supplement  (JUVEN)  1 packet Oral BID BM  . nystatin  5 mL Oral QID  . Ensure Max Protein  11 oz Oral Daily  . traZODone  150 mg Oral QHS   Continuous Infusions:    LOS: 8 days        Aline August, MD [facility] 04/11/2019, 7:57 AM

## 2019-04-12 DIAGNOSIS — M6289 Other specified disorders of muscle: Secondary | ICD-10-CM

## 2019-04-12 LAB — CBC WITH DIFFERENTIAL/PLATELET
Abs Immature Granulocytes: 0.12 10*3/uL — ABNORMAL HIGH (ref 0.00–0.07)
Basophils Absolute: 0 10*3/uL (ref 0.0–0.1)
Basophils Relative: 0 %
Eosinophils Absolute: 0.1 10*3/uL (ref 0.0–0.5)
Eosinophils Relative: 1 %
HCT: 28.8 % — ABNORMAL LOW (ref 39.0–52.0)
Hemoglobin: 8.5 g/dL — ABNORMAL LOW (ref 13.0–17.0)
Immature Granulocytes: 1 %
Lymphocytes Relative: 13 %
Lymphs Abs: 1.4 10*3/uL (ref 0.7–4.0)
MCH: 22 pg — ABNORMAL LOW (ref 26.0–34.0)
MCHC: 29.5 g/dL — ABNORMAL LOW (ref 30.0–36.0)
MCV: 74.4 fL — ABNORMAL LOW (ref 80.0–100.0)
Monocytes Absolute: 1 10*3/uL (ref 0.1–1.0)
Monocytes Relative: 9 %
Neutro Abs: 8.4 10*3/uL — ABNORMAL HIGH (ref 1.7–7.7)
Neutrophils Relative %: 76 %
Platelets: 462 10*3/uL — ABNORMAL HIGH (ref 150–400)
RBC: 3.87 MIL/uL — ABNORMAL LOW (ref 4.22–5.81)
RDW: 23.7 % — ABNORMAL HIGH (ref 11.5–15.5)
WBC: 11 10*3/uL — ABNORMAL HIGH (ref 4.0–10.5)
nRBC: 0 % (ref 0.0–0.2)

## 2019-04-12 LAB — AEROBIC/ANAEROBIC CULTURE W GRAM STAIN (SURGICAL/DEEP WOUND)

## 2019-04-12 LAB — BASIC METABOLIC PANEL
Anion gap: 9 (ref 5–15)
BUN: 18 mg/dL (ref 6–20)
CO2: 28 mmol/L (ref 22–32)
Calcium: 8.8 mg/dL — ABNORMAL LOW (ref 8.9–10.3)
Chloride: 98 mmol/L (ref 98–111)
Creatinine, Ser: 0.76 mg/dL (ref 0.61–1.24)
GFR calc Af Amer: >60 mL/min (ref >60)
GFR calc non Af Amer: >60 mL/min (ref >60)
Glucose, Bld: 193 mg/dL — ABNORMAL HIGH (ref 70–99)
Potassium: 4.4 mmol/L (ref 3.5–5.1)
Sodium: 135 mmol/L (ref 135–145)

## 2019-04-12 LAB — C-REACTIVE PROTEIN: CRP: 9.7 mg/dL — ABNORMAL HIGH (ref <1.0)

## 2019-04-12 LAB — GLUCOSE, CAPILLARY: Glucose-Capillary: 199 mg/dL — ABNORMAL HIGH (ref 70–99)

## 2019-04-12 LAB — MAGNESIUM: Magnesium: 2.1 mg/dL (ref 1.7–2.4)

## 2019-04-12 MED ORDER — BISACODYL 10 MG RE SUPP: 10.0000 mg | Freq: Every day | RECTAL | Status: DC | PRN

## 2019-04-12 MED ORDER — POLYETHYLENE GLYCOL 3350 17 G PO PACK: 17.0000 g | PACK | Freq: Two times a day (BID) | ORAL | Status: DC

## 2019-04-12 MED ORDER — FLEET ENEMA 7-19 GM/118ML RE ENEM: 1.0000 | ENEMA | Freq: Every day | RECTAL | Status: DC | PRN

## 2019-04-12 NOTE — Progress Notes (Signed)
Patient being verbally abusive with staff and is throwing medical equipment around room. Security called to bedside.

## 2019-04-12 NOTE — Progress Notes (Signed)
Patient very adamant he wants to leave AMA; papers signed. Unfortunately, patient does not have a ride home and is a paraplegic. Security at bedside.

## 2019-04-12 NOTE — Progress Notes (Addendum)
Patient's sister Dorene called in attempt to get patient a ride home. Per patient's sister, the patient was told he "had" to sign out against medical advice and we told him to "get out of our hospital." Dorene explained that the patient was refusing any further medical treatment and wished to remove himself from our facility. Nursing staff never made those comments and only tried to de-escalate the patient and encourage him to stay for treatment. She stated that we "will be hearing from her and her lawyer." Patient's sister notified that the patient will be waiting in outside the emergency department entrance. Sister is very upset, stating "you can't just leave him outside on a bench." She demanded that we should find a ride home for him. This RN explained our policy regarding AMA discharges and that it's up to the patient and/or family to facilitate the [facility] discharge rides.   Security talked to sister once again and Dorene agreed to come get the patient from outside the ED. Transported down with RN and security guards.

## 2019-04-12 NOTE — Progress Notes (Signed)
Patient is verbalizing agitation about pain management. States he desires medication. However, pain medication parameters have not been met for Percocet. Patient advised he can get an alternative pain medication. Patient declined Tylenol or Morphine at this time.   Patient is verbalizing concerns about digital stimulation to Dr. Starla Link. Dr. Starla Link ordered to administer PRN laxative and stool softener.

## 2019-04-12 NOTE — Progress Notes (Signed)
Patient saying derogatory and racist comments towards staff members. Unable to de-escalate the situation. Called patient's wife and transferred it into the room in attempt to de-escalate the patient.

## 2019-04-12 NOTE — Progress Notes (Signed)
PICC line removed per order and line is intact. Vaseline gauze and gauze dressing applied and pressure held. Site clean, dry, and intact. Patient and RN aware that patient is on bedrest for 30 minutes. Patient aware to leave dressing on and dry for 24 hours.  

## 2019-04-12 NOTE — Progress Notes (Signed)
Got ahold of patient's wife, Shauna Hugh. Per Diane, the patient nor her have money to pay for a cab. Diane stated she cannot get ahold of any family members or friends to assist with transportation home.

## 2019-04-12 NOTE — Discharge Summary (Addendum)
Physician Discharge Summary  Clarence Sims Thiam [id number] DOB: 07-30-1961 DOA: 04/03/2019  PCP: Nicoletta Dress, MD  Admit date: 04/03/2019 Discharge date: 04/12/2019  Admitted From: Home Disposition: Signed out [place]  Discharge Condition: Guarded to Poor CODE STATUS: Full   Brief/Interim Summary: 58 year old male with history of paraplegia following spinal cord injury/infarction, chronic low back pain, history of osteomyelitis, pressure ulcers, prostate cancer, chronic indwelling Foley catheter which was changed about 2 weeks ago presented on 04/03/2019 with fevers and chills.  He was admitted for sepsis with possible urosepsis and infected sebaceous cyst on the back.  He was started on IV antibiotics Rocephin and vancomycin.  He was found to have MRSA bacteremia.  ID was consulted.  Rocephin was subsequently discontinued.  MRI of the spine was positive for epidural/paraspinal abscess for which IR was consulted for drain/biopsy which patient initially refused but subsequently underwent CT-guided aspiration.  Echo showed EF of 50 to 55% without any vegetation.  ID recommended 6 weeks of IV antibiotic treatment inpatient.  Patient intermittently showed very aggressive and abusive behavior towards nursing staff as well as me and would intermittently threatening to leave [place].  He signed out [place] today despite knowing the consequences of doing so including worsening of medical condition and even death.  Discharge Diagnoses:   MRSA bacteremia Sepsis: Present on admission Epidural and paraspinal abscess Upper back abscess/infected sebaceous cyst -Blood cultures on admission was positive for MRSA.  Repeat blood cultures from 04/05/2019 is negative so far. -MRI of the spine showed vertebral osteo-, epidural and paraspinal abscess. Status post CT-guided paraspinal abscess aspiration by IR on 04/07/2019.  Cultures also grew MRSA. -ID following.  He  received oritavancin on 04/08/2019.  Was on IV vancomycin till 04/07/2019.  ID is recommending at least 6 weeks of IV vancomycin antibiotic therapy.  General surgery was consulted on 04/09/2019 as per ID recommendations and recommended to continue antibiotics and can wick with iodoform to keep the area open and draining with no current surgical intervention needed.  -2D echo showed EF of 50 to 55% with no vegetation. -PICC line placed on 04/07/2019. -Currently afebrile.  CRP 9.7 -Patient intermittently showed very aggressive and abusive behavior towards nursing staff as well as me and would intermittently threatening to leave [place].  He signed out [place] today despite knowing the consequences of doing so including worsening of medical condition and even death. -Patient today was very adamant that he be manually disimpacted.  As per the nursing staff, he has been asking for the same almost every day.  I offered scheduled MiraLAX along with stool softeners and if needed Dulcolax suppository and/or enema.  He became extremely angry and started banging on his breakfast table and became very aggressive.  He eventually signed himself out [place].   -PICC line was removed prior to him signing out AMA.  Episode of seizure  -EEG on 04/08/2019 showed no evidence of seizures. -MRI brain-no focal neuro deficits or changes seen in the brain, no stroke. Does have chronic white matter changes.  Hyponatremia -Secondary to intravascular volume depletion.  Improved.  History of paraplegia with neurogenic bladder with chronic indwelling Foley catheter -Foley catheter was changed during this admission.  Outpatient follow-up.  Anemia of chronic disease and microcytic anemia -Status post 1 unit packed red cells transfusion on 04/05/2019.  Hemoglobin stable.   History of hepatitis C  -Unknown if this was treated in the past  History of chronic pain and illicit drug  use/substance abuse -Outpatient follow-up  Diabetes mellitus type 2 uncontrolled with hyperglycemia -Treated with Lantus, NovoLog and CBGs with SSI.  Outpatient follow-up  Bipolar disorder -Outpatient follow-up -Psychiatry evaluation appreciated.  Gabapentin was started as per psychiatry recommendations.  Patient did not need inpatient psychiatric hospitalization as per psychiatry. -Patient is capable of deciding for himself.   Allergies  Allergen Reactions  . Novocain [Procaine] Other (See Comments)    Unknown reaction    Consultations:  ID/IR/psychiatry/general surgery   Procedures/Studies: Dg Chest 2 View  Result Date: 04/03/2019 CLINICAL DATA:  Chest pain and shortness of breath EXAM: CHEST - 2 VIEW COMPARISON:  May 13, 2017 FINDINGS: The heart size and mediastinal contours are within normal limits. The aorta is tortuous. There is minimal right pleural effusion. There is no focal infiltrate or pulmonary edema. No acute abnormalities identified in the bony structures. IMPRESSION: No focal pneumonia. Minimal right pleural effusion. Electronically Signed   By: Abelardo Diesel M.D.   On: 04/03/2019 16:13   Mr Brain Wo Contrast  Result Date: 04/06/2019 CLINICAL DATA:  Encephalopathy.  Seizure. EXAM: MRI HEAD WITHOUT CONTRAST TECHNIQUE: Multiplanar, multiecho pulse sequences of the brain and surrounding structures were obtained without intravenous contrast. COMPARISON:  CT head without contrast 12/16/2014. FINDINGS: Brain: The study is moderately degraded by patient motion. The diffusion-weighted images demonstrate no acute or subacute infarction. Scattered white matter changes are mildly advanced for age. The internal auditory canals are within normal limits. The brainstem and cerebellum are within normal limits. Vascular: Flow is present in the major intracranial arteries. Skull and upper cervical spine: Degenerative changes are present in the upper cervical spine. There is slight  anterolisthesis at C2-3. Leftward disc osteophyte complex is present at C3-4. Sinuses/Orbits: The paranasal sinuses and mastoid air cells are clear. The globes and orbits are within normal limits. IMPRESSION: 1. No acute or focal abnormality to explain seizures or encephalopathy. 2. Scattered white matter changes are mildly advanced for age. Electronically Signed   By: San Morelle M.D.   On: 04/06/2019 14:06   Mr Lumbar Spine W Wo Contrast  Result Date: 04/05/2019 CLINICAL DATA:  MRSA bacteremia, paraplegia, spinal cord injury previously. Chronic low back pain. EXAM: MRI LUMBAR SPINE WITHOUT AND WITH CONTRAST TECHNIQUE: Multiplanar and multiecho pulse sequences of the lumbar spine were obtained without and with intravenous contrast. CONTRAST:  7 mL Gadavist COMPARISON:  None. FINDINGS: Segmentation:  Standard. Alignment:  Physiologic. Vertebrae:  No acute fracture.  No aggressive osseous lesion. Fluid signal within the L4-5 disc space with marrow edema on either side of the disc space within the vertebral bodies and peripheral disc enhancement most concerning for discitis-osteomyelitis. 1.7 x 4 x 3.7 cm complex peripherally enhancing fluid collection in the left paraspinal muscle with an adjacent 1.5 x 2 cm fluid collection in the left paraspinal muscle abutting the facet joint along the posterior margin. Similar 1.2 x 2.2 cm complex fluid collection with peripheral enhancement in the right paraspinal muscle adjacent to the spinous process. Epidural enhancement along the ventral and dorsal aspect with a small peripherally enhancing fluid collection along the dorsal aspect of the thecal sac measuring 1.2 x 0.8 cm at the level of L4 most concerning for a small abscess. Inflammatory changes and the abscess result in overall severe spinal stenosis. Conus medullaris and cauda equina: Conus extends to the L2 level. Conus and cauda equina appear normal. Paraspinal and other soft tissues: No other paraspinal  abnormality.  Disc levels: Disc spaces: Degenerative disc disease with disc height loss at L5-S1 and L1-2. T12-L1: No significant disc bulge. No evidence of neural foraminal stenosis. No central canal stenosis. L1-L2: Mild broad-based disc bulge. Moderate bilateral facet arthropathy. No evidence of neural foraminal stenosis. No central canal stenosis. L2-L3: No significant disc bulge. No evidence of neural foraminal stenosis. No central canal stenosis. Mild bilateral facet arthropathy. L3-L4: Mild broad-based disc bulge. Moderate bilateral facet arthropathy. Moderate spinal stenosis related to the epidural phlegmonous changes. No evidence of neural foraminal stenosis. L4-L5: No disc protrusion. Severe spinal stenosis resulting from the epidural phlegmonous changes and the dorsal epidural abscess measuring 1.2 x 0.8 cm. Moderate bilateral facet arthropathy. Moderate bilateral foraminal stenosis. L5-S1: Broad-based disc osteophyte complex. Severe bilateral scratch them moderate bilateral facet arthropathy. Moderate bilateral foraminal stenosis. IMPRESSION: 1. Discitis-osteomyelitis at L4-5 with epidural phlegmonous changes and a 1.2 x 0.8 cm epidural abscess along the dorsal aspect at the level of L4. Severe spinal stenosis at L4-5 resulting from the phlegmonous changes and facet arthropathy. Infectious myositis of the multifidus muscle posteriorly with right and left intramuscular abscesses. The largest intramuscular abscess in the left multifidus muscle measures 1.7 x 4 x 3.7 cm. 2. Lumbar spine spondylosis as described above. Electronically Signed   By: Kathreen Devoid   On: 04/05/2019 15:31   Ct Aspiration  Result Date: 04/07/2019 CLINICAL DATA:  Bacteremia. History of spinal cord injury and paraplegia. Discitis-osteomyelitis at L4-5 with epidural phlegmonous changes and a 1.2 x 0.8 cm epidural abscess along the dorsal aspect at the level of L4. Severe spinal stenosis at L4-5 resulting from the phlegmonous changes  and facet arthropathy. Infectious myositis of the multifidus muscle posteriorly with right and left intramuscular abscesses. The largest intramuscular abscess in the left multifidus muscle measures 1.7 x 4 x 3.7 cm. EXAM: CT GUIDED NEEDLE ASPIRATE BIOPSY OF PARASPINAL ABSCESS ANESTHESIA/SEDATION: Intravenous Fentanyl 36mcg and Versed 1mg  were administered as conscious sedation during continuous monitoring of the patient's level of consciousness and physiological / cardiorespiratory status by the radiology RN, with a total moderate sedation time of 10 minutes. PROCEDURE: The procedure risks, benefits, and alternatives were explained to the patient. Questions regarding the procedure were encouraged and answered. The patient understands and consents to the procedure. Patient placed prone. Select axial scans through the lumbar region obtained. The dominant left paraspinal collection was localized and an appropriate skin entry site determined and marked. The operative field was prepped with chlorhexidinein a sterile fashion, and a sterile drape was applied covering the operative field. A sterile gown and sterile gloves were used for the procedure. Local anesthesia was provided with 1% Lidocaine. Under CT fluoroscopic guidance, 18 gauge percutaneous entry needle advanced into the collection. Approximately 16 mL of purulent material were aspirated, sent for Gram stain and culture. Postprocedure scans show no hemorrhage or other apparent complication. The patient tolerated the procedure well. COMPLICATIONS: None immediate FINDINGS: Left paraspinal abscess was again localized. 16 mL purulent material aspirated under CT guidance. IMPRESSION: 1. Technically successful CT-guided paraspinal abscess aspiration; 16 mL aspirate sent for Gram stain and culture. Electronically Signed   By: Lucrezia Europe M.D.   On: 04/07/2019 15:07   Dg Chest Port 1 View  Result Date: 04/06/2019 CLINICAL DATA:  Shortness of breath EXAM: PORTABLE  CHEST 1 VIEW COMPARISON:  Chest radiograph 04/03/2019 FINDINGS: Monitoring leads overlie the patient. Stable cardiac and mediastinal contours. Patchy bilateral lower lung opacities. No pleural effusion or pneumothorax. IMPRESSION: Heterogeneous opacities right greater  than left lower lungs may represent atelectasis. Infection not excluded. Electronically Signed   By: Lovey Newcomer M.D.   On: 04/06/2019 11:58   [place] Ekg Site Rite  Result Date: 04/07/2019 If Site Rite image not attached, placement could not be confirmed due to current cardiac rhythm.      The results of significant diagnostics from this hospitalization (including imaging, microbiology, ancillary and laboratory) are listed below for reference.     Microbiology: Recent Results (from the past 240 hour(s))  Culture, blood (Routine x 2)     Status: Abnormal   Collection Time: 04/03/19  3:30 PM   Specimen: BLOOD  Result Value Ref Range Status   Specimen Description BLOOD RIGHT ANTECUBITAL  Final   Special Requests   Final    BOTTLES DRAWN AEROBIC AND ANAEROBIC Blood Culture results may not be optimal due to an inadequate volume of blood received in culture bottles   Culture  Setup Time   Final    IN BOTH AEROBIC AND ANAEROBIC BOTTLES GRAM POSITIVE COCCI Organism ID to follow CRITICAL RESULT CALLED TO, READ BACK BY AND VERIFIED WITH: Milford Cage [id number] Performed at [facility], [place] [address] [place], [place] [address]    Culture METHICILLIN RESISTANT STAPHYLOCOCCUS AUREUS (A)  Final   Report Status 04/06/2019 FINAL  Final   Organism ID, Bacteria METHICILLIN RESISTANT STAPHYLOCOCCUS AUREUS  Final      Susceptibility   Methicillin resistant staphylococcus aureus - MIC*    CIPROFLOXACIN >=8 RESISTANT Resistant     ERYTHROMYCIN >=8 RESISTANT Resistant     GENTAMICIN <=0.5 SENSITIVE Sensitive     OXACILLIN >=4 RESISTANT Resistant     TETRACYCLINE >=16 RESISTANT Resistant     VANCOMYCIN 1 SENSITIVE  Sensitive     TRIMETH/SULFA <=10 SENSITIVE Sensitive     CLINDAMYCIN >=8 RESISTANT Resistant     RIFAMPIN <=0.5 SENSITIVE Sensitive     Inducible Clindamycin NEGATIVE Sensitive     * METHICILLIN RESISTANT STAPHYLOCOCCUS AUREUS  Blood Culture ID Panel (Reflexed)     Status: Abnormal   Collection Time: 04/03/19  3:30 PM  Result Value Ref Range Status   Enterococcus species NOT DETECTED NOT DETECTED Final   Listeria monocytogenes NOT DETECTED NOT DETECTED Final   Staphylococcus species DETECTED (A) NOT DETECTED Final    Comment: CRITICAL RESULT CALLED TO, READ BACK BY AND VERIFIED WITH: PHARMD T BAUMEISTER [id number] MLM    Staphylococcus aureus (BCID) DETECTED (A) NOT DETECTED Final    Comment: Methicillin (oxacillin)-resistant Staphylococcus aureus (MRSA). MRSA is predictably resistant to beta-lactam antibiotics (except ceftaroline). Preferred therapy is vancomycin unless clinically contraindicated. Patient requires contact precautions if  hospitalized. CRITICAL RESULT CALLED TO, READ BACK BY AND VERIFIED WITH: PHARMD T BAUMEISTER [id number] MLM    Methicillin resistance DETECTED (A) NOT DETECTED Final    Comment: CRITICAL RESULT CALLED TO, READ BACK BY AND VERIFIED WITH: PHARMD T BAUMEISTER [id number] MLM    Streptococcus species NOT DETECTED NOT DETECTED Final   Streptococcus agalactiae NOT DETECTED NOT DETECTED Final   Streptococcus pneumoniae NOT DETECTED NOT DETECTED Final   Streptococcus pyogenes NOT DETECTED NOT DETECTED Final   Acinetobacter baumannii NOT DETECTED NOT DETECTED Final   Enterobacteriaceae species NOT DETECTED NOT DETECTED Final   Enterobacter cloacae complex NOT DETECTED NOT DETECTED Final   Escherichia coli NOT DETECTED NOT DETECTED Final   Klebsiella oxytoca NOT DETECTED NOT DETECTED Final   Klebsiella pneumoniae NOT DETECTED NOT DETECTED Final  Proteus species NOT DETECTED NOT DETECTED Final   Serratia marcescens NOT DETECTED NOT DETECTED Final    Haemophilus influenzae NOT DETECTED NOT DETECTED Final   Neisseria meningitidis NOT DETECTED NOT DETECTED Final   Pseudomonas aeruginosa NOT DETECTED NOT DETECTED Final   Candida albicans NOT DETECTED NOT DETECTED Final   Candida glabrata NOT DETECTED NOT DETECTED Final   Candida krusei NOT DETECTED NOT DETECTED Final   Candida parapsilosis NOT DETECTED NOT DETECTED Final   Candida tropicalis NOT DETECTED NOT DETECTED Final    Comment: Performed at [facility], [place] [address], [place], [place] [address]  Culture, blood (Routine x 2)     Status: Abnormal   Collection Time: 04/03/19  5:06 PM   Specimen: BLOOD RIGHT FOREARM  Result Value Ref Range Status   Specimen Description BLOOD RIGHT FOREARM  Final   Special Requests   Final    BOTTLES DRAWN AEROBIC AND ANAEROBIC Blood Culture results may not be optimal due to an inadequate volume of blood received in culture bottles   Culture  Setup Time   Final    GRAM POSITIVE COCCI IN BOTH AEROBIC AND ANAEROBIC BOTTLES CRITICAL VALUE NOTED.  VALUE IS CONSISTENT WITH PREVIOUSLY REPORTED AND CALLED VALUE.    Culture (A)  Final    STAPHYLOCOCCUS AUREUS SUSCEPTIBILITIES PERFORMED ON PREVIOUS CULTURE WITHIN THE LAST 5 DAYS.    Report Status 04/06/2019 FINAL  Final  Urine culture     Status: Abnormal   Collection Time: 04/03/19  5:14 PM   Specimen: Urine, Random  Result Value Ref Range Status   Specimen Description URINE, RANDOM  Final   Special Requests   Final    NONE Performed at [facility], [address], [place], [place] [address]    Culture (A)  Final    >=100,000 COLONIES/mL KLEBSIELLA PNEUMONIAE >=100,000 COLONIES/mL PROTEUS MIRABILIS    Report Status 04/06/2019 FINAL  Final   Organism ID, Bacteria KLEBSIELLA PNEUMONIAE (A)  Final   Organism ID, Bacteria PROTEUS MIRABILIS (A)  Final      Susceptibility   Klebsiella pneumoniae - MIC*    AMPICILLIN >=32 RESISTANT Resistant     CEFAZOLIN <=4 SENSITIVE Sensitive      CEFTRIAXONE <=1 SENSITIVE Sensitive     CIPROFLOXACIN 1 SENSITIVE Sensitive     GENTAMICIN <=1 SENSITIVE Sensitive     IMIPENEM <=0.25 SENSITIVE Sensitive     NITROFURANTOIN 64 INTERMEDIATE Intermediate     TRIMETH/SULFA >=320 RESISTANT Resistant     AMPICILLIN/SULBACTAM 8 SENSITIVE Sensitive     PIP/TAZO 8 SENSITIVE Sensitive     Extended ESBL POSITIVE Resistant     * >=100,000 COLONIES/mL KLEBSIELLA PNEUMONIAE   Proteus mirabilis - MIC*    AMPICILLIN <=2 SENSITIVE Sensitive     CEFAZOLIN <=4 SENSITIVE Sensitive     CEFTRIAXONE <=1 SENSITIVE Sensitive     CIPROFLOXACIN <=0.25 SENSITIVE Sensitive     GENTAMICIN <=1 SENSITIVE Sensitive     IMIPENEM 2 SENSITIVE Sensitive     NITROFURANTOIN 128 RESISTANT Resistant     TRIMETH/SULFA <=20 SENSITIVE Sensitive     AMPICILLIN/SULBACTAM <=2 SENSITIVE Sensitive     PIP/TAZO <=4 SENSITIVE Sensitive     * >=100,000 COLONIES/mL PROTEUS MIRABILIS  SARS Coronavirus 2 [facility] order, Performed in [facility] hospital lab) Nasopharyngeal Nasopharyngeal Swab     Status: None   Collection Time: 04/03/19  5:34 PM   Specimen: Nasopharyngeal Swab  Result Value Ref Range Status   SARS Coronavirus 2  NEGATIVE NEGATIVE Final    Comment: (NOTE) If result is NEGATIVE SARS-CoV-2 target nucleic acids are NOT DETECTED. The SARS-CoV-2 RNA is generally detectable in upper and lower  respiratory specimens during the acute phase of infection. The lowest  concentration of SARS-CoV-2 viral copies this assay can detect is 250  copies / mL. A negative result does not preclude SARS-CoV-2 infection  and should not be used as the sole basis for treatment or other  patient management decisions.  A negative result may occur with  improper specimen collection / handling, submission of specimen other  than nasopharyngeal swab, presence of viral mutation(s) within the  areas targeted by this assay, and inadequate number of viral copies  (<250 copies / mL). A negative  result must be combined with clinical  observations, patient history, and epidemiological information. If result is POSITIVE SARS-CoV-2 target nucleic acids are DETECTED. The SARS-CoV-2 RNA is generally detectable in upper and lower  respiratory specimens dur ing the acute phase of infection.  Positive  results are indicative of active infection with SARS-CoV-2.  Clinical  correlation with patient history and other diagnostic information is  necessary to determine patient infection status.  Positive results do  not rule out bacterial infection or co-infection with other viruses. If result is PRESUMPTIVE POSTIVE SARS-CoV-2 nucleic acids MAY BE PRESENT.   A presumptive positive result was obtained on the submitted specimen  and confirmed on repeat testing.  While 2019 novel coronavirus  (SARS-CoV-2) nucleic acids may be present in the submitted sample  additional confirmatory testing may be necessary for epidemiological  and / or clinical management purposes  to differentiate between  SARS-CoV-2 and other Sarbecovirus currently known to infect humans.  If clinically indicated additional testing with an alternate test  methodology [phone]) is advised. The SARS-CoV-2 RNA is generally  detectable in upper and lower respiratory sp ecimens during the acute  phase of infection. The expected result is Negative. Fact Sheet for Patients:  StrictlyIdeas.no Fact Sheet for Healthcare Providers: BankingDealers.co.za This test is not yet approved or cleared by the [place] FDA and has been authorized for detection and/or diagnosis of SARS-CoV-2 by FDA under an Emergency Use Authorization (EUA).  This EUA will remain in effect (meaning this test can be used) for the duration of the COVID-19 declaration under Section 564(b)(1) of the Act, 21 U.S.C. section 360bbb-3(b)(1), unless the authorization is terminated or revoked sooner. Performed at [facility], [place] [address] [place], [place] [address]   MRSA PCR Screening     Status: Abnormal   Collection Time: 04/03/19  9:45 PM   Specimen: Nasal Mucosa; Nasopharyngeal  Result Value Ref Range Status   MRSA by PCR POSITIVE (A) NEGATIVE Final    Comment:        The GeneXpert MRSA Assay (FDA approved for NASAL specimens only), is one component of a comprehensive MRSA colonization surveillance program. It is not intended to diagnose MRSA infection nor to guide or monitor treatment for MRSA infections. RESULT CALLED TO, READ BACK BY AND VERIFIED WITH: Donalda Ewings RN 04/03/19 [id number] JDW Performed at [place] [address], [place],  [address]   Culture, blood (routine x 2)     Status: None   Collection Time: 04/05/19  7:10 AM   Specimen: BLOOD  Result Value Ref Range Status   Specimen Description BLOOD RIGHT ANTECUBITAL  Final   Special Requests   Final    BOTTLES DRAWN AEROBIC ONLY Blood Culture  adequate volume   Culture   Final    NO GROWTH 5 DAYS Performed at [facility], [place] [address] [place] [address] [place], [place] [address]    Report Status 04/10/2019 FINAL  Final  Culture, blood (routine x 2)     Status: None   Collection Time: 04/05/19  7:22 AM   Specimen: BLOOD  Result Value Ref Range Status   Specimen Description BLOOD RIGHT ANTECUBITAL  Final   Special Requests   Final    BOTTLES DRAWN AEROBIC ONLY Blood Culture adequate volume   Culture   Final    NO GROWTH 5 DAYS Performed at [facility], [place] [address] [place] [address] [place], [place] [address]    Report Status 04/10/2019 FINAL  Final  Aerobic/Anaerobic Culture (surgical/deep wound)     Status: None (Preliminary result)   Collection Time: 04/07/19  2:24 PM   Specimen: Abscess  Result Value Ref Range Status   Specimen Description ABSCESS  Final   Special Requests PARASPINAL ASPIRATE  Final   Gram Stain   Final    ABUNDANT WBC PRESENT, PREDOMINANTLY PMN ABUNDANT GRAM POSITIVE COCCI Performed at  [facility], [place] [address] [place], [place] [address]    Culture   Final    MODERATE METHICILLIN RESISTANT STAPHYLOCOCCUS AUREUS NO ANAEROBES ISOLATED; CULTURE IN PROGRESS FOR 5 DAYS    Report Status PENDING  Incomplete   Organism ID, Bacteria METHICILLIN RESISTANT STAPHYLOCOCCUS AUREUS  Final      Susceptibility   Methicillin resistant staphylococcus aureus - MIC*    CIPROFLOXACIN >=8 RESISTANT Resistant     ERYTHROMYCIN >=8 RESISTANT Resistant     GENTAMICIN <=0.5 SENSITIVE Sensitive     OXACILLIN >=4 RESISTANT Resistant     TETRACYCLINE >=16 RESISTANT Resistant     VANCOMYCIN 1 SENSITIVE Sensitive     TRIMETH/SULFA <=10 SENSITIVE Sensitive     CLINDAMYCIN >=8 RESISTANT Resistant     RIFAMPIN <=0.5 SENSITIVE Sensitive     Inducible Clindamycin NEGATIVE Sensitive     * MODERATE METHICILLIN RESISTANT STAPHYLOCOCCUS AUREUS     Labs: BNP (last 3 results) Recent Labs    04/06/19 0210  BNP [id number]   Basic Metabolic Panel: Recent Labs  Lab 04/08/19 0328 04/09/19 0540 04/10/19 0407 04/11/19 0444 04/12/19 0345  NA 137 134* 137 139 135  K 3.9 3.9 4.2 4.4 4.4  CL 104 104 103 103 98  CO2 24 24 25 27 28   GLUCOSE 290* 225* 156* 184* 193*  BUN 10 9 9 7 18   CREATININE 0.77 0.65 0.59* 0.52* 0.76  CALCIUM 8.5* 8.1* 8.5* 8.8* 8.8*  MG 2.0 1.8 1.8 2.0 2.1   Liver Function Tests: No results for input(s): AST, ALT, ALKPHOS, BILITOT, PROT, ALBUMIN in the last 168 hours. No results for input(s): LIPASE, AMYLASE in the last 168 hours. No results for input(s): AMMONIA in the last 168 hours. CBC: Recent Labs  Lab 04/08/19 0328 04/09/19 0540 04/10/19 0407 04/11/19 0444 04/12/19 0345  WBC 8.8 10.6* 9.7 10.1 11.0*  NEUTROABS  --  8.4*  --  7.3 8.4*  HGB 8.2* 8.0* 7.9* 8.7* 8.5*  HCT 28.4* 27.7* 27.2* 30.0* 28.8*  MCV 75.5* 76.1* 75.8* 75.2* 74.4*  PLT 421* 407* 407* 488* 462*   Cardiac Enzymes: No results for input(s): CKTOTAL, CKMB, CKMBINDEX, TROPONINI in the last 168  hours. BNP: Invalid input(s): POCBNP CBG: Recent Labs  Lab 04/11/19 1004 04/11/19 1354 04/11/19 1711 04/11/19 2252 04/12/19 0841  GLUCAP 254* 163* 180* 114*  199*   D-Dimer No results for input(s): DDIMER in the last 72 hours. Hgb A1c No results for input(s): HGBA1C in the last 72 hours. Lipid Profile No results for input(s): CHOL, HDL, LDLCALC, TRIG, CHOLHDL, LDLDIRECT in the last 72 hours. Thyroid function studies No results for input(s): TSH, T4TOTAL, T3FREE, THYROIDAB in the last 72 hours.  Invalid input(s): FREET3 Anemia work up No results for input(s): VITAMINB12, FOLATE, FERRITIN, TIBC, IRON, RETICCTPCT in the last 72 hours. Urinalysis    Component Value Date/Time   COLORURINE YELLOW 04/03/2019 1629   APPEARANCEUR TURBID (A) 04/03/2019 1629   LABSPEC 1.022 04/03/2019 1629   PHURINE 8.0 04/03/2019 1629   GLUCOSEU >=500 (A) 04/03/2019 1629   HGBUR NEGATIVE 04/03/2019 1629   BILIRUBINUR NEGATIVE 04/03/2019 1629   KETONESUR NEGATIVE 04/03/2019 1629   PROTEINUR 30 (A) 04/03/2019 1629   UROBILINOGEN 0.2 05/05/2012 0500   NITRITE POSITIVE (A) 04/03/2019 1629   LEUKOCYTESUR LARGE (A) 04/03/2019 1629   Sepsis Labs Invalid input(s): PROCALCITONIN,  WBC,  LACTICIDVEN Microbiology Recent Results (from the past 240 hour(s))  Culture, blood (Routine x 2)     Status: Abnormal   Collection Time: 04/03/19  3:30 PM   Specimen: BLOOD  Result Value Ref Range Status   Specimen Description BLOOD RIGHT ANTECUBITAL  Final   Special Requests   Final    BOTTLES DRAWN AEROBIC AND ANAEROBIC Blood Culture results may not be optimal due to an inadequate volume of blood received in culture bottles   Culture  Setup Time   Final    IN BOTH AEROBIC AND ANAEROBIC BOTTLES GRAM POSITIVE COCCI Organism ID to follow CRITICAL RESULT CALLED TO, READ BACK BY AND VERIFIED WITH: Milford Cage [id number] Performed at [facility], [address], [place], [place] [address]     Culture METHICILLIN RESISTANT STAPHYLOCOCCUS AUREUS (A)  Final   Report Status 04/06/2019 FINAL  Final   Organism ID, Bacteria METHICILLIN RESISTANT STAPHYLOCOCCUS AUREUS  Final      Susceptibility   Methicillin resistant staphylococcus aureus - MIC*    CIPROFLOXACIN >=8 RESISTANT Resistant     ERYTHROMYCIN >=8 RESISTANT Resistant     GENTAMICIN <=0.5 SENSITIVE Sensitive     OXACILLIN >=4 RESISTANT Resistant     TETRACYCLINE >=16 RESISTANT Resistant     VANCOMYCIN 1 SENSITIVE Sensitive     TRIMETH/SULFA <=10 SENSITIVE Sensitive     CLINDAMYCIN >=8 RESISTANT Resistant     RIFAMPIN <=0.5 SENSITIVE Sensitive     Inducible Clindamycin NEGATIVE Sensitive     * METHICILLIN RESISTANT STAPHYLOCOCCUS AUREUS  Blood Culture ID Panel (Reflexed)     Status: Abnormal   Collection Time: 04/03/19  3:30 PM  Result Value Ref Range Status   Enterococcus species NOT DETECTED NOT DETECTED Final   Listeria monocytogenes NOT DETECTED NOT DETECTED Final   Staphylococcus species DETECTED (A) NOT DETECTED Final    Comment: CRITICAL RESULT CALLED TO, READ BACK BY AND VERIFIED WITH: PHARMD T BAUMEISTER [id number] MLM    Staphylococcus aureus (BCID) DETECTED (A) NOT DETECTED Final    Comment: Methicillin (oxacillin)-resistant Staphylococcus aureus (MRSA). MRSA is predictably resistant to beta-lactam antibiotics (except ceftaroline). Preferred therapy is vancomycin unless clinically contraindicated. Patient requires contact precautions if  hospitalized. CRITICAL RESULT CALLED TO, READ BACK BY AND VERIFIED WITH: PHARMD T BAUMEISTER [id number] MLM    Methicillin resistance DETECTED (A) NOT DETECTED Final    Comment: CRITICAL RESULT CALLED TO, READ BACK BY AND VERIFIED WITH: PHARMD  T BAUMEISTER [id number] MLM    Streptococcus species NOT DETECTED NOT DETECTED Final   Streptococcus agalactiae NOT DETECTED NOT DETECTED Final   Streptococcus pneumoniae NOT DETECTED NOT DETECTED Final   Streptococcus pyogenes  NOT DETECTED NOT DETECTED Final   Acinetobacter baumannii NOT DETECTED NOT DETECTED Final   Enterobacteriaceae species NOT DETECTED NOT DETECTED Final   Enterobacter cloacae complex NOT DETECTED NOT DETECTED Final   Escherichia coli NOT DETECTED NOT DETECTED Final   Klebsiella oxytoca NOT DETECTED NOT DETECTED Final   Klebsiella pneumoniae NOT DETECTED NOT DETECTED Final   Proteus species NOT DETECTED NOT DETECTED Final   Serratia marcescens NOT DETECTED NOT DETECTED Final   Haemophilus influenzae NOT DETECTED NOT DETECTED Final   Neisseria meningitidis NOT DETECTED NOT DETECTED Final   Pseudomonas aeruginosa NOT DETECTED NOT DETECTED Final   Candida albicans NOT DETECTED NOT DETECTED Final   Candida glabrata NOT DETECTED NOT DETECTED Final   Candida krusei NOT DETECTED NOT DETECTED Final   Candida parapsilosis NOT DETECTED NOT DETECTED Final   Candida tropicalis NOT DETECTED NOT DETECTED Final    Comment: Performed at [facility], [place] [address], [place], [place] [address]  Culture, blood (Routine x 2)     Status: Abnormal   Collection Time: 04/03/19  5:06 PM   Specimen: BLOOD RIGHT FOREARM  Result Value Ref Range Status   Specimen Description BLOOD RIGHT FOREARM  Final   Special Requests   Final    BOTTLES DRAWN AEROBIC AND ANAEROBIC Blood Culture results may not be optimal due to an inadequate volume of blood received in culture bottles   Culture  Setup Time   Final    GRAM POSITIVE COCCI IN BOTH AEROBIC AND ANAEROBIC BOTTLES CRITICAL VALUE NOTED.  VALUE IS CONSISTENT WITH PREVIOUSLY REPORTED AND CALLED VALUE.    Culture (A)  Final    STAPHYLOCOCCUS AUREUS SUSCEPTIBILITIES PERFORMED ON PREVIOUS CULTURE WITHIN THE LAST 5 DAYS.    Report Status 04/06/2019 FINAL  Final  Urine culture     Status: Abnormal   Collection Time: 04/03/19  5:14 PM   Specimen: Urine, Random  Result Value Ref Range Status   Specimen Description URINE, RANDOM  Final   Special Requests   Final     NONE Performed at [facility], [address] [place], [place] [address]    Culture (A)  Final    >=100,000 COLONIES/mL KLEBSIELLA PNEUMONIAE >=100,000 COLONIES/mL PROTEUS MIRABILIS    Report Status 04/06/2019 FINAL  Final   Organism ID, Bacteria KLEBSIELLA PNEUMONIAE (A)  Final   Organism ID, Bacteria PROTEUS MIRABILIS (A)  Final      Susceptibility   Klebsiella pneumoniae - MIC*    AMPICILLIN >=32 RESISTANT Resistant     CEFAZOLIN <=4 SENSITIVE Sensitive     CEFTRIAXONE <=1 SENSITIVE Sensitive     CIPROFLOXACIN 1 SENSITIVE Sensitive     GENTAMICIN <=1 SENSITIVE Sensitive     IMIPENEM <=0.25 SENSITIVE Sensitive     NITROFURANTOIN 64 INTERMEDIATE Intermediate     TRIMETH/SULFA >=320 RESISTANT Resistant     AMPICILLIN/SULBACTAM 8 SENSITIVE Sensitive     PIP/TAZO 8 SENSITIVE Sensitive     Extended ESBL POSITIVE Resistant     * >=100,000 COLONIES/mL KLEBSIELLA PNEUMONIAE   Proteus mirabilis - MIC*    AMPICILLIN <=2 SENSITIVE Sensitive     CEFAZOLIN <=4 SENSITIVE Sensitive     CEFTRIAXONE <=1 SENSITIVE Sensitive     CIPROFLOXACIN <=0.25 SENSITIVE Sensitive  GENTAMICIN <=1 SENSITIVE Sensitive     IMIPENEM 2 SENSITIVE Sensitive     NITROFURANTOIN 128 RESISTANT Resistant     TRIMETH/SULFA <=20 SENSITIVE Sensitive     AMPICILLIN/SULBACTAM <=2 SENSITIVE Sensitive     PIP/TAZO <=4 SENSITIVE Sensitive     * >=100,000 COLONIES/mL PROTEUS MIRABILIS  SARS Coronavirus 2 [facility] order, Performed in [facility] hospital lab) Nasopharyngeal Nasopharyngeal Swab     Status: None   Collection Time: 04/03/19  5:34 PM   Specimen: Nasopharyngeal Swab  Result Value Ref Range Status   SARS Coronavirus 2 NEGATIVE NEGATIVE Final    Comment: (NOTE) If result is NEGATIVE SARS-CoV-2 target nucleic acids are NOT DETECTED. The SARS-CoV-2 RNA is generally detectable in upper and lower  respiratory specimens during the acute phase of infection. The lowest  concentration of SARS-CoV-2 viral  copies this assay can detect is 250  copies / mL. A negative result does not preclude SARS-CoV-2 infection  and should not be used as the sole basis for treatment or other  patient management decisions.  A negative result may occur with  improper specimen collection / handling, submission of specimen other  than nasopharyngeal swab, presence of viral mutation(s) within the  areas targeted by this assay, and inadequate number of viral copies  (<250 copies / mL). A negative result must be combined with clinical  observations, patient history, and epidemiological information. If result is POSITIVE SARS-CoV-2 target nucleic acids are DETECTED. The SARS-CoV-2 RNA is generally detectable in upper and lower  respiratory specimens dur ing the acute phase of infection.  Positive  results are indicative of active infection with SARS-CoV-2.  Clinical  correlation with patient history and other diagnostic information is  necessary to determine patient infection status.  Positive results do  not rule out bacterial infection or co-infection with other viruses. If result is PRESUMPTIVE POSTIVE SARS-CoV-2 nucleic acids MAY BE PRESENT.   A presumptive positive result was obtained on the submitted specimen  and confirmed on repeat testing.  While 2019 novel coronavirus  (SARS-CoV-2) nucleic acids may be present in the submitted sample  additional confirmatory testing may be necessary for epidemiological  and / or clinical management purposes  to differentiate between  SARS-CoV-2 and other Sarbecovirus currently known to infect humans.  If clinically indicated additional testing with an alternate test  methodology [phone]) is advised. The SARS-CoV-2 RNA is generally  detectable in upper and lower respiratory sp ecimens during the acute  phase of infection. The expected result is Negative. Fact Sheet for Patients:  StrictlyIdeas.no Fact Sheet for Healthcare  Providers: BankingDealers.co.za This test is not yet approved or cleared by the [place] FDA and has been authorized for detection and/or diagnosis of SARS-CoV-2 by FDA under an Emergency Use Authorization (EUA).  This EUA will remain in effect (meaning this test can be used) for the duration of the COVID-19 declaration under Section 564(b)(1) of the Act, 21 U.S.C. section 360bbb-3(b)(1), unless the authorization is terminated or revoked sooner. Performed at [facility], [place] [address] [place], [place] [address]   MRSA PCR Screening     Status: Abnormal   Collection Time: 04/03/19  9:45 PM   Specimen: Nasal Mucosa; Nasopharyngeal  Result Value Ref Range Status   MRSA by PCR POSITIVE (A) NEGATIVE Final    Comment:        The GeneXpert MRSA Assay (FDA approved for NASAL specimens only), is one component of a comprehensive MRSA colonization surveillance program. It is not  intended to diagnose MRSA infection nor to guide or monitor treatment for MRSA infections. RESULT CALLED TO, READ BACK BY AND VERIFIED WITH: Donalda Ewings RN 04/03/19 2333 JDW Performed at [place] [address] [place], [place] [address]   Culture, blood (routine x 2)     Status: None   Collection Time: 04/05/19  7:10 AM   Specimen: BLOOD  Result Value Ref Range Status   Specimen Description BLOOD RIGHT ANTECUBITAL  Final   Special Requests   Final    BOTTLES DRAWN AEROBIC ONLY Blood Culture adequate volume   Culture   Final    NO GROWTH 5 DAYS Performed at [facility], [place] [address] [place], [place] [address]    Report Status 04/10/2019 FINAL  Final  Culture, blood (routine x 2)     Status: None   Collection Time: 04/05/19  7:22 AM   Specimen: BLOOD  Result Value Ref Range Status   Specimen Description BLOOD RIGHT ANTECUBITAL  Final   Special Requests   Final    BOTTLES DRAWN AEROBIC ONLY Blood Culture adequate volume   Culture   Final    NO GROWTH 5  DAYS Performed at [facility], [place] [address] [place], [place] [address]    Report Status 04/10/2019 FINAL  Final  Aerobic/Anaerobic Culture (surgical/deep wound)     Status: None (Preliminary result)   Collection Time: 04/07/19  2:24 PM   Specimen: Abscess  Result Value Ref Range Status   Specimen Description ABSCESS  Final   Special Requests PARASPINAL ASPIRATE  Final   Gram Stain   Final    ABUNDANT WBC PRESENT, PREDOMINANTLY PMN ABUNDANT GRAM POSITIVE COCCI Performed at [facility], [place] [address], [place], [place] [address]    Culture   Final    MODERATE METHICILLIN RESISTANT STAPHYLOCOCCUS AUREUS NO ANAEROBES ISOLATED; CULTURE IN PROGRESS FOR 5 DAYS    Report Status PENDING  Incomplete   Organism ID, Bacteria METHICILLIN RESISTANT STAPHYLOCOCCUS AUREUS  Final      Susceptibility   Methicillin resistant staphylococcus aureus - MIC*    CIPROFLOXACIN >=8 RESISTANT Resistant     ERYTHROMYCIN >=8 RESISTANT Resistant     GENTAMICIN <=0.5 SENSITIVE Sensitive     OXACILLIN >=4 RESISTANT Resistant     TETRACYCLINE >=16 RESISTANT Resistant     VANCOMYCIN 1 SENSITIVE Sensitive     TRIMETH/SULFA <=10 SENSITIVE Sensitive     CLINDAMYCIN >=8 RESISTANT Resistant     RIFAMPIN <=0.5 SENSITIVE Sensitive     Inducible Clindamycin NEGATIVE Sensitive     * MODERATE METHICILLIN RESISTANT STAPHYLOCOCCUS AUREUS     SIGNED:   Aline August, MD  [facility] 04/12/2019, 1:41 PM

## 2019-04-12 NOTE — Progress Notes (Addendum)
Date: 04/12/2019 Patient: Clarence Sims Admitted: [id number]  3:36 PM Attending Provider: Betsy Pries Sims or his authorized caregiver has made the decision for the patient to leave the hospital against the advice of Clarence Sims.  He or his authorized caregiver has been informed and understands the inherent risks, including death.  He or his authorized caregiver has decided to accept the responsibility for this decision. Clarence Sims and all necessary parties have been advised that he may return for further evaluation or treatment. His condition at time of discharge was Stable.  Clarence Sims had current vital signs as follows:  Blood pressure 109/69, pulse 97, temperature 97.7 F (36.5 C), temperature source Oral, resp. rate 18, height 5\' 2"  (1.575 m), weight 68.9 kg, SpO2 94 %.   Clarence Sims or his authorized caregiver has signed the Leaving Against Medical Advice form prior to leaving the department.  Clarence Comber, RN 04/12/2019 11:16 AM

## 2019-04-12 NOTE — Progress Notes (Signed)
Pt MEWS at 2123 yellow due to slightly elevated HR. This is not acute change, pt in no distress. WIll continue to monitor pt closely.

## 2019-04-14 ENCOUNTER — Telehealth (HOSPITAL_COMMUNITY): Payer: Self-pay | Admitting: Pharmacist

## 2019-04-14 MED ORDER — SULFAMETHOXAZOLE-TRIMETHOPRIM 800-160 MG PO TABS: 1.0000 | ORAL_TABLET | Freq: Two times a day (BID) | ORAL | 0 refills | Status: DC

## 2019-04-14 NOTE — Telephone Encounter (Signed)
I am in agreement with this plan. 

## 2019-04-14 NOTE — Progress Notes (Addendum)
[facility] Pharmacy Progress Note   I spoke with Mr. Pariso on the phone today with his wife. He expressed that he was having significant pain in his back and some regret about leaving the hospital.   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 He was agreeable to this plan so I sent in a 5 week supply of Bactrim to [facility] pharmacy of choice[facility].    I also discussed having a follow-up appointment with Dr. Megan Salon and he was also agreeable. I scheduled an appointment for him on 9/21 at 11:15 AM with Dr. Megan Salon.    I made sure that he wrote down the appointment information with the office address and phone number in case he has any questions. His wife is also aware of the appointment.   I reinforced the importance of picking up the Bactrim as soon as possible and to start taking it immediately at the end of our conversation. He expressed understanding and noted that he may go back to a different hospital outside of the [facility] system if the pain worsens.    Jimmy Footman, PharmD, BCPS, BCIDP Infectious Diseases Clinical Pharmacist Phone: [phone] 04/14/2019 10:46 AM

## 2019-05-05 ENCOUNTER — Ambulatory Visit: Payer: Medicaid Other | Admitting: Internal Medicine

## 2019-05-12 ENCOUNTER — Inpatient Hospital Stay: Payer: Medicaid Other | Admitting: Internal Medicine

## 2019-05-26 DIAGNOSIS — I959 Hypotension, unspecified: Secondary | ICD-10-CM | POA: Insufficient documentation

## 2019-08-14 ENCOUNTER — Other Ambulatory Visit: Payer: Self-pay | Admitting: Internal Medicine

## 2019-08-14 ENCOUNTER — Telehealth: Payer: Self-pay

## 2019-08-14 NOTE — Telephone Encounter (Signed)
Received refill request for Bactrim DS 160 MG tabs. Patient was seen in hospital by MD; no showed follow appointments with our office in September. Will forward message to MD to advise if follow up appointment is okay and if refill is okay. [place]

## 2019-08-14 NOTE — Telephone Encounter (Signed)
Please schedule a f/u visit with me and give refills to last until then. Thanks.

## 2019-08-18 ENCOUNTER — Other Ambulatory Visit: Payer: Self-pay

## 2019-08-18 MED ORDER — SULFAMETHOXAZOLE-TRIMETHOPRIM 800-160 MG PO TABS: 1.0000 | ORAL_TABLET | Freq: Two times a day (BID) | ORAL | 0 refills | Status: AC

## 2019-12-18 ENCOUNTER — Other Ambulatory Visit: Payer: Self-pay

## 2019-12-18 ENCOUNTER — Other Ambulatory Visit: Payer: Self-pay | Admitting: Sports Medicine

## 2019-12-18 ENCOUNTER — Encounter: Payer: Self-pay | Admitting: Sports Medicine

## 2019-12-18 ENCOUNTER — Ambulatory Visit (INDEPENDENT_AMBULATORY_CARE_PROVIDER_SITE_OTHER): Payer: Medicaid Other | Admitting: Sports Medicine

## 2019-12-18 DIAGNOSIS — E1151 Type 2 diabetes mellitus with diabetic peripheral angiopathy without gangrene: Secondary | ICD-10-CM

## 2019-12-18 DIAGNOSIS — L97322 Non-pressure chronic ulcer of left ankle with fat layer exposed: Secondary | ICD-10-CM

## 2019-12-18 DIAGNOSIS — IMO0002 Reserved for concepts with insufficient information to code with codable children: Secondary | ICD-10-CM

## 2019-12-18 DIAGNOSIS — M25572 Pain in left ankle and joints of left foot: Secondary | ICD-10-CM

## 2019-12-18 DIAGNOSIS — M79671 Pain in right foot: Secondary | ICD-10-CM

## 2019-12-18 DIAGNOSIS — I739 Peripheral vascular disease, unspecified: Secondary | ICD-10-CM | POA: Diagnosis not present

## 2019-12-18 DIAGNOSIS — G894 Chronic pain syndrome: Secondary | ICD-10-CM

## 2019-12-18 DIAGNOSIS — M79672 Pain in left foot: Secondary | ICD-10-CM

## 2019-12-18 MED ORDER — OXYCODONE HCL 5 MG PO TABS: 5.0000 mg | ORAL_TABLET | Freq: Four times a day (QID) | ORAL | 0 refills | Status: AC | PRN

## 2019-12-18 NOTE — Progress Notes (Signed)
Subjective: Clarence Sims is a 59 y.o. male patient seen in office for evaluation of ulceration of the left ankle was referred by general surgeon Dr. Lovie Macadamia for ankle wound and reports that over the last 3 weeks it is gotten worse with more pain drainage redness swelling odor and warmth reports that he has been using some Neosporin and dry dressing with the help from wife to the area reports that pain to the medial ankle ulcer on the left is 10 out of 10.  Patient is diabetic last blood sugar was 500 last night however he reports his last A1c is 6.5 and reports that he saw his PCP Dr. Robina Ade 2 months ago.  Patient reports that he is currently on Bactrim for infection in the back or spine that he is chronically on and is under the care of Dr. Lovie Macadamia for a sacral ulcer.  Patient denies nausea vomiting fever chills or any other constitutional symptoms at this time except extreme pain. Patient has no other pedal complaints at this time.  Patient is assisted by wife at this visit.  Patient is in motorized wheelchair.  Review of systems no other acute contributory findings Patient Active Problem List   Diagnosis Date Noted  . Hypotension 05/26/2019  . Abscess of paraspinous muscles   . Adjustment disorder with mixed disturbance of emotions and conduct   . MRSA bacteremia 04/04/2019  . Polysubstance abuse ([place]) 04/04/2019  . Chronic pain syndrome 04/04/2019  . Chronic hepatitis C virus infection ([place]) 06/29/2016  . HAP (hospital-acquired pneumonia) 06/29/2016  . Pleural effusion 06/29/2016  . Antisocial personality disorder ([place]) 06/06/2016  . Paresis ([place]) 03/11/2015  . Acute encephalopathy 07/19/2014  . Pseudomonas urinary tract infection 07/19/2014  . Vertebral osteomyelitis ([place]) 07/13/2014  . Anxiety 05/13/2012  . Depression 05/13/2012  . Essential hypertension 05/13/2012  . History of prostate cancer 05/13/2012  . History of stroke 05/13/2012  . Hyperlipidemia 05/13/2012  . Type 2  diabetes mellitus ([place]) 05/13/2012  . Pressure ulcer of buttock 05/12/2012  . Hx of discitis 05/04/2012  . Spinal cord infarction ([place]) 05/01/2012  . 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 Neurogenic bladder 05/01/2012  . Sacral decubitus ulcer, stage IV ([place]) 05/01/2012  . Microcytic anemia 05/01/2012  . GERD (gastroesophageal reflux disease) 05/01/2012  . Chronic indwelling Foley catheter 05/01/2012  . Hereditary and idiopathic peripheral neuropathy 12/26/2011  . 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 albuterol (PROAIR HFA) 108 (90 Base) MCG/ACT inhaler Inhale 2 puffs into the lungs every 6 (six) hours as needed for wheezing or shortness of breath.    Marland Kitchen apixaban (ELIQUIS) 5 MG TABS tablet Take 5 mg by mouth 2 (two) times daily.    . Aspirin-Acetaminophen-Caffeine (GOODY HEADACHE PO) Take 1 packet by mouth 2 (two) times daily.    . baclofen (LIORESAL) 20 MG tablet Take 40 mg by mouth 3 (three) times daily.     . busPIRone (BUSPAR) 10 MG tablet Take 10 mg by mouth 3 (three) times daily.    . dapagliflozin propanediol (FARXIGA) 10 MG TABS tablet Take 10 mg by mouth every evening.     . docusate sodium (COLACE) 100 MG capsule Take 1 capsule (100 mg total) by mouth daily. (Patient not taking: Reported on 04/04/2019) 20 capsule 0  . doxycycline (MONODOX) 100 MG capsule Take 100 mg by mouth 2 (two) times daily.     Marland Kitchen  Ensure Plus (ENSURE PLUS) LIQD Take 237 mLs by mouth 2 (two) times daily. Strawberry at 10:00 and chocolate at 14:00 (Patient not taking: Reported on 04/04/2019) 60 Can 0  . esomeprazole (NEXIUM) 40 MG capsule Take 40 mg by mouth every morning.     . ferrous sulfate 325 (65 FE) MG tablet Take 1 tablet (325 mg total) by mouth daily with breakfast. (Patient not taking: Reported on 04/04/2019) 30 tablet 3  . FIBER ADULT GUMMIES PO Take 1 tablet by mouth every morning.    Marland Kitchen  FLUoxetine (PROZAC) 40 MG capsule Take 40 mg by mouth every morning.     . furosemide (LASIX) 20 MG tablet Take 20 mg by mouth every morning.     . insulin glargine (LANTUS) 100 UNIT/ML injection Inject 40 Units into the skin daily.    Marland Kitchen lactose free nutrition (BOOST) LIQD Take 237 mLs by mouth 2 (two) times daily between meals.    Marland Kitchen linaclotide (LINZESS) 145 MCG CAPS capsule Take 145 mcg by mouth at bedtime.     Marland Kitchen LORazepam (ATIVAN) 0.5 MG tablet Take 1 tablet (0.5 mg total) by mouth at bedtime. (Patient not taking: Reported on 04/04/2019) 15 tablet 0  . metFORMIN (GLUCOPHAGE) 1000 MG tablet Take 1,000 mg by mouth 2 (two) times daily.     . ondansetron (ZOFRAN) 4 MG tablet Take 1 tablet (4 mg total) by mouth every 4 (four) hours as needed. (Patient not taking: Reported on 04/04/2019) 20 tablet 0  . oxyCODONE-acetaminophen (PERCOCET/ROXICET) 5-325 MG tablet Take 1 tablet by mouth daily as needed for severe pain.    . pantoprazole (PROTONIX) 40 MG tablet Take 1 tablet (40 mg total) by mouth daily. (Patient not taking: Reported on 04/04/2019) 60 tablet 0  . pregabalin (LYRICA) 100 MG capsule Take 1 capsule (100 mg total) by mouth 3 (three) times daily. (Patient not taking: Reported on 04/04/2019) 30 capsule 0  . promethazine (PHENERGAN) 25 MG tablet Take 25 mg by mouth every 6 (six) hours as needed for nausea or vomiting.    . traZODone (DESYREL) 150 MG tablet Take 150 mg by mouth at bedtime.     No current facility-administered medications on file prior to visit.   Allergies  Allergen Reactions  . Novocain [Procaine] Other (See Comments)    Unknown reaction    No results found for this or any previous visit (from the past 2160 hour(s)).  Objective: There were no vitals filed for this visit.  General: Patient is awake, alert, oriented x 3 and in no acute distress.  Dermatology: Skin is warm and dry bilateral with a full thickness ulceration present left medial ankle. Ulceration measures 3.5 cm  x 5.5 cm x 0.4 cm. There is a rolled border with a granular base with a central fatty plug noted. The ulceration does not probe to bone. There is no malodor right now but patient does report at home his dog sniff the area and he smells things coming from the wound, clear active drainage, focal erythema, no edema.  Multiple abrasions on the entire lower leg from the upper third to the foot from patient picking at his leg.  No other acute signs of infection.   Vascular: Dorsalis Pedis pulse and posterior tibial pulse nonpalpable on the left however the limb does warm to touch with temperature gradient appearing to be normal warm to warm from proximal to distal with no increased warmth or size to suggest cellulitis or ischemia that is acute to the  limb or gangrene,  Capillary Fill Time  5 seconds.  Neurologic: Patient is severely hypersensitive even with light touch complaining of severe pain to the left leg.  Musculosketal: Pain to palpation to left lower leg/ulcerated area.  Patient is paraplegic and is wheelchair-confined.  Recent Labs    04/03/19 1530 04/03/19 1714 04/07/19 1424  GRAMSTAIN  --   --  ABUNDANT WBC PRESENT, PREDOMINANTLY PMN ABUNDANT GRAM POSITIVE COCCI   LABORGA METHICILLIN RESISTANT STAPHYLOCOCCUS AUREUS KLEBSIELLA PNEUMONIAE*  PROTEUS MIRABILIS* METHICILLIN RESISTANT STAPHYLOCOCCUS AUREUS    Assessment and Plan:  Problem List Items Addressed This Visit      Other   Chronic pain syndrome   Relevant Medications   oxyCODONE (ROXICODONE) 5 MG immediate release tablet    Other Visit Diagnoses    Ankle ulcer, left, with fat layer exposed ([place])    -  Primary   Relevant Orders   WOUND CULTURE   Acute left ankle pain       Relevant Medications   oxyCODONE (ROXICODONE) 5 MG immediate release tablet   PVD (peripheral vascular disease) ([facility])           -Examined patient and discussed the progression of the wound and treatment alternatives. -Cleanse ulceration to patient  tolerance at left medial ankle and wound culture was obtained will call patient if he needs additional antibiotics however he is currently on Bactrim for chronic history of infection at his spine -Applied Medihoney and dry sterile dressing and instructed patient to continue with daily dressings at home consisting of same with assistance from the wife -ABIs ordered -Dispense for this time only a few tablets of oxycodone to help patient until he can get in to see his pain management doctor on 5/17 due to the extreme pain in the limb - Advised patient to go to the ER or return to office if the wound worsens or if constitutional symptoms are present. -Patient to return to office in 2 weeks for follow up care and evaluation or sooner if problems arise.  At this visit we will attempt to see if we can get any x-rays of his foot and ankle however due to patient status and function of wheelchair this may be difficult to obtain, however will attempt.  Landis Martins, DPM

## 2019-12-19 ENCOUNTER — Telehealth: Payer: Self-pay | Admitting: *Deleted

## 2019-12-19 DIAGNOSIS — I739 Peripheral vascular disease, unspecified: Secondary | ICD-10-CM

## 2019-12-19 DIAGNOSIS — M25572 Pain in left ankle and joints of left foot: Secondary | ICD-10-CM

## 2019-12-19 DIAGNOSIS — L97322 Non-pressure chronic ulcer of left ankle with fat layer exposed: Secondary | ICD-10-CM

## 2019-12-19 NOTE — Telephone Encounter (Signed)
[employer] - Medicaid requires clinicals for review prior to pre-cert for [id number], Case: [id number]. Faxed clinicals, orders and demographics to [place].

## 2019-12-19 NOTE — Telephone Encounter (Signed)
-----   Message from Landis Martins, [place] sent at 12/18/2019 11:57 PM EDT ----- Regarding: ABIs Left ankle ulcer Nonpalpable pulses and severe pain

## 2019-12-23 ENCOUNTER — Other Ambulatory Visit: Payer: Self-pay | Admitting: Sports Medicine

## 2019-12-23 LAB — WOUND CULTURE

## 2019-12-23 MED ORDER — LEVOFLOXACIN 500 MG PO TABS
500.0000 mg | ORAL_TABLET | Freq: Every day | ORAL | 0 refills | Status: AC
Start: 2019-12-23 — End: ?

## 2019-12-23 NOTE — Telephone Encounter (Signed)
[employer] -MEDICAID [id number], EFFECTIVE:  12/19/2019, END:  06/16/2020 FOR ABI WITH TBI 93922, CASE:  [id number]. I spoke with [facility] scheduled pt for 01/01/2020 arrive 9:30am for 10:00am testing. Faxed orders to [facility]. I informed pt of the [facility] 01/01/2020 appt.

## 2019-12-23 NOTE — Progress Notes (Signed)
Levaquin sent for + wound culture if he is on any other antibiotics for his foot, stop those and start Levaquin -Dr. Cannon Kettle

## 2019-12-30 ENCOUNTER — Telehealth: Payer: Self-pay

## 2019-12-30 ENCOUNTER — Other Ambulatory Visit: Payer: Self-pay | Admitting: Sports Medicine

## 2019-12-30 ENCOUNTER — Telehealth: Payer: Self-pay | Admitting: *Deleted

## 2019-12-30 MED ORDER — MEDIHONEY WOUND/BURN DRESSING EX GEL: CUTANEOUS | 1 refills | Status: DC

## 2019-12-30 NOTE — Telephone Encounter (Signed)
-----   Message from Landis Martins, [place] sent at 12/23/2019 11:47 AM EDT ----- Levaquin sent for + wound culture if he is on any other antibiotics for his foot, stop those and start Levaquin -Dr. Cannon Kettle

## 2019-12-30 NOTE — Telephone Encounter (Signed)
Sent!

## 2019-12-30 NOTE — Telephone Encounter (Signed)
Pt called requesting a RF on medihoney sent to his pharmacy at [facility] in [place]. Pt confirmed he is still taking the abx (levaquin )

## 2019-12-30 NOTE — Telephone Encounter (Signed)
Pt would like a RF of medihoney sent to his pharmacy at [facility] in [place]

## 2019-12-30 NOTE — Progress Notes (Signed)
Sent Medihoney to pharmacy

## 2019-12-30 NOTE — Telephone Encounter (Signed)
[facility] [address] states they have medihoney paste can they substitute for the gel.

## 2019-12-30 NOTE — Telephone Encounter (Signed)
I spoke with [facility], informed the medihoney paste would be fine.

## 2020-01-01 ENCOUNTER — Other Ambulatory Visit: Payer: Self-pay | Admitting: Sports Medicine

## 2020-01-01 ENCOUNTER — Encounter: Payer: Self-pay | Admitting: Sports Medicine

## 2020-01-01 ENCOUNTER — Other Ambulatory Visit: Payer: Self-pay

## 2020-01-01 ENCOUNTER — Ambulatory Visit: Payer: Medicaid Other | Admitting: Sports Medicine

## 2020-01-01 ENCOUNTER — Ambulatory Visit: Payer: Self-pay

## 2020-01-01 DIAGNOSIS — E1151 Type 2 diabetes mellitus with diabetic peripheral angiopathy without gangrene: Secondary | ICD-10-CM

## 2020-01-01 DIAGNOSIS — L97321 Non-pressure chronic ulcer of left ankle limited to breakdown of skin: Secondary | ICD-10-CM

## 2020-01-01 DIAGNOSIS — G894 Chronic pain syndrome: Secondary | ICD-10-CM

## 2020-01-01 DIAGNOSIS — L97322 Non-pressure chronic ulcer of left ankle with fat layer exposed: Secondary | ICD-10-CM | POA: Diagnosis not present

## 2020-01-01 DIAGNOSIS — M25572 Pain in left ankle and joints of left foot: Secondary | ICD-10-CM

## 2020-01-01 DIAGNOSIS — I739 Peripheral vascular disease, unspecified: Secondary | ICD-10-CM | POA: Diagnosis not present

## 2020-01-01 DIAGNOSIS — IMO0002 Reserved for concepts with insufficient information to code with codable children: Secondary | ICD-10-CM

## 2020-01-01 MED ORDER — OXYCODONE HCL 5 MG PO TABS: 5.0000 mg | ORAL_TABLET | Freq: Four times a day (QID) | ORAL | 0 refills | Status: AC | PRN

## 2020-01-01 NOTE — Progress Notes (Signed)
Subjective: Clarence Sims is a 59 y.o. male patient seen in office for follow-up evaluation of ulceration of the left ankle.  Patient reports that the ankle ulcer is doing good but still has a significant amount of pain pain 9 out of 10 with shortness of breath after receiving Covid vaccination.  Patient denies nausea vomiting fever chills or any other constitutional symptoms at this time except extreme pain like before. Patient has no other pedal complaints at this time.  Patient is assisted by daughter at this visit.  Patient is in motorized wheelchair.  Review of systems no other acute contributory findings.  Patient Active Problem List   Diagnosis Date Noted  . Hypotension 05/26/2019  . Abscess of paraspinous muscles   . Adjustment disorder with mixed disturbance of emotions and conduct   . MRSA bacteremia 04/04/2019  . Polysubstance abuse ([place]) 04/04/2019  . Chronic pain syndrome 04/04/2019  . Chronic hepatitis C virus infection ([place]) 06/29/2016  . HAP (hospital-acquired pneumonia) 06/29/2016  . Pleural effusion 06/29/2016  . Antisocial personality disorder ([place]) 06/06/2016  . Paresis ([place]) 03/11/2015  . Acute encephalopathy 07/19/2014  . Pseudomonas urinary tract infection 07/19/2014  . Vertebral osteomyelitis ([place]) 07/13/2014  . Anxiety 05/13/2012  . Depression 05/13/2012  . Essential hypertension 05/13/2012  . History of prostate cancer 05/13/2012  . History of stroke 05/13/2012  . Hyperlipidemia 05/13/2012  . Type 2 diabetes mellitus ([place]) 05/13/2012  . Pressure ulcer of buttock 05/12/2012  . Hx of discitis 05/04/2012  . Spinal cord infarction ([place]) 05/01/2012  . 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 Neurogenic bladder 05/01/2012  . Sacral decubitus ulcer, stage IV ([place]) 05/01/2012  . Microcytic anemia 05/01/2012  . GERD (gastroesophageal reflux disease) 05/01/2012  . Chronic indwelling Foley catheter 05/01/2012  . Hereditary and idiopathic  peripheral neuropathy 12/26/2011  . 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 albuterol (PROAIR HFA) 108 (90 Base) MCG/ACT inhaler Inhale 2 puffs into the lungs every 6 (six) hours as needed for wheezing or shortness of breath.    Marland Kitchen apixaban (ELIQUIS) 5 MG TABS tablet Take 5 mg by mouth 2 (two) times daily.    . Aspirin-Acetaminophen-Caffeine (GOODY HEADACHE PO) Take 1 packet by mouth 2 (two) times daily.    . baclofen (LIORESAL) 20 MG tablet Take 40 mg by mouth 3 (three) times daily.     . busPIRone (BUSPAR) 10 MG tablet Take 10 mg by mouth 3 (three) times daily.    . ciprofloxacin (CIPRO) 500 MG tablet Take 500 mg by mouth 2 (two) times daily.    . dapagliflozin propanediol (FARXIGA) 10 MG TABS tablet Take 10 mg by mouth every evening.     . docusate sodium (COLACE) 100 MG capsule Take 1 capsule (100 mg total) by mouth daily. (Patient not taking: Reported on 04/04/2019) 20 capsule 0  . doxycycline (MONODOX) 100 MG capsule Take 100 mg by mouth 2 (two) times daily.     . Ensure Plus (ENSURE PLUS) LIQD Take 237 mLs by mouth 2 (two) times daily. Strawberry at 10:00 and chocolate at 14:00 (Patient not taking: Reported on 04/04/2019) 60 Can 0  . esomeprazole (NEXIUM) 40 MG capsule Take 40 mg by mouth every morning.     . ferrous sulfate 325 (65 FE) MG tablet Take 1 tablet (325 mg total) by mouth daily with breakfast. (Patient not taking: Reported on 04/04/2019) 30 tablet 3  .  FIBER ADULT GUMMIES PO Take 1 tablet by mouth every morning.    Marland Kitchen FLUoxetine (PROZAC) 40 MG capsule Take 40 mg by mouth every morning.     . furosemide (LASIX) 20 MG tablet Take 20 mg by mouth every morning.     . gabapentin (NEURONTIN) 800 MG tablet Take 800 mg by mouth 3 (three) times daily.    . insulin glargine (LANTUS) 100 UNIT/ML injection Inject 40 Units into the skin daily.    Marland Kitchen lactose free nutrition (BOOST) LIQD Take 237 mLs by mouth 2  (two) times daily between meals.    Marland Kitchen levofloxacin (LEVAQUIN) 500 MG tablet Take 1 tablet (500 mg total) by mouth daily. 7 tablet 0  . linaclotide (LINZESS) 145 MCG CAPS capsule Take 145 mcg by mouth at bedtime.     Marland Kitchen LORazepam (ATIVAN) 0.5 MG tablet Take 1 tablet (0.5 mg total) by mouth at bedtime. (Patient not taking: Reported on 04/04/2019) 15 tablet 0  . metFORMIN (GLUCOPHAGE) 1000 MG tablet Take 1,000 mg by mouth 2 (two) times daily.     . ondansetron (ZOFRAN) 4 MG tablet Take 1 tablet (4 mg total) by mouth every 4 (four) hours as needed. (Patient not taking: Reported on 04/04/2019) 20 tablet 0  . oxyCODONE-acetaminophen (PERCOCET/ROXICET) 5-325 MG tablet Take 1 tablet by mouth daily as needed for severe pain.    Marland Kitchen oxymorphone (OPANA ER) 40 MG 12 hr tablet Take by mouth.    . pantoprazole (PROTONIX) 40 MG tablet Take 1 tablet (40 mg total) by mouth daily. (Patient not taking: Reported on 04/04/2019) 60 tablet 0  . pregabalin (LYRICA) 100 MG capsule Take 1 capsule (100 mg total) by mouth 3 (three) times daily. (Patient not taking: Reported on 04/04/2019) 30 capsule 0  . promethazine (PHENERGAN) 25 MG tablet Take 25 mg by mouth every 6 (six) hours as needed for nausea or vomiting.    . traZODone (DESYREL) 150 MG tablet Take 150 mg by mouth at bedtime.    . Wound Dressings (MEDIHONEY WOUND/BURN DRESSING) GEL Apply small amount daily to wound 44 mL 1   No current facility-administered medications on file prior to visit.   Allergies  Allergen Reactions  . Novocain [Procaine] Other (See Comments)    Unknown reaction    Recent Results (from the past 2160 hour(s))  WOUND CULTURE     Status: Abnormal   Collection Time: 12/18/19  3:33 PM   Specimen: Foot, Left; Wound   WOUND CULTURE AND SENS  Result Value Ref Range   Gram Stain Result Final report    Organism ID, Bacteria Comment     Comment: No white blood cells seen.   Organism ID, Bacteria Comment     Comment: Moderate gram negative rods.    Organism ID, Bacteria Comment     Comment: Few gram positive cocci   Aerobic Bacterial Culture Final report (A)    Organism ID, Bacteria Morganella morganii (A)     Comment: Heavy growth   Organism ID, Bacteria Mixed skin flora     Comment: Heavy growth   Antimicrobial Susceptibility Comment     Comment:       ** S = Susceptible; I = Intermediate; R = Resistant **                    P = Positive; N = Negative             MICS are expressed in micrograms per mL  Antibiotic                 RSLT#1    RSLT#2    RSLT#3    RSLT#4 Amoxicillin/Clavulanic Acid    R Ampicillin                     R Cefazolin                      R Cefuroxime                     R Ciprofloxacin                  S Ertapenem                      S Gentamicin                     S Imipenem                       S Levofloxacin                   S Meropenem                      S Piperacillin/Tazobactam        S Tetracycline                   R Tobramycin                     S Trimethoprim/Sulfa             R     Objective: There were no vitals filed for this visit.  General: Patient is awake, alert, oriented x 3 and in no acute distress.  Dermatology: Skin is warm and dry bilateral with a full thickness ulceration present left medial ankle. Ulceration measures 2.5 cm x 4.5 cm x 0.4 cm. There is a rolled border with a granular base, The ulceration does not probe to bone.  Multiple abrasions on the entire lower leg from the upper third to the foot from patient picking at his leg like before.  No other acute signs of infection.   Vascular: Dorsalis Pedis pulse and posterior tibial pulse nonpalpable on the left however the limb does warm to touch with temperature gradient appearing to be normal warm to warm from proximal to distal with no increased warmth or size to suggest cellulitis or ischemia that is acute to the limb or gangrene,  Capillary Fill Time  5 seconds.  Neurologic: Patient is severely  hypersensitive even with light touch complaining of severe pain to the left leg.  Musculosketal: Pain to palpation to left lower leg/ulcerated area.  Patient is paraplegic and is wheelchair-confined.  Recent Labs    04/03/19 1530 04/03/19 1714 04/07/19 1424  GRAMSTAIN  --   --  ABUNDANT WBC PRESENT, PREDOMINANTLY PMN ABUNDANT GRAM POSITIVE COCCI   LABORGA METHICILLIN RESISTANT STAPHYLOCOCCUS AUREUS KLEBSIELLA PNEUMONIAE*  PROTEUS MIRABILIS* METHICILLIN RESISTANT STAPHYLOCOCCUS AUREUS    Assessment and Plan:  Problem List Items Addressed This Visit      Other   Chronic pain syndrome    Other Visit Diagnoses    Acute left ankle pain    -  Primary   Ankle ulcer, left, with fat layer exposed ([place])  PVD (peripheral vascular disease) ([place])       Uncontrolled diabetes mellitus type 2 with peripheral artery disease ([place])           -Examined patient and re-discussed the progression of the wound and treatment alternatives. -Xrays no acute osseous findings  -Cleanse ulceration to patient tolerance at left medial ankle and wound culture  -Applied Medihoney and dry sterile dressing and instructed patient to continue with daily dressings at home consisting of same with assistance from the wife or daughter -Awaiting ABIs -Dispensed again for this time only a few tablets of oxycodone to help patient until he can get in to see his pain management doctor on 6/11 due to the extreme pain in the limb - Advised patient to go to the ER or return to office if the wound worsens or if constitutional symptoms are present. -Patient to return to office in 3-4 weeks for follow up care and evaluation or sooner if problems arise.   Landis Martins, DPM

## 2020-01-13 ENCOUNTER — Encounter: Payer: Self-pay | Admitting: Sports Medicine

## 2020-01-19 ENCOUNTER — Encounter: Payer: Self-pay | Admitting: Sports Medicine

## 2020-01-23 ENCOUNTER — Encounter: Payer: Self-pay | Admitting: Sports Medicine

## 2020-01-29 ENCOUNTER — Ambulatory Visit (INDEPENDENT_AMBULATORY_CARE_PROVIDER_SITE_OTHER): Payer: Medicaid Other | Admitting: Sports Medicine

## 2020-01-29 ENCOUNTER — Other Ambulatory Visit: Payer: Self-pay

## 2020-01-29 ENCOUNTER — Encounter: Payer: Self-pay | Admitting: Sports Medicine

## 2020-01-29 DIAGNOSIS — IMO0002 Reserved for concepts with insufficient information to code with codable children: Secondary | ICD-10-CM

## 2020-01-29 DIAGNOSIS — I739 Peripheral vascular disease, unspecified: Secondary | ICD-10-CM

## 2020-01-29 DIAGNOSIS — G894 Chronic pain syndrome: Secondary | ICD-10-CM | POA: Diagnosis not present

## 2020-01-29 DIAGNOSIS — M25572 Pain in left ankle and joints of left foot: Secondary | ICD-10-CM | POA: Diagnosis not present

## 2020-01-29 DIAGNOSIS — L97322 Non-pressure chronic ulcer of left ankle with fat layer exposed: Secondary | ICD-10-CM

## 2020-01-29 DIAGNOSIS — E1165 Type 2 diabetes mellitus with hyperglycemia: Secondary | ICD-10-CM

## 2020-01-29 DIAGNOSIS — E1151 Type 2 diabetes mellitus with diabetic peripheral angiopathy without gangrene: Secondary | ICD-10-CM

## 2020-01-29 MED ORDER — OXYCODONE-ACETAMINOPHEN 10-325 MG PO TABS: 1.0000 | ORAL_TABLET | Freq: Three times a day (TID) | ORAL | 0 refills | Status: AC | PRN

## 2020-01-29 NOTE — Progress Notes (Signed)
Subjective: Clarence Sims is a 59 y.o. male patient seen in office for follow-up evaluation of ulceration of the left ankle.  Patient reports that the ankle ulcer is doing good but still has a significant amount of pain pain 9 out of 10 with shortness of breath after receiving Covid vaccination.  Patient denies nausea vomiting fever chills or any other constitutional symptoms at this time except extreme pain like before. Patient has no other pedal complaints at this time.  Patient is assisted by daughter at this visit.  Patient is in motorized wheelchair.  Review of systems no other acute contributory findings.  Patient Active Problem List   Diagnosis Date Noted  . Hypotension 05/26/2019  . Abscess of paraspinous muscles   . Adjustment disorder with mixed disturbance of emotions and conduct   . MRSA bacteremia 04/04/2019  . Polysubstance abuse ([place]) 04/04/2019  . Chronic pain syndrome 04/04/2019  . Chronic hepatitis C virus infection ([place]) 06/29/2016  . HAP (hospital-acquired pneumonia) 06/29/2016  . Pleural effusion 06/29/2016  . Antisocial personality disorder ([place]) 06/06/2016  . Paresis ([place]) 03/11/2015  . Acute encephalopathy 07/19/2014  . Pseudomonas urinary tract infection 07/19/2014  . Vertebral osteomyelitis ([place]) 07/13/2014  . Anxiety 05/13/2012  . Depression 05/13/2012  . Essential hypertension 05/13/2012  . History of prostate cancer 05/13/2012  . History of stroke 05/13/2012  . Hyperlipidemia 05/13/2012  . Type 2 diabetes mellitus ([place]) 05/13/2012  . Pressure ulcer of buttock 05/12/2012  . Hx of discitis 05/04/2012  . Spinal cord infarction ([place]) 05/01/2012  . 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 Neurogenic bladder 05/01/2012  . Sacral decubitus ulcer, stage IV ([place]) 05/01/2012  . Microcytic anemia 05/01/2012  . GERD (gastroesophageal reflux disease) 05/01/2012  . Chronic indwelling Foley catheter 05/01/2012  . Hereditary and idiopathic  peripheral neuropathy 12/26/2011  . 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 albuterol (PROAIR HFA) 108 (90 Base) MCG/ACT inhaler Inhale 2 puffs into the lungs every 6 (six) hours as needed for wheezing or shortness of breath.    Marland Kitchen apixaban (ELIQUIS) 5 MG TABS tablet Take 5 mg by mouth 2 (two) times daily.    . Aspirin-Acetaminophen-Caffeine (GOODY HEADACHE PO) Take 1 packet by mouth 2 (two) times daily.    . baclofen (LIORESAL) 20 MG tablet Take 40 mg by mouth 3 (three) times daily.     . busPIRone (BUSPAR) 10 MG tablet Take 10 mg by mouth 3 (three) times daily.    . ciprofloxacin (CIPRO) 500 MG tablet Take 500 mg by mouth 2 (two) times daily.    . dapagliflozin propanediol (FARXIGA) 10 MG TABS tablet Take 10 mg by mouth every evening.     . docusate sodium (COLACE) 100 MG capsule Take 1 capsule (100 mg total) by mouth daily. (Patient not taking: Reported on 04/04/2019) 20 capsule 0  . doxycycline (MONODOX) 100 MG capsule Take 100 mg by mouth 2 (two) times daily.     . Ensure Plus (ENSURE PLUS) LIQD Take 237 mLs by mouth 2 (two) times daily. Strawberry at 10:00 and chocolate at 14:00 (Patient not taking: Reported on 04/04/2019) 60 Can 0  . esomeprazole (NEXIUM) 40 MG capsule Take 40 mg by mouth every morning.     . ferrous sulfate 325 (65 FE) MG tablet Take 1 tablet (325 mg total) by mouth daily with breakfast. (Patient not taking: Reported on 04/04/2019) 30 tablet 3  .  FIBER ADULT GUMMIES PO Take 1 tablet by mouth every morning.    Marland Kitchen FLUoxetine (PROZAC) 40 MG capsule Take 40 mg by mouth every morning.     . furosemide (LASIX) 20 MG tablet Take 20 mg by mouth every morning.     . gabapentin (NEURONTIN) 800 MG tablet Take 800 mg by mouth 3 (three) times daily.    . insulin glargine (LANTUS) 100 UNIT/ML injection Inject 40 Units into the skin daily.    Marland Kitchen lactose free nutrition (BOOST) LIQD Take 237 mLs by mouth 2  (two) times daily between meals.    Marland Kitchen levofloxacin (LEVAQUIN) 500 MG tablet Take 1 tablet (500 mg total) by mouth daily. 7 tablet 0  . linaclotide (LINZESS) 145 MCG CAPS capsule Take 145 mcg by mouth at bedtime.     Marland Kitchen LORazepam (ATIVAN) 0.5 MG tablet Take 1 tablet (0.5 mg total) by mouth at bedtime. (Patient not taking: Reported on 04/04/2019) 15 tablet 0  . metFORMIN (GLUCOPHAGE) 1000 MG tablet Take 1,000 mg by mouth 2 (two) times daily.     . ondansetron (ZOFRAN) 4 MG tablet Take 1 tablet (4 mg total) by mouth every 4 (four) hours as needed. (Patient not taking: Reported on 04/04/2019) 20 tablet 0  . oxymorphone (OPANA ER) 40 MG 12 hr tablet Take by mouth.    . pantoprazole (PROTONIX) 40 MG tablet Take 1 tablet (40 mg total) by mouth daily. (Patient not taking: Reported on 04/04/2019) 60 tablet 0  . pregabalin (LYRICA) 100 MG capsule Take 1 capsule (100 mg total) by mouth 3 (three) times daily. (Patient not taking: Reported on 04/04/2019) 30 capsule 0  . promethazine (PHENERGAN) 25 MG tablet Take 25 mg by mouth every 6 (six) hours as needed for nausea or vomiting.    . traZODone (DESYREL) 150 MG tablet Take 150 mg by mouth at bedtime.    . Wound Dressings (MEDIHONEY WOUND/BURN DRESSING) GEL Apply small amount daily to wound 44 mL 1   No current facility-administered medications on file prior to visit.   Allergies  Allergen Reactions  . Novocain [Procaine] Other (See Comments)    Unknown reaction    Recent Results (from the past 2160 hour(s))  WOUND CULTURE     Status: Abnormal   Collection Time: 12/18/19  3:33 PM   Specimen: Foot, Left; Wound   WOUND CULTURE AND SENS  Result Value Ref Range   Gram Stain Result Final report    Organism ID, Bacteria Comment     Comment: No white blood cells seen.   Organism ID, Bacteria Comment     Comment: Moderate gram negative rods.   Organism ID, Bacteria Comment     Comment: Few gram positive cocci   Aerobic Bacterial Culture Final report (A)     Organism ID, Bacteria Morganella morganii (A)     Comment: Heavy growth   Organism ID, Bacteria Mixed skin flora     Comment: Heavy growth   Antimicrobial Susceptibility Comment     Comment:       ** S = Susceptible; I = Intermediate; R = Resistant **                    P = Positive; N = Negative             MICS are expressed in micrograms per mL    Antibiotic                 RSLT#1  RSLT#2    RSLT#3    RSLT#4 Amoxicillin/Clavulanic Acid    R Ampicillin                     R Cefazolin                      R Cefuroxime                     R Ciprofloxacin                  S Ertapenem                      S Gentamicin                     S Imipenem                       S Levofloxacin                   S Meropenem                      S Piperacillin/Tazobactam        S Tetracycline                   R Tobramycin                     S Trimethoprim/Sulfa             R     Objective: There were no vitals filed for this visit.  General: Patient is awake, alert, oriented x 3 and in no acute distress.  Dermatology: Skin is warm and dry bilateral with a full thickness ulceration present left medial ankle. Ulceration measures 1.5 cm x 4.0 cm x 0.4 cm. There is a rolled border with a granular base, The ulceration does not probe to bone.  Multiple abrasions on the entire lower leg from the upper third to the foot from patient picking at his leg like before that are dry scabs.  No other acute signs of infection.   Vascular: Dorsalis Pedis pulse and posterior tibial pulse nonpalpable on the left, CRT intact.  Neurologic: Patient is severely hypersensitive even with light touch complaining of severe pain to the left leg like before  Musculosketal: Pain to palpation to left lower leg/ulcerated area.  Patient is paraplegic and is wheelchair-confined.  Assessment and Plan:  Problem List Items Addressed This Visit      Other   Chronic pain syndrome   Relevant Medications    oxyCODONE-acetaminophen (PERCOCET) 10-325 MG tablet    Other Visit Diagnoses    Acute left ankle pain    -  Primary   Ankle ulcer, left, with fat layer exposed ([place])       PVD (peripheral vascular disease) ([place])       Uncontrolled diabetes mellitus type 2 with peripheral artery disease ([place])           -Examined patient and re-discussed the progression of the wound and treatment alternatives. -Cleanse ulceration to patient tolerance -Applied Medihoney and dry sterile dressing and instructed patient to continue with daily dressings at home consisting of same with assistance from the wife or daughter -ABIs normal -Dispensed again for this time only a few tablets of oxycodone to help patient until he can get in to  see his pain management doctor to take over his care which he has a follow up appt on 7/1 - Advised patient to go to the ER or return to office if the wound worsens or if constitutional symptoms are present. -Patient to return to office in 2-3 weeks for follow up care and evaluation or sooner if problems arise.   Landis Martins, DPM

## 2020-02-12 ENCOUNTER — Ambulatory Visit: Payer: Medicaid Other | Admitting: Sports Medicine

## 2020-02-13 ENCOUNTER — Telehealth: Payer: Self-pay | Admitting: Urology

## 2020-02-13 ENCOUNTER — Other Ambulatory Visit: Payer: Self-pay | Admitting: Sports Medicine

## 2020-02-13 MED ORDER — MEDIHONEY WOUND/BURN DRESSING EX GEL: CUTANEOUS | 1 refills | Status: DC

## 2020-02-13 NOTE — Telephone Encounter (Signed)
Pt needs a refill on Med Honey and would like it sent to [facility]  in [place]. Thanks!

## 2020-02-13 NOTE — Telephone Encounter (Signed)
Sent!

## 2020-02-13 NOTE — Progress Notes (Signed)
Medihoney refilled

## 2020-02-23 ENCOUNTER — Ambulatory Visit (INDEPENDENT_AMBULATORY_CARE_PROVIDER_SITE_OTHER): Payer: Medicaid Other | Admitting: Podiatry

## 2020-02-23 DIAGNOSIS — Z5329 Procedure and treatment not carried out because of patient's decision for other reasons: Secondary | ICD-10-CM

## 2020-02-23 NOTE — Progress Notes (Signed)
No show for appt. 

## 2020-02-27 ENCOUNTER — Ambulatory Visit: Payer: Medicaid Other | Admitting: Sports Medicine

## 2020-03-10 ENCOUNTER — Ambulatory Visit: Payer: Medicaid Other | Admitting: Sports Medicine

## 2020-03-11 ENCOUNTER — Ambulatory Visit (INDEPENDENT_AMBULATORY_CARE_PROVIDER_SITE_OTHER): Payer: Medicaid Other | Admitting: Sports Medicine

## 2020-03-11 ENCOUNTER — Other Ambulatory Visit: Payer: Self-pay

## 2020-03-11 ENCOUNTER — Encounter: Payer: Self-pay | Admitting: Sports Medicine

## 2020-03-11 DIAGNOSIS — M25572 Pain in left ankle and joints of left foot: Secondary | ICD-10-CM

## 2020-03-11 DIAGNOSIS — E1151 Type 2 diabetes mellitus with diabetic peripheral angiopathy without gangrene: Secondary | ICD-10-CM

## 2020-03-11 DIAGNOSIS — IMO0002 Reserved for concepts with insufficient information to code with codable children: Secondary | ICD-10-CM

## 2020-03-11 DIAGNOSIS — I739 Peripheral vascular disease, unspecified: Secondary | ICD-10-CM

## 2020-03-11 DIAGNOSIS — G894 Chronic pain syndrome: Secondary | ICD-10-CM

## 2020-03-11 DIAGNOSIS — L97322 Non-pressure chronic ulcer of left ankle with fat layer exposed: Secondary | ICD-10-CM | POA: Diagnosis not present

## 2020-03-11 MED ORDER — OXYCODONE-ACETAMINOPHEN 10-325 MG PO TABS: 1.0000 | ORAL_TABLET | Freq: Three times a day (TID) | ORAL | 0 refills | Status: AC | PRN

## 2020-03-11 NOTE — Progress Notes (Signed)
Subjective: Clarence Sims is a 59 y.o. male patient seen in office for follow-up evaluation of ulceration of the left ankle.  Patient reports that he missed previous visit because he was in the hospital.  Patient reports he did not get to go to pain management because he was in the hospital.  Patient reports that he bumped his leg against the wall and made it bleed and it seems like it got bigger with some redness and had an episode of nausea and vomiting for the last 2 weeks but no fever or chills.  Patient is assisted by aide who reports that she is helping with change in the bandage using Medihoney reports that the Medihoney was initially working but now it looks about the same.  Patient reports he has been taking ibuprofen but the pain is severe at the ankle feels like it goes to bone.  Fasting blood sugar 192.  Patient is in motorized wheelchair.   Patient Active Problem List   Diagnosis Date Noted   Hypotension 05/26/2019   Abscess of paraspinous muscles    Adjustment disorder with mixed disturbance of emotions and conduct    MRSA bacteremia 04/04/2019   Polysubstance abuse ([place]) 04/04/2019   Chronic pain syndrome 04/04/2019   Chronic hepatitis C virus infection ([place]) 06/29/2016   HAP (hospital-acquired pneumonia) 06/29/2016   Pleural effusion 06/29/2016   Antisocial personality disorder ([place]) 06/06/2016   Paresis ([place]) 03/11/2015   Acute encephalopathy 07/19/2014   Pseudomonas urinary tract infection 07/19/2014   Vertebral osteomyelitis ([place]) 07/13/2014   Anxiety 05/13/2012   Depression 05/13/2012   Essential hypertension 05/13/2012   History of prostate cancer 05/13/2012   History of stroke 05/13/2012   Hyperlipidemia 05/13/2012   Type 2 diabetes mellitus ([place]) 05/13/2012   Pressure ulcer of buttock 05/12/2012   Hx of discitis 05/04/2012   Spinal cord infarction ([place]) 05/01/2012   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     apixaban (ELIQUIS) 5 MG TABS tablet Take 5 mg by mouth 2 (two) times daily.     Aspirin-Acetaminophen-Caffeine (GOODY HEADACHE PO) Take 1 packet by mouth 2 (two) times daily.     baclofen (LIORESAL) 20 MG tablet Take 40 mg by mouth 3 (three) times daily.      busPIRone (BUSPAR) 10 MG tablet Take 10 mg by mouth 3 (three) times daily.     ciprofloxacin (CIPRO) 500 MG tablet Take 500 mg by mouth 2 (two) times daily.     dapagliflozin propanediol (FARXIGA) 10 MG TABS tablet Take 10 mg by mouth every evening.      docusate sodium (COLACE) 100 MG capsule Take 1 capsule (100 mg total) by mouth daily. (Patient not taking: Reported on 04/04/2019) 20 capsule 0   doxycycline (MONODOX) 100 MG capsule Take 100 mg by mouth 2 (two) times daily.      Ensure Plus (ENSURE PLUS) LIQD Take 237 mLs by mouth 2 (two) times daily. Strawberry at 10:00 and chocolate  at 14:00 (Patient not taking: Reported on 04/04/2019) 60 Can 0   esomeprazole (NEXIUM) 40 MG capsule Take 40 mg by mouth every morning.      ferrous sulfate 325 (65 FE) MG tablet Take 1 tablet (325 mg total) by mouth daily with breakfast. (Patient not taking: Reported on 04/04/2019) 30 tablet 3   FIBER ADULT GUMMIES PO Take 1 tablet by mouth every morning.     FLUoxetine (PROZAC) 40 MG capsule Take 40 mg by mouth every morning.      furosemide (LASIX) 20 MG tablet Take 20 mg by mouth every  morning.      gabapentin (NEURONTIN) 800 MG tablet Take 800 mg by mouth 3 (three) times daily.     insulin glargine (LANTUS) 100 UNIT/ML injection Inject 40 Units into the skin daily.     lactose free nutrition (BOOST) LIQD Take 237 mLs by mouth 2 (two) times daily between meals.     levofloxacin (LEVAQUIN) 500 MG tablet Take 1 tablet (500 mg total) by mouth daily. 7 tablet 0   linaclotide (LINZESS) 145 MCG CAPS capsule Take 145 mcg by mouth at bedtime.      LORazepam (ATIVAN) 0.5 MG tablet Take 1 tablet (0.5 mg total) by mouth at bedtime. (Patient not taking: Reported on 04/04/2019) 15 tablet 0   metFORMIN (GLUCOPHAGE) 1000 MG tablet Take 1,000 mg by mouth 2 (two) times daily.      ondansetron (ZOFRAN) 4 MG tablet Take 1 tablet (4 mg total) by mouth every 4 (four) hours as needed. (Patient not taking: Reported on 04/04/2019) 20 tablet 0   oxymorphone (OPANA ER) 40 MG 12 hr tablet Take by mouth.     pantoprazole (PROTONIX) 40 MG tablet Take 1 tablet (40 mg total) by mouth daily. (Patient not taking: Reported on 04/04/2019) 60 tablet 0   pregabalin (LYRICA) 100 MG capsule Take 1 capsule (100 mg total) by mouth 3 (three) times daily. (Patient not taking: Reported on 04/04/2019) 30 capsule 0   promethazine (PHENERGAN) 25 MG tablet Take 25 mg by mouth every 6 (six) hours as needed for nausea or vomiting.     traZODone (DESYREL) 150 MG tablet Take 150 mg by mouth at bedtime.     Wound Dressings (MEDIHONEY WOUND/BURN DRESSING) GEL Apply small amount daily to wound 44 mL 1   No current facility-administered medications on file prior to visit.   Allergies  Allergen Reactions   Novocain [Procaine] Other (See Comments)    Unknown reaction    Recent Results (from the past 2160 hour(s))  WOUND CULTURE     Status: Abnormal   Collection Time: 12/18/19  3:33 PM   Specimen: Foot, Left; Wound   WOUND CULTURE AND SENS  Result Value Ref Range   Gram Stain Result Final report    Organism ID,  Bacteria Comment     Comment: No white blood cells seen.   Organism ID, Bacteria Comment     Comment: Moderate gram negative rods.   Organism ID, Bacteria Comment     Comment: Few gram positive cocci   Aerobic Bacterial Culture Final report (A)    Organism ID, Bacteria Morganella morganii (A)     Comment: Heavy growth   Organism ID, Bacteria Mixed skin flora     Comment: Heavy growth   Antimicrobial Susceptibility Comment     Comment:       ** S = Susceptible; I = Intermediate; R = Resistant **  P = Positive; N = Negative             MICS are expressed in micrograms per mL    Antibiotic                 RSLT#1    RSLT#2    RSLT#3    RSLT#4 Amoxicillin/Clavulanic Acid    R Ampicillin                     R Cefazolin                      R Cefuroxime                     R Ciprofloxacin                  S Ertapenem                      S Gentamicin                     S Imipenem                       S Levofloxacin                   S Meropenem                      S Piperacillin/Tazobactam        S Tetracycline                   R Tobramycin                     S Trimethoprim/Sulfa             R     Objective: There were no vitals filed for this visit.  General: Patient is awake, alert, oriented x 3 and in no acute distress.  Dermatology: Skin is warm and dry bilateral with a full thickness ulceration present left medial ankle. Ulceration measures 4.0 cm x 6.0 cm x 0.2 cm, larger than previous. There is a rolled border with a granular base, The ulceration does not probe to bone.  Multiple abrasions on the entire lower leg from the upper third to the foot from patient picking at his leg like before that are dry scabs or bumping his leg.  No other acute signs of infection.   Vascular: Dorsalis Pedis pulse and posterior tibial pulse nonpalpable on the left, CRT intact.  ABIs from May are normal.  Neurologic: Patient is severely hypersensitive even with light touch  complaining of severe pain to the left leg like before worse at the medial ankle expressed pain down to the bone.  Musculosketal: Pain to palpation to left lower leg/ulcerated area.  Patient is paraplegic and is wheelchair-confined.  Assessment and Plan:  Problem List Items Addressed This Visit      Other   Chronic pain syndrome    Other Visit Diagnoses    Ankle ulcer, left, with fat layer exposed ([place])    -  Primary   Acute left ankle pain       PVD (peripheral vascular disease) ([place])       Uncontrolled diabetes mellitus type 2 with peripheral artery disease ([place])           -Examined patient and  re-discussed the progression of the wound and treatment alternatives. -Cleanse ulceration to patient tolerance and use a saline moistened gauze to nonselectively debride the ulceration at the left medial ankle to healthy bleeding margins.  Patient tolerated the debridement procedure well with the gauze without need for anesthesia.  Minimal blood loss noted.  Hemostasis was achieved with manual pressure. -Applied Prisma to the medial ankle and Medihoney to small scabs and dry sterile dressing and instructed patient to continue with daily dressings at home consisting of same with assistance from the wife or daughter or aide - Advised patient to go to the ER or return to office if the wound worsens or if constitutional symptoms are present. -May consider EPIFIX next visit if covered by his insurance since wound has enlarged since last visit -Refilled pain medicine this time only since patient still has to get rescheduled for pain management which he says he has an appointment later in August for -No additional antibiotics needed at this time since clinically there is no acute concerns for any gross infection -Patient to return to office in 2-3 weeks for follow up care and evaluation or sooner if problems arise.   Landis Martins, DPM

## 2020-03-12 ENCOUNTER — Telehealth: Payer: Self-pay | Admitting: *Deleted

## 2020-03-12 NOTE — Telephone Encounter (Signed)
-----   Message from Landis Martins, [place] sent at 03/11/2020 10:14 PM EDT ----- Regarding: IVR for Epifix For application if approved at next office visit

## 2020-03-12 NOTE — Telephone Encounter (Signed)
First fax did not go through [phone], faxed through [phone].

## 2020-03-12 NOTE — Telephone Encounter (Signed)
Faxed required form, clinicals and demographics to [place], and emailed J. Linden Dolin.

## 2020-03-26 ENCOUNTER — Ambulatory Visit: Payer: Medicaid Other | Admitting: Sports Medicine

## 2020-03-30 ENCOUNTER — Telehealth: Payer: Self-pay

## 2020-03-30 NOTE — Telephone Encounter (Signed)
error 

## 2020-04-02 ENCOUNTER — Ambulatory Visit: Payer: Medicaid Other | Admitting: Sports Medicine

## 2020-04-06 ENCOUNTER — Ambulatory Visit: Payer: Medicaid Other | Admitting: Sports Medicine

## 2020-04-06 ENCOUNTER — Other Ambulatory Visit: Payer: Self-pay | Admitting: Sports Medicine

## 2020-04-06 ENCOUNTER — Telehealth: Payer: Self-pay | Admitting: Sports Medicine

## 2020-04-06 MED ORDER — "KENDALL ALGINATE DRESS 4""X4"" EX PADS": MEDICATED_PAD | CUTANEOUS | 1 refills | Status: DC

## 2020-04-06 MED ORDER — MEDIHONEY WOUND/BURN DRESSING EX GEL: CUTANEOUS | 1 refills | Status: AC

## 2020-04-06 MED ORDER — MEDIHONEY WOUND/BURN DRESSING EX GEL: CUTANEOUS | 1 refills | Status: DC

## 2020-04-06 MED ORDER — OXYCODONE-ACETAMINOPHEN 10-325 MG PO TABS: 1.0000 | ORAL_TABLET | Freq: Three times a day (TID) | ORAL | 0 refills | Status: DC | PRN

## 2020-04-06 MED ORDER — OXYCODONE-ACETAMINOPHEN 10-325 MG PO TABS: 1.0000 | ORAL_TABLET | Freq: Three times a day (TID) | ORAL | 0 refills | Status: AC | PRN

## 2020-04-06 NOTE — Progress Notes (Signed)
Change Rx to [facility]

## 2020-04-06 NOTE — Telephone Encounter (Signed)
Done

## 2020-04-06 NOTE — Telephone Encounter (Signed)
Requesting pain medication refill

## 2020-04-06 NOTE — Telephone Encounter (Signed)
PT was informed of pain RF sent to his pharmacy

## 2020-04-06 NOTE — Progress Notes (Signed)
Refilled pain medication for this last time and advised patient that he must follow up with pain management and keep all future appointments in order to qualify for refills  Rx Medihone and Alginate pads for wound care  -Dr. Chauncey Cruel

## 2020-04-07 ENCOUNTER — Telehealth: Payer: Self-pay

## 2020-04-07 NOTE — Telephone Encounter (Signed)
Yes he can have the paste instead

## 2020-04-07 NOTE — Telephone Encounter (Signed)
Pharmacist from [facility] in [place] called and LVM stating pt was rx medihoney but it's out of stock. pharmacist would like to know if they can give patient the paste instead

## 2020-04-07 NOTE — Telephone Encounter (Signed)
Pharmacy was informed that per Dr. Cannon Kettle it is fine to change medihoney gel to paste

## 2020-04-08 ENCOUNTER — Telehealth: Payer: Self-pay

## 2020-04-08 NOTE — Telephone Encounter (Signed)
Use antibiotic cream until he can be seen again in office

## 2020-04-08 NOTE — Telephone Encounter (Signed)
Pt called and LVM stating they can not get hold of the patches at any store  Pt would like to know what else to use or apply until his next office visit.

## 2020-04-09 ENCOUNTER — Ambulatory Visit: Payer: Medicaid Other | Admitting: Sports Medicine

## 2020-04-09 NOTE — Telephone Encounter (Signed)
Pt was informed of Dr. Leeanne Rio instruction to start using abx cream until his next visit

## 2020-04-14 ENCOUNTER — Ambulatory Visit: Payer: Medicaid Other | Admitting: Sports Medicine

## 2020-04-23 ENCOUNTER — Ambulatory Visit: Payer: Medicaid Other | Admitting: Sports Medicine

## 2020-04-28 ENCOUNTER — Ambulatory Visit: Payer: Medicaid Other | Admitting: Sports Medicine

## 2020-05-05 ENCOUNTER — Ambulatory Visit: Payer: Medicaid Other | Admitting: Sports Medicine

## 2020-05-07 ENCOUNTER — Other Ambulatory Visit: Payer: Self-pay | Admitting: Sports Medicine

## 2020-05-07 ENCOUNTER — Telehealth: Payer: Self-pay | Admitting: Sports Medicine

## 2020-05-07 MED ORDER — "KENDALL ALGINATE DRESS 4""X4"" EX PADS": MEDICATED_PAD | CUTANEOUS | 1 refills | Status: AC

## 2020-05-07 MED ORDER — OXYCODONE-ACETAMINOPHEN 5-325 MG PO TABS
1.0000 | ORAL_TABLET | Freq: Three times a day (TID) | ORAL | 0 refills | Status: DC | PRN
Start: 2020-05-07 — End: 2020-05-12

## 2020-05-07 NOTE — Progress Notes (Signed)
Refilled wound care dressing/alginate and pain medication

## 2020-05-07 NOTE — Telephone Encounter (Signed)
Refill sent.

## 2020-05-07 NOTE — Telephone Encounter (Signed)
Pt is requesting refill for a patch that dissolves into wound-states was given one in office & they need another one & would like a few extras to treat other wounds with.  Also would like to know if you can refill pain meds to get him thru until pain mgt appt on 05-14-2020.  [facility]

## 2020-05-11 ENCOUNTER — Ambulatory Visit: Payer: Medicaid Other | Admitting: Sports Medicine

## 2020-05-12 ENCOUNTER — Encounter: Payer: Self-pay | Admitting: Sports Medicine

## 2020-05-12 ENCOUNTER — Other Ambulatory Visit: Payer: Self-pay

## 2020-05-12 ENCOUNTER — Ambulatory Visit (INDEPENDENT_AMBULATORY_CARE_PROVIDER_SITE_OTHER): Payer: Medicaid Other | Admitting: Sports Medicine

## 2020-05-12 DIAGNOSIS — L97511 Non-pressure chronic ulcer of other part of right foot limited to breakdown of skin: Secondary | ICD-10-CM

## 2020-05-12 DIAGNOSIS — M25572 Pain in left ankle and joints of left foot: Secondary | ICD-10-CM

## 2020-05-12 DIAGNOSIS — G894 Chronic pain syndrome: Secondary | ICD-10-CM

## 2020-05-12 DIAGNOSIS — I739 Peripheral vascular disease, unspecified: Secondary | ICD-10-CM

## 2020-05-12 DIAGNOSIS — IMO0002 Reserved for concepts with insufficient information to code with codable children: Secondary | ICD-10-CM

## 2020-05-12 DIAGNOSIS — L97322 Non-pressure chronic ulcer of left ankle with fat layer exposed: Secondary | ICD-10-CM

## 2020-05-12 MED ORDER — OXYCODONE-ACETAMINOPHEN 5-325 MG PO TABS
1.0000 | ORAL_TABLET | Freq: Three times a day (TID) | ORAL | 0 refills | Status: AC | PRN
Start: 2020-05-12 — End: 2020-05-17

## 2020-05-12 NOTE — Progress Notes (Signed)
Subjective: Clarence Sims is a 59 y.o. male patient seen in office for follow-up evaluation of ulceration of the left ankle.  Patient is assisted by caregiver who reports that he has new wounds now on the right foot as well. Reports that he is dealing with a lot of pain and issues with sacral ulcers as well. Patient reports he will be following up with pain management on next week and is wondering if I can a few tablets to help with his pain..  Fasting blood sugar not recorded.  Patient is in wheelchair.   Patient Active Problem List   Diagnosis Date Noted  . Hypotension 05/26/2019  . Abscess of paraspinous muscles   . Adjustment disorder with mixed disturbance of emotions and conduct   . MRSA bacteremia 04/04/2019  . Polysubstance abuse ([place]) 04/04/2019  . Chronic pain syndrome 04/04/2019  . Chronic hepatitis C virus infection ([place]) 06/29/2016  . HAP (hospital-acquired pneumonia) 06/29/2016  . Pleural effusion 06/29/2016  . Antisocial personality disorder ([place]) 06/06/2016  . Paresis ([place]) 03/11/2015  . Acute encephalopathy 07/19/2014  . Pseudomonas urinary tract infection 07/19/2014  . Vertebral osteomyelitis ([place]) 07/13/2014  . Anxiety 05/13/2012  . Depression 05/13/2012  . Essential hypertension 05/13/2012  . History of prostate cancer 05/13/2012  . History of stroke 05/13/2012  . Hyperlipidemia 05/13/2012  . Type 2 diabetes mellitus ([place]) 05/13/2012  . Pressure ulcer of buttock 05/12/2012  . Hx of discitis 05/04/2012  . Spinal cord infarction ([place]) 05/01/2012  . 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 Neurogenic bladder 05/01/2012  . Sacral decubitus ulcer, stage IV ([place]) 05/01/2012  . Microcytic anemia 05/01/2012  . GERD (gastroesophageal reflux disease) 05/01/2012  . Chronic indwelling Foley catheter 05/01/2012  . Hereditary and idiopathic peripheral neuropathy 12/26/2011  . 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 albuterol (PROAIR HFA) 108 (90 Base) MCG/ACT inhaler Inhale 2 puffs into the lungs every 6 (six) hours as needed for wheezing or shortness of breath.    Marland Kitchen apixaban (ELIQUIS) 5 MG TABS tablet Take 5 mg by mouth 2 (two) times daily.    . Aspirin-Acetaminophen-Caffeine (GOODY HEADACHE PO) Take 1 packet by mouth 2 (two) times daily.    . baclofen (LIORESAL) 20 MG tablet Take 40 mg by mouth 3 (three) times daily.     . busPIRone (BUSPAR) 10 MG tablet Take 10 mg by mouth 3 (three) times daily.    . ciprofloxacin (CIPRO) 500 MG tablet Take 500 mg by mouth 2 (two) times daily.    . dapagliflozin propanediol (FARXIGA) 10 MG TABS tablet Take 10 mg by mouth every evening.     . docusate sodium (COLACE) 100 MG capsule Take 1 capsule (100 mg total) by mouth daily. (Patient not taking: Reported on 04/04/2019) 20 capsule 0  . doxycycline (MONODOX) 100 MG capsule Take 100 mg by mouth 2 (two) times daily.     . Ensure Plus (ENSURE PLUS) LIQD Take 237 mLs by mouth 2 (two) times daily. Strawberry at 10:00 and chocolate at 14:00 (Patient not taking: Reported on 04/04/2019) 60 Can 0  . esomeprazole (NEXIUM) 40 MG capsule Take 40 mg by mouth every morning.     . ferrous sulfate 325 (65 FE) MG tablet Take 1 tablet (325 mg total) by mouth daily with breakfast. (Patient not taking: Reported on 04/04/2019) 30 tablet 3  . FIBER ADULT GUMMIES PO Take 1 tablet  by mouth every morning.    Marland Kitchen FLUoxetine (PROZAC) 40 MG capsule Take 40 mg by mouth every morning.     . furosemide (LASIX) 20 MG tablet Take 20 mg by mouth every morning.     . gabapentin (NEURONTIN) 800 MG tablet Take 800 mg by mouth 3 (three) times daily.    . Hydroactive Dressings (KENDALL ALGINATE DRESS 4"X4") PADS Apply a small piece to the wound at ankle/leg every other day as needed 5 each 1  . insulin glargine (LANTUS) 100 UNIT/ML injection Inject 40 Units into the skin daily.    Marland Kitchen lactose free nutrition  (BOOST) LIQD Take 237 mLs by mouth 2 (two) times daily between meals.    Marland Kitchen levofloxacin (LEVAQUIN) 500 MG tablet Take 1 tablet (500 mg total) by mouth daily. 7 tablet 0  . linaclotide (LINZESS) 145 MCG CAPS capsule Take 145 mcg by mouth at bedtime.     Marland Kitchen LORazepam (ATIVAN) 0.5 MG tablet Take 1 tablet (0.5 mg total) by mouth at bedtime. (Patient not taking: Reported on 04/04/2019) 15 tablet 0  . metFORMIN (GLUCOPHAGE) 1000 MG tablet Take 1,000 mg by mouth 2 (two) times daily.     . ondansetron (ZOFRAN) 4 MG tablet Take 1 tablet (4 mg total) by mouth every 4 (four) hours as needed. (Patient not taking: Reported on 04/04/2019) 20 tablet 0  . oxymorphone (OPANA ER) 40 MG 12 hr tablet Take by mouth.    . pantoprazole (PROTONIX) 40 MG tablet Take 1 tablet (40 mg total) by mouth daily. (Patient not taking: Reported on 04/04/2019) 60 tablet 0  . pregabalin (LYRICA) 100 MG capsule Take 1 capsule (100 mg total) by mouth 3 (three) times daily. (Patient not taking: Reported on 04/04/2019) 30 capsule 0  . promethazine (PHENERGAN) 25 MG tablet Take 25 mg by mouth every 6 (six) hours as needed for nausea or vomiting.    . traZODone (DESYREL) 150 MG tablet Take 150 mg by mouth at bedtime.    . Wound Dressings (MEDIHONEY WOUND/BURN DRESSING) GEL Apply small amount daily to wound 44 mL 1   No current facility-administered medications on file prior to visit.   Allergies  Allergen Reactions  . Novocain [Procaine] Other (See Comments)    Unknown reaction    No results found for this or any previous visit (from the past 2160 hour(s)).  Objective: There were no vitals filed for this visit.  General: Patient is awake, alert, oriented x 3 and in no acute distress.  Dermatology: Skin is warm and dry bilateral with a full thickness ulceration present  1.Left medial ankle, Ulceration measures 3 cm x 4.0 cm x 0.2 cm, smaller than previous. There is a rolled border with a granular base, The ulceration does not probe to  bone.  Multiple abrasions on the entire lower leg from the upper third to the foot from patient picking at his leg like before that are dry scabs or bumping his leg.  No other acute signs of infection. 2. Right medial ankle, Ulceration measures 1 cm x 1 cm x 0.2 cm. There is a rolled border with a granular base, The ulceration does not probe to bone. No malodor.  3. Right medial 1st MTPJ and plantar 5th MRPJ Ulcerations measure 0.5cm x 0.3 cm x 0.2 cm. There is a rolled border with a granular base, The ulceration does not probe to bone. No malodor, no drainage, no acute signs of infection.    Vascular: Dorsalis Pedis pulse and posterior  tibial pulse nonpalpable on the left, CRT intact.  ABIs from May are normal as previously noted.   Neurologic: Patient is severely hypersensitive even with light touch complaining of severe pain to the left leg like before worse at the medial ankle with random uncontrolled flinching  Musculosketal: Pain to palpation to left>right lower leg/ulcerated area.  Patient is paraplegic and is wheelchair-confined.  Assessment and Plan:  Problem List Items Addressed This Visit      Other   Chronic pain syndrome   Relevant Medications   oxyCODONE-acetaminophen (PERCOCET/ROXICET) 5-325 MG tablet    Other Visit Diagnoses    Ankle ulcer, left, with fat layer exposed ([place])    -  Primary   Acute left ankle pain       Right foot ulcer, limited to breakdown of skin ([place])       PVD (peripheral vascular disease) ([place])       Uncontrolled diabetes mellitus type 2 with peripheral artery disease ([place])           -Examined patient and re-discussed the progression of the wound and treatment alternatives. -Cleansed ulceration to patient tolerance and use a saline moistened gauze to nonselectively debride the ulceration at the left medial ankle and right foot x3to healthy bleeding margins.  Patient tolerated the debridement procedure well with the gauze without need for anesthesia.   Minimal blood loss noted.  Hemostasis was achieved with manual pressure. -Applied Prisma to the medial ankles and Medihoney to other wounds and dry sterile dressing and instructed patient to continue with daily dressings at home consisting of same with assistance from the aide -Referral made to [facility] wound care center for sacral and lower extremities, appt confirmed for 10/5 at 1pm - Advised patient to go to the ER or return to office if the wound worsens or if constitutional symptoms are present. -Refilled pain medicine this time only since patient still has pain and will see pain management next week  -Patient to return to office as needed or sooner if problems arise.   Landis Martins, DPM

## 2020-05-13 ENCOUNTER — Telehealth: Payer: Self-pay | Admitting: Sports Medicine

## 2020-05-13 NOTE — Telephone Encounter (Signed)
Pts wife notified of appt

## 2020-05-13 NOTE — Telephone Encounter (Signed)
-----   Message from Landis Martins, [place] sent at 05/12/2020  4:35 PM EDT ----- Regarding: Wound care center appt in [facility] Please let patient know that his appointment for the wound center is Oct 5th at [facility] [address]  [place], [place] [address]

## 2020-05-31 ENCOUNTER — Encounter (HOSPITAL_BASED_OUTPATIENT_CLINIC_OR_DEPARTMENT_OTHER): Payer: Medicaid Other | Attending: Internal Medicine | Admitting: Internal Medicine

## 2020-06-11 ENCOUNTER — Other Ambulatory Visit: Payer: Self-pay | Admitting: Sports Medicine

## 2020-06-11 DIAGNOSIS — I739 Peripheral vascular disease, unspecified: Secondary | ICD-10-CM

## 2020-06-11 DIAGNOSIS — IMO0002 Reserved for concepts with insufficient information to code with codable children: Secondary | ICD-10-CM

## 2020-06-11 DIAGNOSIS — L97322 Non-pressure chronic ulcer of left ankle with fat layer exposed: Secondary | ICD-10-CM

## 2020-06-11 DIAGNOSIS — G894 Chronic pain syndrome: Secondary | ICD-10-CM

## 2020-06-11 DIAGNOSIS — L97511 Non-pressure chronic ulcer of other part of right foot limited to breakdown of skin: Secondary | ICD-10-CM

## 2020-06-11 DIAGNOSIS — E1151 Type 2 diabetes mellitus with diabetic peripheral angiopathy without gangrene: Secondary | ICD-10-CM

## 2020-06-11 NOTE — Progress Notes (Signed)
Order for wound care supplies Faxed to [facility]

## 2021-01-28 DIAGNOSIS — I10 Essential (primary) hypertension: Secondary | ICD-10-CM

## 2021-01-28 DIAGNOSIS — I361 Nonrheumatic tricuspid (valve) insufficiency: Secondary | ICD-10-CM

## 2021-07-01 IMAGING — MR MRI HEAD WITHOUT CONTRAST
14 of 19 series · 34 of 48 positions shown · non-contrast
Comparison: CT head without contrast 12/16/2014.

CLINICAL DATA: Encephalopathy.  Seizure.

EXAM:
MRI HEAD WITHOUT CONTRAST
TECHNIQUE: Multiplanar, multiecho pulse sequences of the brain and surrounding
structures were obtained without intravenous contrast.

[Series 5: DWI · axial · 3.0mm · 0.88mm/px · z∈[-79,+72]mm · 6 of 104 slices shown (1 of 4)]
[im 1/104]
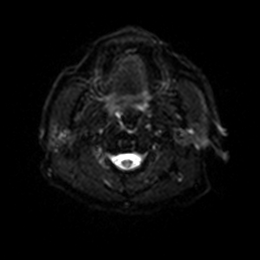
[im 21/104]
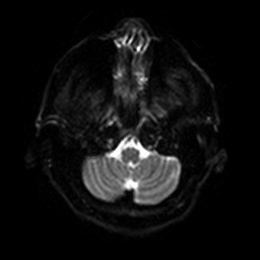
[im 42/104]
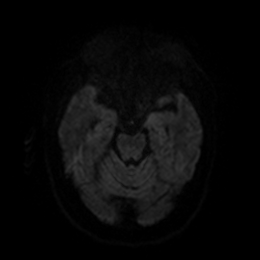
[im 62/104]
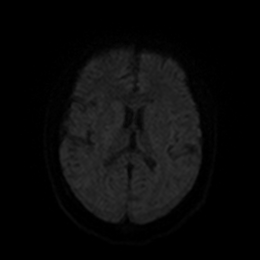
[im 83/104]
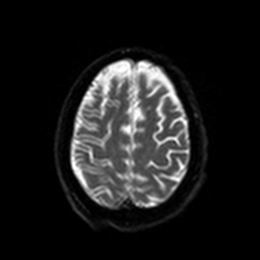
[im 104/104]
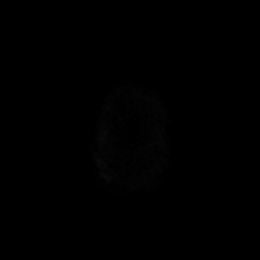

[Series 6: DWI · axial · 3.0mm · 0.88mm/px · z∈[-79,+72]mm · 3 of 52 slices shown (2 of 4)]
[im 1/52]
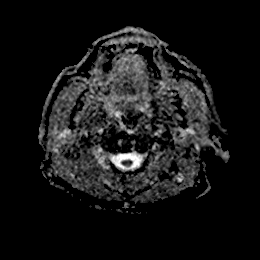
[im 26/52]
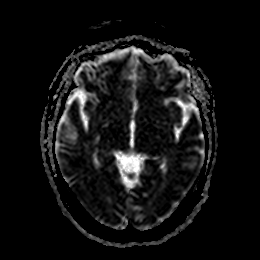
[im 52/52]
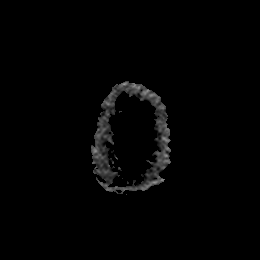

[Series 7: DWI · coronal · 4.0mm · 0.88mm/px · 4 of 76 slices shown (3 of 4)]
[im 1/76]
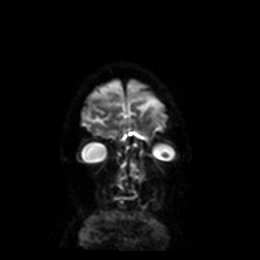
[im 26/76]
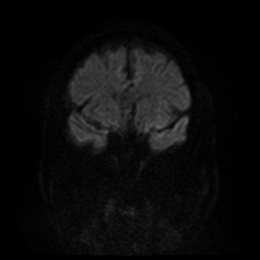
[im 51/76]
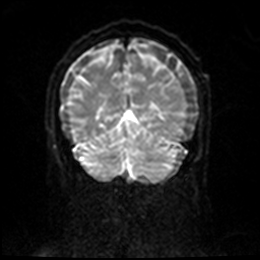
[im 76/76]
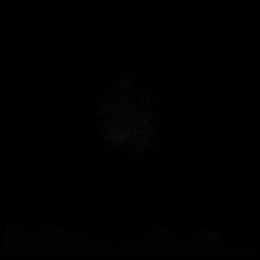

[Series 8: DWI · coronal · 4.0mm · 0.88mm/px · 2 of 38 slices shown (4 of 4)]
[im 1/38]
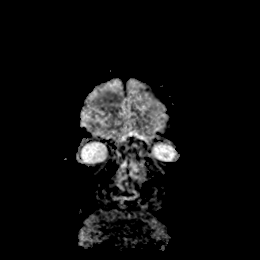
[im 38/38]
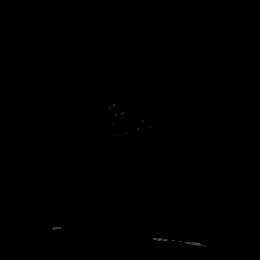

[Series 9: mag_images · axial · 3.0mm · 0.90mm/px · z∈[-87,+88]mm · 3 of 60 slices shown]
[im 1/60]
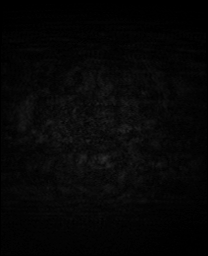
[im 30/60]
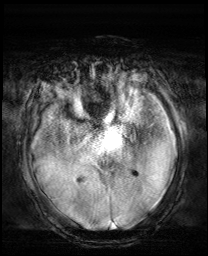
[im 60/60]
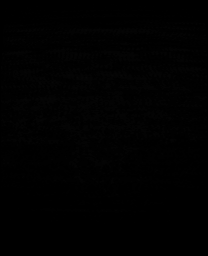

[Series 10: pha_images · axial · 3.0mm · 0.90mm/px · z∈[-78,+67]mm · 3 of 50 slices shown]
[im 1/50]
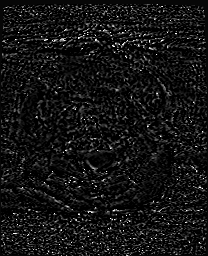
[im 25/50]
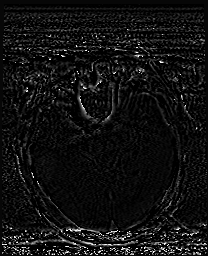
[im 50/50]
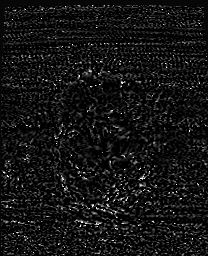

[Series 11: swi_images · axial · 3.0mm · 0.90mm/px · z∈[-87,+88]mm · 3 of 60 slices shown]
[im 1/60]
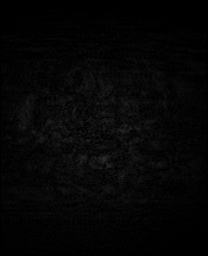
[im 30/60]
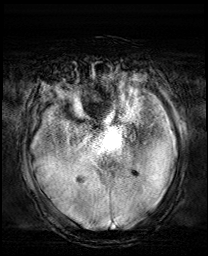
[im 60/60]
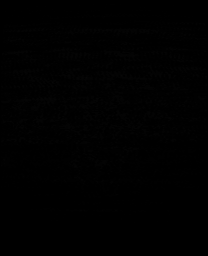

[Series 12: mip_images(sw) · axial · 24.0mm · 0.90mm/px · z∈[-76,+78]mm · 3 of 53 slices shown]
[im 1/53]
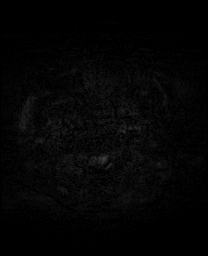
[im 27/53]
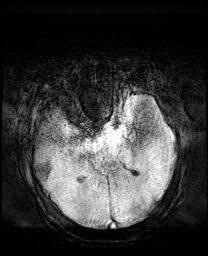
[im 53/53]
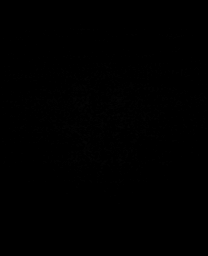

[Series 13: FLAIR · axial · 5.0mm · 0.45mm/px · 1 of 25 slices shown (1 of 2)]
[im 1/25]
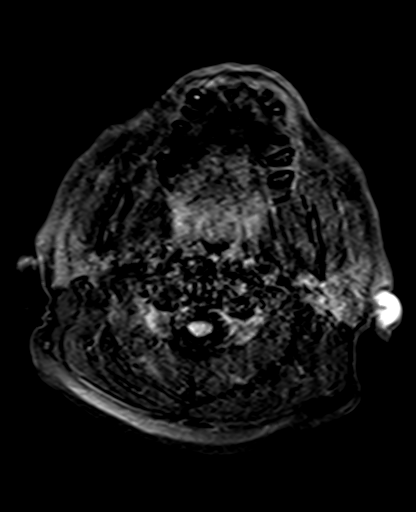

[Series 14: T2 · axial · 5.0mm · 0.72mm/px · 1 of 25 slices shown (1 of 2)]
[im 1/25]
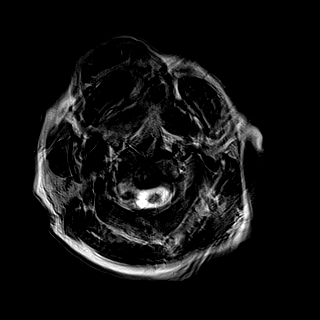

[Series 15: T2 · coronal · 5.0mm · 0.72mm/px · 2 of 29 slices shown (2 of 2)]
[im 1/29]
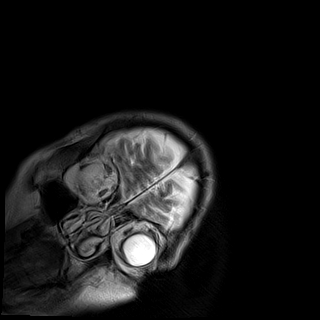
[im 29/29]
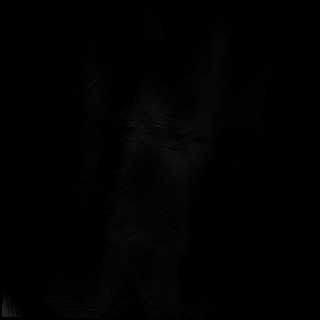

[Series 16: FLAIR · axial · 5.0mm · 0.90mm/px · 1 of 25 slices shown (2 of 2)]
[im 1/25]
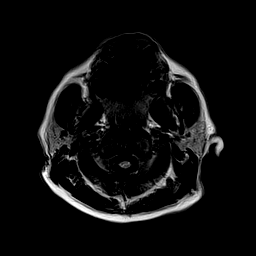

[Series 18: T1 · sagittal · 5.0mm · 0.75mm/px · 1 of 20 slices shown]
[im 1/20]
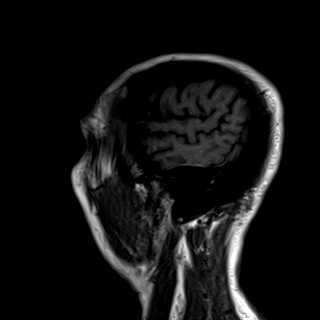

[Series 19: ax hemo · axial · 5.0mm · 0.86mm/px · 1 of 25 slices shown]
[im 1/25]
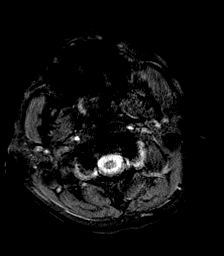

[34 of 48 positions shown; findings below may reference images not displayed]

FINDINGS: Brain: The study is moderately degraded by patient motion. The
diffusion-weighted images demonstrate no acute or subacute
infarction. Scattered white matter changes are mildly advanced for
age. The internal auditory canals are within normal limits. The
brainstem and cerebellum are within normal limits.

Vascular: Flow is present in the major intracranial arteries.

Skull and upper cervical spine: Degenerative changes are present in
the upper cervical spine. There is slight anterolisthesis at C2-3.
Leftward disc osteophyte complex is present at C3-4.

Sinuses/Orbits: The paranasal sinuses and mastoid air cells are
clear. The globes and orbits are within normal limits.
IMPRESSION: 1. No acute or focal abnormality to explain seizures or
encephalopathy.
2. Scattered white matter changes are mildly advanced for age.

## 2021-10-07 ENCOUNTER — Telehealth: Payer: Self-pay | Admitting: Sports Medicine

## 2021-10-07 NOTE — Telephone Encounter (Signed)
Pt's wife notified and understands/reb

## 2021-10-07 NOTE — Telephone Encounter (Signed)
Wife states pt has ankle ulcer x 6 mths that started out small but is now larger and in bad condition. Also has ulcer on left leg now.  You previously were seeing pt for ankle ulcer, did you want to schedule or have them see wound clinic.

## 2021-12-05 ENCOUNTER — Ambulatory Visit: Payer: Medicaid Other | Admitting: Podiatry

## 2022-02-06 ENCOUNTER — Other Ambulatory Visit: Payer: Self-pay | Admitting: Oncology

## 2022-02-06 DIAGNOSIS — D508 Other iron deficiency anemias: Secondary | ICD-10-CM

## 2022-02-07 ENCOUNTER — Inpatient Hospital Stay: Payer: Medicaid Other

## 2022-02-07 ENCOUNTER — Inpatient Hospital Stay: Payer: Medicaid Other | Admitting: Oncology
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
## 2022-02-17 ENCOUNTER — Other Ambulatory Visit: Payer: Medicaid Other

## 2022-02-17 ENCOUNTER — Encounter: Payer: Medicaid Other | Admitting: Oncology

## 2023-01-13 DEATH — deceased
